# Patient Record
Sex: Male | Born: 1935
Health system: Southern US, Community
[De-identification: ages and names within clinical notes are randomized; demographics above are authoritative.]

## PROBLEM LIST (undated history)

## (undated) DIAGNOSIS — G629 Polyneuropathy, unspecified: Secondary | ICD-10-CM

## (undated) DIAGNOSIS — I440 Atrioventricular block, first degree: Secondary | ICD-10-CM

## (undated) DIAGNOSIS — I4891 Unspecified atrial fibrillation: Secondary | ICD-10-CM

## (undated) DIAGNOSIS — A0472 Enterocolitis due to Clostridium difficile, not specified as recurrent: Secondary | ICD-10-CM

## (undated) DIAGNOSIS — E785 Hyperlipidemia, unspecified: Secondary | ICD-10-CM

## (undated) DIAGNOSIS — I2510 Atherosclerotic heart disease of native coronary artery without angina pectoris: Secondary | ICD-10-CM

## (undated) DIAGNOSIS — E119 Type 2 diabetes mellitus without complications: Secondary | ICD-10-CM

## (undated) DIAGNOSIS — M109 Gout, unspecified: Secondary | ICD-10-CM

## (undated) DIAGNOSIS — I10 Essential (primary) hypertension: Secondary | ICD-10-CM

## (undated) DIAGNOSIS — I639 Cerebral infarction, unspecified: Secondary | ICD-10-CM

## (undated) HISTORY — DX: Hyperlipidemia, unspecified: E78.5

## (undated) HISTORY — PX: CORONARY ARTERY BYPASS GRAFT: SHX141

## (undated) HISTORY — DX: Gout, unspecified: M10.9

## (undated) HISTORY — DX: Atrioventricular block, first degree: I44.0

## (undated) HISTORY — PX: KIDNEY STONE SURGERY: SHX686

## (undated) HISTORY — PX: PARATHYROIDECTOMY: SHX19

## (undated) HISTORY — DX: Essential (primary) hypertension: I10

## (undated) HISTORY — DX: Unspecified atrial fibrillation: I48.91

## (undated) HISTORY — DX: Cerebral infarction, unspecified: I63.9

## (undated) HISTORY — PX: OTHER SURGICAL HISTORY: SHX169

## (undated) HISTORY — DX: Polyneuropathy, unspecified: G62.9

## (undated) HISTORY — DX: Type 2 diabetes mellitus without complications: E11.9

---
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
## 1997-11-04 ENCOUNTER — Encounter: Payer: Self-pay | Admitting: Emergency Medicine

## 1997-11-05 ENCOUNTER — Inpatient Hospital Stay (HOSPITAL_COMMUNITY): Admission: EM | Admit: 1997-11-05 | Discharge: 1997-11-05 | Payer: Self-pay | Admitting: Emergency Medicine

## 1997-11-15 ENCOUNTER — Encounter: Admission: RE | Admit: 1997-11-15 | Discharge: 1997-11-15 | Payer: Self-pay | Admitting: Family Medicine

## 2006-10-22 ENCOUNTER — Emergency Department (HOSPITAL_COMMUNITY): Admission: EM | Admit: 2006-10-22 | Discharge: 2006-10-22 | Payer: Self-pay | Admitting: Emergency Medicine

## 2009-08-12 ENCOUNTER — Emergency Department (HOSPITAL_COMMUNITY): Admission: EM | Admit: 2009-08-12 | Discharge: 2009-08-13 | Payer: Self-pay | Admitting: Emergency Medicine

## 2010-04-08 LAB — URINE CULTURE
Colony Count: NO GROWTH
Culture: NO GROWTH

## 2010-04-08 LAB — URINALYSIS, ROUTINE W REFLEX MICROSCOPIC
Bilirubin Urine: NEGATIVE
Glucose, UA: NEGATIVE mg/dL
Ketones, ur: NEGATIVE mg/dL
Leukocytes, UA: NEGATIVE
Nitrite: NEGATIVE
Specific Gravity, Urine: 1.015 (ref 1.005–1.030)
Urobilinogen, UA: 0.2 mg/dL (ref 0.0–1.0)
pH: 5.5 (ref 5.0–8.0)

## 2010-04-08 LAB — DIFFERENTIAL
Lymphocytes Relative: 3 % — ABNORMAL LOW (ref 12–46)
Lymphs Abs: 0.4 10*3/uL — ABNORMAL LOW (ref 0.7–4.0)
Neutrophils Relative %: 94 % — ABNORMAL HIGH (ref 43–77)

## 2010-04-08 LAB — URINE MICROSCOPIC-ADD ON

## 2010-04-08 LAB — CBC
MCV: 89.6 fL (ref 78.0–100.0)
Platelets: 115 10*3/uL — ABNORMAL LOW (ref 150–400)
RBC: 4.84 MIL/uL (ref 4.22–5.81)
WBC: 16.4 10*3/uL — ABNORMAL HIGH (ref 4.0–10.5)

## 2010-04-08 LAB — POCT CARDIAC MARKERS
CKMB, poc: <1 ng/mL — ABNORMAL LOW (ref 1.0–8.0)
Troponin i, poc: <0.05 ng/mL (ref 0.00–0.09)

## 2010-04-08 LAB — GLUCOSE, CAPILLARY: Glucose-Capillary: 226 mg/dL — ABNORMAL HIGH (ref 70–99)

## 2010-10-25 ENCOUNTER — Other Ambulatory Visit (HOSPITAL_COMMUNITY): Payer: Self-pay | Admitting: Nephrology

## 2010-10-25 DIAGNOSIS — N289 Disorder of kidney and ureter, unspecified: Secondary | ICD-10-CM

## 2010-11-20 ENCOUNTER — Ambulatory Visit (HOSPITAL_COMMUNITY)
Admission: RE | Admit: 2010-11-20 | Discharge: 2010-11-20 | Disposition: A | Discharge status: Home / Self Care | Payer: Medicare Other | Source: Ambulatory Visit | Attending: Nephrology | Admitting: Nephrology

## 2010-11-20 DIAGNOSIS — N289 Disorder of kidney and ureter, unspecified: Secondary | ICD-10-CM | POA: Insufficient documentation

## 2012-04-23 ENCOUNTER — Ambulatory Visit: Payer: Self-pay | Admitting: Family Medicine

## 2012-04-29 ENCOUNTER — Other Ambulatory Visit: Payer: Self-pay | Admitting: *Deleted

## 2012-04-29 MED ORDER — FUROSEMIDE 40 MG PO TABS: 40.0000 mg | ORAL_TABLET | Freq: Every day | ORAL | Status: DC

## 2012-05-05 ENCOUNTER — Ambulatory Visit: Payer: Self-pay | Admitting: Family Medicine

## 2012-05-15 ENCOUNTER — Ambulatory Visit: Payer: Self-pay | Admitting: Family Medicine

## 2012-05-28 ENCOUNTER — Telehealth: Payer: Self-pay | Admitting: Pharmacist

## 2012-05-28 MED ORDER — ROSUVASTATIN CALCIUM 20 MG PO TABS: 20.0000 mg | ORAL_TABLET | Freq: Every day | ORAL | Status: DC

## 2012-05-28 NOTE — Telephone Encounter (Signed)
Left samples for Crestor 20mg  take 1 tablet daily at front desk

## 2012-05-29 ENCOUNTER — Other Ambulatory Visit: Payer: Self-pay | Admitting: Family Medicine

## 2012-06-12 ENCOUNTER — Ambulatory Visit (INDEPENDENT_AMBULATORY_CARE_PROVIDER_SITE_OTHER): Payer: Medicare Other | Admitting: Family Medicine

## 2012-06-12 ENCOUNTER — Encounter: Payer: Self-pay | Admitting: Family Medicine

## 2012-06-12 VITALS — BP 100/60 | HR 60 | Temp 97.6°F | Ht 67.25 in | Wt 173.0 lb

## 2012-06-12 DIAGNOSIS — I10 Essential (primary) hypertension: Secondary | ICD-10-CM | POA: Insufficient documentation

## 2012-06-12 DIAGNOSIS — H9193 Unspecified hearing loss, bilateral: Secondary | ICD-10-CM

## 2012-06-12 DIAGNOSIS — G4762 Sleep related leg cramps: Secondary | ICD-10-CM

## 2012-06-12 DIAGNOSIS — E785 Hyperlipidemia, unspecified: Secondary | ICD-10-CM | POA: Insufficient documentation

## 2012-06-12 DIAGNOSIS — H9190 Unspecified hearing loss, unspecified ear: Secondary | ICD-10-CM

## 2012-06-12 DIAGNOSIS — IMO0001 Reserved for inherently not codable concepts without codable children: Secondary | ICD-10-CM

## 2012-06-12 DIAGNOSIS — E1065 Type 1 diabetes mellitus with hyperglycemia: Secondary | ICD-10-CM

## 2012-06-12 LAB — POCT CBC
HCT, POC: 40.8 % — AB (ref 43.5–53.7)
Lymph, poc: 1.5 (ref 0.6–3.4)
MCH, POC: 32.4 pg — AB (ref 27–31.2)
MCV: 92 fL (ref 80–97)
Platelet Count, POC: 133 10*3/uL — AB (ref 142–424)
RBC: 4.4 M/uL — AB (ref 4.69–6.13)

## 2012-06-12 LAB — COMPLETE METABOLIC PANEL WITH GFR
ALT: 10 U/L (ref 0–53)
AST: 13 U/L (ref 0–37)
Creat: 1.98 mg/dL — ABNORMAL HIGH (ref 0.50–1.35)
Total Bilirubin: 0.5 mg/dL (ref 0.3–1.2)

## 2012-06-12 NOTE — Patient Instructions (Addendum)
Continue therapeutic lifestyle changes. Continue current medications. Be careful with climbing so you don't fall. Arrange for an eye exam. Drinking plenty of fluids on a daily basis will help leg cramps at night. We will call you with the results of the labs once they are available. Dr. Lamar Laundry is an audiologist who comes next-door. He can be contacted to check your hearing

## 2012-06-12 NOTE — Progress Notes (Signed)
  Subjective:    Patient ID: Chad [name], male    DOB: 07-24-1931, 79 y.o.   MRN: [id number]  HPI This patient presents for recheck of multiple medical problems. His daughter accompanies the patient today.  Patient Active Problem List   Diagnosis Date Noted  . HTN (hypertension) 06/12/2012  .  hyperlipidemia 06/12/2012  . Diabetes mellitus, insulin dependent (IDDM), uncontrolled 06/12/2012    In addition, he complains of severe leg cramps at night.  The allergies, current medications, past medical history, surgical history, family and social history are reviewed.  Immunizations reviewed.  Health maintenance reviewed.  The following items are outstanding:  possibly the colonoscopy      Review of Systems  Constitutional: Negative.   HENT: Positive for hearing loss.   Eyes: Negative.   Respiratory: Negative.   Cardiovascular: Negative.   Gastrointestinal: Negative.   Endocrine: Negative.   Genitourinary: Negative.   Musculoskeletal: Negative.   Skin: Negative.   Allergic/Immunologic: Negative.   Neurological: Negative.   Hematological: Negative.   Psychiatric/Behavioral: Negative.        Objective:   Physical Exam BP 100/60  Pulse 60  Temp(Src) 97.6 F (36.4 C) (Oral)  Ht 5' 7.25" (1.708 m)  Wt 173 lb (78.472 kg)  BMI 26.9 kg/m2  The patient appeared well nourished and normally developed, alert, calm, and oriented to time and place. Speech, behavior and judgement appear normal. Vital signs as documented.  Head exam is unremarkable. No scleral icterus or pallor noted.  Neck is without jugular venous distension, thyromegally, or carotid bruits. Carotid upstrokes are brisk bilaterally. No cervical adenopathy. Lungs are clear anteriorly and posteriorly to auscultation. Normal respiratory effort. No axillary adenopathy Cardiac exam reveals regular rate and rhythm at 60 per minute. First and second heart sounds normal.  No murmurs, rubs or gallops.  Abdominal  exam reveals normal bowl sounds, no masses, no organomegaly and no aortic enlargement. No inguinal adenopathy. There is no tenderness. Extremities are nonedematous and both femoral and pedal pulses are normal. Mobility was good. Skin without pallor or jaundice.  Warm and dry, without rash. Neurologic exam reveals normal deep tendon reflexes and normal sensation. Diabetic foot exam was normal.          Assessment & Plan:  1. HTN (hypertension) - COMPLETE METABOLIC PANEL WITH GFR - NMR Lipoprofile with Lipids - Vitamin D 25 hydroxy - POCT glycosylated hemoglobin (Hb A1C) - POCT CBC  2.  hyperlipidemia - COMPLETE METABOLIC PANEL WITH GFR - NMR Lipoprofile with Lipids - Vitamin D 25 hydroxy - POCT glycosylated hemoglobin (Hb A1C) - POCT CBC  3. Diabetes mellitus, insulin dependent (IDDM), uncontrolled - COMPLETE METABOLIC PANEL WITH GFR - NMR Lipoprofile with Lipids - Vitamin D 25 hydroxy - POCT glycosylated hemoglobin (Hb A1C) - POCT CBC   4. Nocturnal leg cramps   5.diminished hearing    Patient Instructions  Continue therapeutic lifestyle changes. Continue current medications. Be careful with climbing so you don't fall. Arrange for an eye exam. Drinking plenty of fluids on a daily basis will help leg cramps at night. We will call you with the results of the labs once they are available. Dr. Lamar Laundry is an audiologist who comes next-door. He can be contacted to check your hearing

## 2012-06-13 LAB — VITAMIN D 25 HYDROXY (VIT D DEFICIENCY, FRACTURES): Vit D, 25-Hydroxy: 60 ng/mL (ref 30–89)

## 2012-06-17 LAB — NMR LIPOPROFILE WITH LIPIDS
Cholesterol, Total: 128 mg/dL (ref <200)
HDL Particle Number: 25.3 umol/L — ABNORMAL LOW (ref >=30.5)
HDL-C: 36 mg/dL — ABNORMAL LOW (ref >=40)
LDL (calc): 73 mg/dL (ref <100)
LDL Size: 20.3 nm — ABNORMAL LOW (ref >20.5)
LP-IR Score: 54 — ABNORMAL HIGH (ref <=45)

## 2012-07-10 ENCOUNTER — Other Ambulatory Visit (INDEPENDENT_AMBULATORY_CARE_PROVIDER_SITE_OTHER): Payer: Medicare Other

## 2012-07-10 DIAGNOSIS — R7989 Other specified abnormal findings of blood chemistry: Secondary | ICD-10-CM

## 2012-07-10 LAB — BASIC METABOLIC PANEL WITH GFR
BUN: 37 mg/dL — ABNORMAL HIGH (ref 6–23)
CO2: 27 mEq/L (ref 19–32)
Chloride: 104 mEq/L (ref 96–112)
Creat: 2.04 mg/dL — ABNORMAL HIGH (ref 0.50–1.35)
GFR, Est Non African American: 30 mL/min — ABNORMAL LOW
Glucose, Bld: 99 mg/dL (ref 70–99)

## 2012-08-13 ENCOUNTER — Other Ambulatory Visit: Payer: Self-pay | Admitting: Family Medicine

## 2012-08-15 ENCOUNTER — Other Ambulatory Visit: Payer: Self-pay | Admitting: Family Medicine

## 2012-08-25 ENCOUNTER — Telehealth: Payer: Self-pay | Admitting: Family Medicine

## 2012-08-25 NOTE — Telephone Encounter (Signed)
Left samples at front desk for crestor and levemir - patient's wife instructed to use levemir same as lantus - 40 units daily

## 2012-09-12 ENCOUNTER — Other Ambulatory Visit: Payer: Self-pay | Admitting: Family Medicine

## 2012-10-06 ENCOUNTER — Telehealth: Payer: Self-pay | Admitting: Pharmacist

## 2012-10-06 MED ORDER — ROSUVASTATIN CALCIUM 20 MG PO TABS: 20.0000 mg | ORAL_TABLET | Freq: Every day | ORAL | Status: DC

## 2012-10-06 MED ORDER — INSULIN GLARGINE 100 UNIT/ML ~~LOC~~ SOLN: 40.0000 [IU] | Freq: Every day | SUBCUTANEOUS | Status: DC

## 2012-10-06 NOTE — Telephone Encounter (Signed)
Samples left in lab refrig and at front desk.

## 2012-10-11 ENCOUNTER — Other Ambulatory Visit: Payer: Self-pay | Admitting: Family Medicine

## 2012-10-14 NOTE — Telephone Encounter (Signed)
This is okay to refill for 6 months 

## 2012-10-14 NOTE — Telephone Encounter (Signed)
Last seen DWM  06/12/12 

## 2012-10-14 NOTE — Telephone Encounter (Signed)
Last seen 06/12/12  DWM  If approved route to nurse and phone into [facility]

## 2012-10-15 NOTE — Telephone Encounter (Signed)
[facility] 

## 2012-10-22 ENCOUNTER — Ambulatory Visit: Payer: BC Managed Care – HMO | Admitting: Family Medicine

## 2012-10-28 ENCOUNTER — Other Ambulatory Visit: Payer: Self-pay | Admitting: Nurse Practitioner

## 2012-10-30 ENCOUNTER — Other Ambulatory Visit: Payer: Self-pay | Admitting: Pharmacist

## 2012-10-30 NOTE — Telephone Encounter (Signed)
TAMMY CAN YOU HELP WITH THIS PLEASE. I WANT TO MAKE SURE WE ORDER CORRECT QUANTITY AND DIRECTIONS. THANKS.

## 2012-11-12 ENCOUNTER — Ambulatory Visit (INDEPENDENT_AMBULATORY_CARE_PROVIDER_SITE_OTHER): Payer: Medicare Other

## 2012-11-12 DIAGNOSIS — Z23 Encounter for immunization: Secondary | ICD-10-CM

## 2012-11-13 ENCOUNTER — Encounter (INDEPENDENT_AMBULATORY_CARE_PROVIDER_SITE_OTHER): Payer: Self-pay

## 2012-11-13 ENCOUNTER — Encounter: Payer: Self-pay | Admitting: Family Medicine

## 2012-11-13 ENCOUNTER — Ambulatory Visit (INDEPENDENT_AMBULATORY_CARE_PROVIDER_SITE_OTHER): Payer: Medicare Other | Admitting: Family Medicine

## 2012-11-13 ENCOUNTER — Ambulatory Visit (INDEPENDENT_AMBULATORY_CARE_PROVIDER_SITE_OTHER): Payer: Medicare Other

## 2012-11-13 ENCOUNTER — Other Ambulatory Visit: Payer: Medicare Other

## 2012-11-13 VITALS — BP 105/58 | HR 66 | Temp 98.5°F | Ht 67.5 in | Wt 173.0 lb

## 2012-11-13 DIAGNOSIS — I10 Essential (primary) hypertension: Secondary | ICD-10-CM

## 2012-11-13 DIAGNOSIS — IMO0001 Reserved for inherently not codable concepts without codable children: Secondary | ICD-10-CM

## 2012-11-13 DIAGNOSIS — I2510 Atherosclerotic heart disease of native coronary artery without angina pectoris: Secondary | ICD-10-CM

## 2012-11-13 DIAGNOSIS — I7090 Unspecified atherosclerosis: Secondary | ICD-10-CM

## 2012-11-13 DIAGNOSIS — G4733 Obstructive sleep apnea (adult) (pediatric): Secondary | ICD-10-CM

## 2012-11-13 DIAGNOSIS — E785 Hyperlipidemia, unspecified: Secondary | ICD-10-CM

## 2012-11-13 DIAGNOSIS — E559 Vitamin D deficiency, unspecified: Secondary | ICD-10-CM

## 2012-11-13 DIAGNOSIS — Z79899 Other long term (current) drug therapy: Secondary | ICD-10-CM

## 2012-11-13 DIAGNOSIS — Z23 Encounter for immunization: Secondary | ICD-10-CM

## 2012-11-13 DIAGNOSIS — R5381 Other malaise: Secondary | ICD-10-CM

## 2012-11-13 DIAGNOSIS — E1065 Type 1 diabetes mellitus with hyperglycemia: Secondary | ICD-10-CM

## 2012-11-13 LAB — POCT GLYCOSYLATED HEMOGLOBIN (HGB A1C): Hemoglobin A1C: 7.5

## 2012-11-13 LAB — POCT CBC
Granulocyte percent: 71.5 %G (ref 37–80)
MPV: 8 fL (ref 0–99.8)
POC Granulocyte: 4.6 (ref 2–6.9)
Platelet Count, POC: 146 10*3/uL (ref 142–424)
RBC: 4.7 M/uL (ref 4.69–6.13)

## 2012-11-13 NOTE — Progress Notes (Signed)
Subjective:    Patient ID: Chad [name], male    DOB: 1931/05/22, 79 y.o.   MRN: [id number]  HPI Pt here for follow up and management of chronic medical problems. Has a history of multiple medical problems including those below plus ASCVD with a history of a CABG. He also has a history of a TIA  and elevated creatinine     Patient Active Problem List   Diagnosis Date Noted  . HTN (hypertension) 06/12/2012  .  hyperlipidemia 06/12/2012  . Diabetes mellitus, insulin dependent (IDDM), uncontrolled 06/12/2012   Outpatient Encounter Prescriptions as of 11/13/2012  Medication Sig Dispense Refill  . allopurinol (ZYLOPRIM) 100 MG tablet Take 100 mg by mouth daily.      Marland Kitchen aspirin 81 MG tablet Take 81 mg by mouth daily.      Marland Kitchen atenolol (TENORMIN) 50 MG tablet TAKE 1 TABLET BY MOUTH DAILY  30 tablet  3  . Cholecalciferol (VITAMIN D3) 3000 UNITS TABS Take 2,000 Units by mouth daily.      . fish oil-omega-3 fatty acids 1000 MG capsule Take 1 g by mouth daily.      . furosemide (LASIX) 40 MG tablet TAKE 1 TABLET (40 MG TOTAL) BY MOUTH DAILY.  30 tablet  3  . Insulin Glargine (LANTUS SOLOSTAR) 100 UNIT/ML SOPN Inject 40 Units into the skin at bedtime.  15 mL  2  . lisinopril (PRINIVIL,ZESTRIL) 40 MG tablet TAKE ONE TABLET BY MOUTH EVERY DAY  30 tablet  4  . LORazepam (ATIVAN) 2 MG tablet TAKE  (1)  TABLET  THREE TIMES DAILY AS NEEDED.  90 tablet  0  . ONE TOUCH ULTRA TEST test strip USE TO CHECK BLOOD SUGAR 2 TO 3 TIMES DAILY  100 each  2  . rosuvastatin (CRESTOR) 20 MG tablet Take 1 tablet (20 mg total) by mouth daily.  42 tablet  0   No facility-administered encounter medications on file as of 11/13/2012.    Review of Systems  Constitutional: Negative.   HENT: Negative.   Eyes: Negative.   Respiratory: Negative.   Cardiovascular: Negative.   Gastrointestinal: Negative.   Endocrine: Negative.   Genitourinary: Negative.   Musculoskeletal: Negative.   Skin: Negative.    Allergic/Immunologic: Negative.   Neurological: Negative.   Hematological: Negative.   Psychiatric/Behavioral: Negative.        Objective:   Physical Exam  Nursing note and vitals reviewed. Constitutional: He is oriented to person, place, and time. He appears well-developed and well-nourished. No distress.  Pleasant and cooperative  HENT:  Head: Normocephalic and atraumatic.  Right Ear: External ear normal.  Left Ear: External ear normal.  Nose: Nose normal.  Mouth/Throat: Oropharynx is clear and moist. No oropharyngeal exudate.  Wears upper and lower dentures  Eyes: Conjunctivae and EOM are normal. Pupils are equal, round, and reactive to light. Right eye exhibits no discharge. Left eye exhibits no discharge. No scleral icterus.  Neck: Normal range of motion. Neck supple. No tracheal deviation present. No thyromegaly present.  Cardiovascular: Normal rate, regular rhythm, normal heart sounds and intact distal pulses.  Exam reveals no gallop and no friction rub.   No murmur heard. At 72 per minute  Pulmonary/Chest: Effort normal and breath sounds normal. No respiratory distress. He has no wheezes. He has no rales. He exhibits no tenderness.  Abdominal: Soft. Bowel sounds are normal. He exhibits no mass. There is no tenderness. There is no rebound and no guarding.  Musculoskeletal: Normal range of  motion. He exhibits no edema and no tenderness.  Varicose veins on both lower extremity  Lymphadenopathy:    He has no cervical adenopathy.  Neurological: He is alert and oriented to person, place, and time. He has normal reflexes.  Skin: Skin is warm and dry. No rash noted. No erythema. No pallor.  Psychiatric: He has a normal mood and affect. His behavior is normal. Judgment and thought content normal.   BP 105/58  Pulse 66  Temp(Src) 98.5 F (36.9 C) (Oral)  Ht 5' 7.5" (1.715 m)  Wt 173 lb (78.472 kg)  BMI 26.68 kg/m2 Diabetic foot exam was done today  Results for orders placed  in visit on 11/13/12  POCT CBC      Result Value Range   WBC 6.4  4.6 - 10.2 K/uL   Lymph, poc 1.4  0.6 - 3.4   POC LYMPH PERCENT 22.6  10 - 50 %L   MID (cbc)    0 - 0.9   POC MID %    0 - 12 %M   POC Granulocyte 4.6  2 - 6.9   Granulocyte percent 71.5  37 - 80 %G   RBC 4.7  4.69 - 6.13 M/uL   Hemoglobin 13.8 (*) 14.1 - 18.1 g/dL   HCT, POC 16.1 (*) 09.6 - 53.7 %   MCV 91.6  80 - 97 fL   MCH, POC 29.5  27 - 31.2 pg   MCHC 32.3  31.8 - 35.4 g/dL   RDW, POC 04.5     Platelet Count, POC 146.0  142 - 424 K/uL   MPV 8.0  0 - 99.8 fL  POCT GLYCOSYLATED HEMOGLOBIN (HGB A1C)      Result Value Range   Hemoglobin A1C 7.5%      [facility] reading (PRIMARY) by  Dr. Christell Constant: Chest x-ray--  degenerative changes and atherosclerotic aorta                                      Assessment & Plan:   1.  hyperlipidemia   2. Diabetes mellitus, insulin dependent (IDDM), uncontrolled   3. HTN (hypertension)   4. ASCVD (arteriosclerotic cardiovascular disease)   5. Obstructive sleep apnea   6. Need for pneumococcal vaccination    Orders Placed This Encounter  Procedures  . DG Chest 2 View    Standing Status: Future     Number of Occurrences: 1     Standing Expiration Date: 01/13/2014    Order Specific Question:  Reason for Exam (SYMPTOM  OR DIAGNOSIS REQUIRED)    Answer:  htn    Order Specific Question:  Preferred imaging location?    Answer:  Internal  . Pneumococcal conjugate vaccine 13-valent less than 5yo IM   No orders of the defined types were placed in this encounter.   Patient Instructions  Continue current medications. Continue good therapeutic lifestyle changes.  Fall precautions discussed with patient. Follow up as planned and earlier as needed.  Patient needs to do better with blood sugar control and exercise because of the hemoglobin A1c which was 7.5%. This number should be less than 7%. We will call you with the remainder of the lab work results once available We will call  you with the results of the chest x-ray once available You will receive your Prevnar vaccine today She was informed today of his CBC and hemoglobin A1c result  Arrie Senate MD

## 2012-11-13 NOTE — Patient Instructions (Addendum)
Continue current medications. Continue good therapeutic lifestyle changes.  Fall precautions discussed with patient. Follow up as planned and earlier as needed.  Patient needs to do better with blood sugar control and exercise because of the hemoglobin A1c which was 7.5%. This number should be less than 7%. We will call you with the remainder of the lab work results once available We will call you with the results of the chest x-ray once available You will receive your Prevnar vaccine today She was informed today of his CBC and hemoglobin A1c resultPneumococcal Vaccine, Polyvalent suspension for injection What is this medicine? PNEUMOCOCCAL VACCINE, POLYVALENT (NEU mo KOK al vak SEEN, pol ee VEY luhnt) is a vaccine to prevent pneumococcus bacteria infection. These bacteria are a major cause of ear infections, 'Strep throat' infections, and serious pneumonia, meningitis, or blood infections worldwide. These vaccines help the body to produce antibodies (protective substances) that help your body defend against these bacteria. This vaccine is recommended for infants and young children. This vaccine will not treat an infection. This medicine may be used for other purposes; ask your health care provider or pharmacist if you have questions. What should I tell my health care provider before I take this medicine? They need to know if you have any of these conditions: -bleeding problems -fever -immune system problems -low platelet count in the blood -seizures -an unusual or allergic reaction to pneumococcal vaccine, diphtheria toxoid, other vaccines, latex, other medicines, foods, dyes, or preservatives -pregnant or trying to get pregnant -breast-feeding How should I use this medicine? This vaccine is for injection into a muscle. It is given by a health care professional. A copy of Vaccine Information Statements will be given before each vaccination. Read this sheet carefully each time. The sheet may  change frequently. Talk to your pediatrician regarding the use of this medicine in children. While this drug may be prescribed for children as young as 72 weeks old for selected conditions, precautions do apply. Overdosage: If you think you have taken too much of this medicine contact a poison control center or emergency room at once. NOTE: This medicine is only for you. Do not share this medicine with others. What if I miss a dose? It is important not to miss your dose. Call your doctor or health care professional if you are unable to keep an appointment. What may interact with this medicine? -medicines for cancer chemotherapy -medicines that suppress your immune function -medicines that treat or prevent blood clots like warfarin, enoxaparin, and dalteparin -steroid medicines like prednisone or cortisone This list may not describe all possible interactions. Give your health care provider a list of all the medicines, herbs, non-prescription drugs, or dietary supplements you use. Also tell them if you smoke, drink alcohol, or use illegal drugs. Some items may interact with your medicine. What should I watch for while using this medicine? Mild fever and pain should go away in 3 days or less. Report any unusual symptoms to your doctor or health care professional. What side effects may I notice from receiving this medicine? Side effects that you should report to your doctor or health care professional as soon as possible: -allergic reactions like skin rash, itching or hives, swelling of the face, lips, or tongue -breathing problems -confused -fever over 102 degrees F -pain, tingling, numbness in the hands or feet -seizures -unusual bleeding or bruising -unusual muscle weakness Side effects that usually do not require medical attention (report to your doctor or health care professional if they  continue or are bothersome): -aches and pains -diarrhea -fever of 102 degrees F or  less -headache -irritable -loss of appetite -pain, tender at site where injected -trouble sleeping This list may not describe all possible side effects. Call your doctor for medical advice about side effects. You may report side effects to FDA at 1-800-FDA-1088. Where should I keep my medicine? This does not apply. This vaccine is given in a clinic, pharmacy, doctor's office, or other health care setting and will not be stored at home. NOTE: This sheet is a summary. It may not cover all possible information. If you have questions about this medicine, talk to your doctor, pharmacist, or health care provider.  2013, [employer]. (03/23/2008 10:17:22 AM)

## 2012-11-13 NOTE — Progress Notes (Signed)
prevnar given-pt tolerated well.

## 2012-11-15 LAB — NMR, LIPOPROFILE
Cholesterol: 116 mg/dL (ref <200)
HDL Cholesterol by NMR: 35 mg/dL — ABNORMAL LOW (ref >=40)
HDL Particle Number: 23.7 umol/L — ABNORMAL LOW (ref >=30.5)
LDL Particle Number: 945 nmol/L (ref <1000)
LDLC SERPL CALC-MCNC: 65 mg/dL (ref <100)

## 2012-11-15 LAB — BMP8+EGFR
BUN: 29 mg/dL — ABNORMAL HIGH (ref 8–27)
CO2: 24 mmol/L (ref 18–29)
Chloride: 99 mmol/L (ref 97–108)
Glucose: 127 mg/dL — ABNORMAL HIGH (ref 65–99)
Potassium: 4.9 mmol/L (ref 3.5–5.2)

## 2012-11-15 LAB — HEPATIC FUNCTION PANEL
Albumin: 4.1 g/dL (ref 3.5–4.8)
Total Protein: 6.2 g/dL (ref 6.0–8.5)

## 2012-11-15 LAB — VITAMIN D 25 HYDROXY (VIT D DEFICIENCY, FRACTURES): Vit D, 25-Hydroxy: 67.9 ng/mL (ref 30.0–100.0)

## 2012-12-05 ENCOUNTER — Telehealth: Payer: Self-pay | Admitting: Family Medicine

## 2012-12-05 NOTE — Telephone Encounter (Signed)
Patient advised not to use ibuprofen due to kidney functions being elevated but to try tylenol and if that doesn't help to call us back on Monday.

## 2012-12-08 ENCOUNTER — Telehealth: Payer: Self-pay | Admitting: Family Medicine

## 2012-12-09 ENCOUNTER — Encounter: Payer: Self-pay | Admitting: Family Medicine

## 2012-12-09 ENCOUNTER — Ambulatory Visit (INDEPENDENT_AMBULATORY_CARE_PROVIDER_SITE_OTHER): Payer: Medicare Other | Admitting: Family Medicine

## 2012-12-09 ENCOUNTER — Ambulatory Visit (INDEPENDENT_AMBULATORY_CARE_PROVIDER_SITE_OTHER): Payer: Medicare Other

## 2012-12-09 VITALS — BP 124/68 | HR 68 | Temp 96.5°F | Ht 67.5 in | Wt 173.0 lb

## 2012-12-09 DIAGNOSIS — M25559 Pain in unspecified hip: Secondary | ICD-10-CM

## 2012-12-09 DIAGNOSIS — M25551 Pain in right hip: Secondary | ICD-10-CM

## 2012-12-09 NOTE — Progress Notes (Signed)
Subjective:    Patient ID: Chad [name], male    DOB: 05-08-1931, 79 y.o.   MRN: [id number]  HPI Patient here today for right hip pain, pain radiates down right leg. Patient comes to the visit today with his daughter, Chad [name]. He first noticed this pain in the right hip to 3 weeks ago when he was sitting working on some kind of project. Since that time has been hurting especially with arising in the morning and beginning to walk and with arising from sitting positions and beginning to move. He says he cannot find a spot that is point tender. He does not recall any history of any injury. He indicates that the pain seems to get better with walking.     Patient Active Problem List   Diagnosis Date Noted  . ASCVD (arteriosclerotic cardiovascular disease) 11/13/2012  . Obstructive sleep apnea 11/13/2012  . HTN (hypertension) 06/12/2012  .  hyperlipidemia 06/12/2012  . Diabetes mellitus, insulin dependent (IDDM), uncontrolled 06/12/2012   Outpatient Encounter Prescriptions as of 12/09/2012  Medication Sig  . allopurinol (ZYLOPRIM) 100 MG tablet Take 100 mg by mouth daily.  Marland Kitchen aspirin 81 MG tablet Take 81 mg by mouth daily.  Marland Kitchen atenolol (TENORMIN) 50 MG tablet TAKE 1 TABLET BY MOUTH DAILY  . Cholecalciferol (VITAMIN D3) 3000 UNITS TABS Take 2,000 Units by mouth daily.  . fish oil-omega-3 fatty acids 1000 MG capsule Take 1 g by mouth daily.  . furosemide (LASIX) 40 MG tablet TAKE 1 TABLET (40 MG TOTAL) BY MOUTH DAILY.  Marland Kitchen Insulin Glargine (LANTUS SOLOSTAR) 100 UNIT/ML SOPN Inject 40 Units into the skin at bedtime.  Marland Kitchen lisinopril (PRINIVIL,ZESTRIL) 40 MG tablet TAKE ONE TABLET BY MOUTH EVERY DAY  . LORazepam (ATIVAN) 2 MG tablet TAKE  (1)  TABLET  THREE TIMES DAILY AS NEEDED.  Marland Kitchen ONE TOUCH ULTRA TEST test strip USE TO CHECK BLOOD SUGAR 2 TO 3 TIMES DAILY  . rosuvastatin (CRESTOR) 20 MG tablet Take 1 tablet (20 mg total) by mouth daily.    Review of Systems  Constitutional: Negative.   HENT:  Negative.   Eyes: Negative.   Respiratory: Negative.   Cardiovascular: Negative.   Gastrointestinal: Negative.   Endocrine: Negative.   Genitourinary: Negative.   Musculoskeletal: Positive for arthralgias (right hip( pain).  Skin: Negative.   Allergic/Immunologic: Negative.   Neurological: Negative.   Hematological: Negative.   Psychiatric/Behavioral: Negative.        Objective:   Physical Exam  Nursing note and vitals reviewed. Constitutional: He is oriented to person, place, and time. He appears well-developed and well-nourished. No distress.  HENT:  Head: Normocephalic.  Neck: Normal range of motion.  Musculoskeletal: Normal range of motion. He exhibits no edema and no tenderness.  No plain tenderness about the hip joint. Fairly good range of motion.  Neurological: He is alert and oriented to person, place, and time. He has normal reflexes.  Skin: Skin is warm and dry. No rash noted.  Psychiatric: He has a normal mood and affect. His behavior is normal. Judgment and thought content normal.   BP 124/68  Pulse 68  Temp(Src) 96.5 F (35.8 C) (Oral)  Ht 5' 7.5" (1.715 m)  Wt 173 lb (78.472 kg)  BMI 26.68 kg/m2  [facility] reading (PRIMARY) by  Dr.Cordelia Bessinger--degenerative changes right hip  Recent lab work reviewed indicates he has an elevated creatinine which had improved at the last visit. And of course he also has diabetes. This will limit use of steroids and anti-inflammatory medicine.     Assessment & Plan:   1. Right hip pain    Patient Instructions               Hip Pain The hips join the upper legs to the lower pelvis. The bones, cartilage, tendons, and muscles of the hip joint perform a lot of work each day holding your body weight and allowing you to move around. Hip pain is a common symptom. It can range from a minor ache to severe pain on 1 or both hips. Pain may be felt on the inside of the hip joint near the groin, or the  outside near the buttocks and upper thigh. There may be swelling or stiffness as well. It occurs more often when a person walks or performs activity. There are many reasons hip pain can develop. CAUSES  It is important to work with your caregiver to identify the cause since many conditions can impact the bones, cartilage, muscles, and tendons of the hips. Causes for hip pain include:  Broken (fractured) bones.  Separation of the thighbone from the hip socket (dislocation).  Torn cartilage of the hip joint.  Swelling (inflammation) of a tendon (tendonitis), the sac within the hip joint (bursitis), or a joint.  A weakening in the abdominal wall (hernia), affecting the nerves to the hip.  Arthritis in the hip joint or lining of the hip joint.  Pinched nerves in the back, hip, or upper thigh.  A bulging disc in the spine (herniated disc).  Rarely, bone infection or cancer. DIAGNOSIS  The location of your hip pain will help your caregiver understand what may be causing the pain. A diagnosis is based on your medical history, your symptoms, results from your physical exam, and results from diagnostic tests. Diagnostic tests may include X-ray exams, a computerized magnetic scan (magnetic resonance imaging, MRI), or bone scan. TREATMENT  Treatment will depend on the cause of your hip pain. Treatment may include:  Limiting activities and resting until symptoms improve.  Crutches or other walking supports (a cane or brace).  Ice, elevation, and compression.  Physical therapy or home exercises.  Shoe inserts or special shoes.  Losing weight.  Medications to reduce pain.  Undergoing surgery. HOME CARE INSTRUCTIONS   Only take over-the-counter or prescription medicines for pain, discomfort, or fever as directed by your caregiver.  Put ice on the injured area:  Put ice in a plastic bag.  Place a towel between your skin and the bag.  Leave the ice on for 15-20 minutes at a time,  03-04 times a day.  Keep your leg raised (elevated) when possible to lessen swelling.  Avoid activities that cause pain.  Follow specific exercises as directed by your caregiver.  Sleep with a pillow between your legs on your most comfortable side.  Record how often you have hip pain, the location of the pain, and what it feels like. This information may be helpful to you and your caregiver.  Ask your caregiver about returning to work or sports and whether you should drive.  Follow up with your caregiver for further exams, therapy, or testing as directed. SEEK MEDICAL CARE IF:   Your pain or swelling continues or worsens after 1 week.  You are feeling unwell or have chills.  You have increasing  difficulty with walking.  You have a loss of sensation or other new symptoms.  You have questions or concerns. SEEK IMMEDIATE MEDICAL CARE IF:   You cannot put weight on the affected hip.  You have fallen.  You have a sudden increase in pain and swelling in your hip.  You have a fever. MAKE SURE YOU:   Understand these instructions.  Will watch your condition.  Will get help right away if you are not doing well or get worse. Document Released: 06/28/2009 Document Revised: 04/02/2011 Document Reviewed: 06/28/2009 [facility] Patient Information 2014 [place], [place].  Take extra strength Tylenol as needed for pain Use warm wet compresses 20 minutes 3 or 4 times daily Be careful and did not calm and did not put yourself at risk for falling Call back in one week if not better we will arrange for physical therapy next door Do not use a heating pad   Nyra Capes MD

## 2012-12-09 NOTE — Patient Instructions (Addendum)
Hip Pain The hips join the upper legs to the lower pelvis. The bones, cartilage, tendons, and muscles of the hip joint perform a lot of work each day holding your body weight and allowing you to move around. Hip pain is a common symptom. It can range from a minor ache to severe pain on 1 or both hips. Pain may be felt on the inside of the hip joint near the groin, or the outside near the buttocks and upper thigh. There may be swelling or stiffness as well. It occurs more often when a person walks or performs activity. There are many reasons hip pain can develop. CAUSES  It is important to work with your caregiver to identify the cause since many conditions can impact the bones, cartilage, muscles, and tendons of the hips. Causes for hip pain include:  Broken (fractured) bones.  Separation of the thighbone from the hip socket (dislocation).  Torn cartilage of the hip joint.  Swelling (inflammation) of a tendon (tendonitis), the sac within the hip joint (bursitis), or a joint.  A weakening in the abdominal wall (hernia), affecting the nerves to the hip.  Arthritis in the hip joint or lining of the hip joint.  Pinched nerves in the back, hip, or upper thigh.  A bulging disc in the spine (herniated disc).  Rarely, bone infection or cancer. DIAGNOSIS  The location of your hip pain will help your caregiver understand what may be causing the pain. A diagnosis is based on your medical history, your symptoms, results from your physical exam, and results from diagnostic tests. Diagnostic tests may include X-ray exams, a computerized magnetic scan (magnetic resonance imaging, MRI), or bone scan. TREATMENT  Treatment will depend on the cause of your hip pain. Treatment may include:  Limiting activities and resting until symptoms improve.  Crutches or other walking supports (a cane or brace).  Ice, elevation, and compression.  Physical therapy or home exercises.  Shoe  inserts or special shoes.  Losing weight.  Medications to reduce pain.  Undergoing surgery. HOME CARE INSTRUCTIONS   Only take over-the-counter or prescription medicines for pain, discomfort, or fever as directed by your caregiver.  Put ice on the injured area:  Put ice in a plastic bag.  Place a towel between your skin and the bag.  Leave the ice on for 15-20 minutes at a time, 03-04 times a day.  Keep your leg raised (elevated) when possible to lessen swelling.  Avoid activities that cause pain.  Follow specific exercises as directed by your caregiver.  Sleep with a pillow between your legs on your most comfortable side.  Record how often you have hip pain, the location of the pain, and what it feels like. This information may be helpful to you and your caregiver.  Ask your caregiver about returning to work or sports and whether you should drive.  Follow up with your caregiver for further exams, therapy, or testing as directed. SEEK MEDICAL CARE IF:   Your pain or swelling continues or worsens after 1 week.  You are feeling unwell or have chills.  You have increasing difficulty with walking.  You have a loss of sensation or other new symptoms.  You have questions or concerns. SEEK IMMEDIATE MEDICAL CARE IF:   You cannot put weight on the affected hip.  You have fallen.  You have a sudden increase in pain and swelling in your hip.  You  have a fever. MAKE SURE YOU:   Understand these instructions.  Will watch your condition.  Will get help right away if you are not doing well or get worse. Document Released: 06/28/2009 Document Revised: 04/02/2011 Document Reviewed: 06/28/2009 [facility] Patient Information 2014 [place], [place].  Take extra strength Tylenol as needed for pain Use warm wet compresses 20 minutes 3 or 4 times daily Be careful and did not calm and did not put yourself at risk for falling Call back in one week if not better we will arrange  for physical therapy next door Do not use a heating pad

## 2012-12-12 ENCOUNTER — Other Ambulatory Visit: Payer: Self-pay | Admitting: Nurse Practitioner

## 2012-12-17 ENCOUNTER — Telehealth: Payer: Self-pay | Admitting: Family Medicine

## 2012-12-17 NOTE — Telephone Encounter (Signed)
FYI

## 2012-12-22 NOTE — Telephone Encounter (Signed)
Patient's glucometer just needed battery replaced.  They took to [facility] last week and had changed.

## 2013-01-05 ENCOUNTER — Telehealth: Payer: Self-pay | Admitting: Family Medicine

## 2013-01-05 NOTE — Telephone Encounter (Signed)
Patient want samples of insulin pen needles if available - left #15 up front.

## 2013-01-05 NOTE — Telephone Encounter (Signed)
Called patient - he did not have question about glucometer this question was taken care of but he would like to see if we have any samples of Lantus or Crestor. #1 Levemir pen left at front desk - explained to take same as lantus #28 Crestor 10mg  left ast front desk - explained ot take 2 tablets to equal 20mg  daily.

## 2013-01-09 ENCOUNTER — Other Ambulatory Visit: Payer: Self-pay | Admitting: Family Medicine

## 2013-02-10 NOTE — Progress Notes (Signed)
Pt came in for labs only 

## 2013-02-18 ENCOUNTER — Telehealth: Payer: Self-pay | Admitting: Pharmacist

## 2013-02-18 NOTE — Telephone Encounter (Signed)
Left #1 lantus pen and #28 Crestor 20mg  tablet samples for patietn to pick up - pt notified

## 2013-03-09 ENCOUNTER — Telehealth: Payer: Self-pay | Admitting: Family Medicine

## 2013-03-09 ENCOUNTER — Other Ambulatory Visit: Payer: Self-pay | Admitting: *Deleted

## 2013-03-09 MED ORDER — GLUCOSE BLOOD VI STRP: ORAL_STRIP | Status: DC

## 2013-03-09 NOTE — Telephone Encounter (Signed)
Daughter aware- med sent

## 2013-03-16 ENCOUNTER — Ambulatory Visit: Payer: Medicare Other | Admitting: Family Medicine

## 2013-03-27 NOTE — Telephone Encounter (Signed)
JAMIE B TOOK CARE OF PT.Marland Kitchen..Marland Kitchen

## 2013-04-06 ENCOUNTER — Telehealth: Payer: Self-pay | Admitting: Pharmacist

## 2013-04-06 NOTE — Telephone Encounter (Signed)
No lantus sample available.  #28 crestor 20mg  left at front desk.  Patient's wife notifed.

## 2013-04-13 ENCOUNTER — Encounter: Payer: Self-pay | Admitting: Family Medicine

## 2013-04-13 ENCOUNTER — Ambulatory Visit (INDEPENDENT_AMBULATORY_CARE_PROVIDER_SITE_OTHER): Payer: Medicare Other | Admitting: Family Medicine

## 2013-04-13 VITALS — BP 103/58 | HR 72 | Temp 96.7°F | Ht 67.5 in | Wt 172.0 lb

## 2013-04-13 DIAGNOSIS — E119 Type 2 diabetes mellitus without complications: Secondary | ICD-10-CM

## 2013-04-13 DIAGNOSIS — E1065 Type 1 diabetes mellitus with hyperglycemia: Secondary | ICD-10-CM

## 2013-04-13 DIAGNOSIS — IMO0002 Reserved for concepts with insufficient information to code with codable children: Secondary | ICD-10-CM

## 2013-04-13 DIAGNOSIS — IMO0001 Reserved for inherently not codable concepts without codable children: Secondary | ICD-10-CM

## 2013-04-13 DIAGNOSIS — Z794 Long term (current) use of insulin: Secondary | ICD-10-CM

## 2013-04-13 DIAGNOSIS — E559 Vitamin D deficiency, unspecified: Secondary | ICD-10-CM

## 2013-04-13 DIAGNOSIS — E1165 Type 2 diabetes mellitus with hyperglycemia: Secondary | ICD-10-CM

## 2013-04-13 DIAGNOSIS — I10 Essential (primary) hypertension: Secondary | ICD-10-CM

## 2013-04-13 DIAGNOSIS — R252 Cramp and spasm: Secondary | ICD-10-CM

## 2013-04-13 DIAGNOSIS — R5383 Other fatigue: Secondary | ICD-10-CM

## 2013-04-13 DIAGNOSIS — R5381 Other malaise: Secondary | ICD-10-CM

## 2013-04-13 DIAGNOSIS — N400 Enlarged prostate without lower urinary tract symptoms: Secondary | ICD-10-CM

## 2013-04-13 DIAGNOSIS — I7090 Unspecified atherosclerosis: Secondary | ICD-10-CM

## 2013-04-13 DIAGNOSIS — I2510 Atherosclerotic heart disease of native coronary artery without angina pectoris: Secondary | ICD-10-CM

## 2013-04-13 DIAGNOSIS — E785 Hyperlipidemia, unspecified: Secondary | ICD-10-CM

## 2013-04-13 DIAGNOSIS — M79609 Pain in unspecified limb: Secondary | ICD-10-CM

## 2013-04-13 LAB — POCT CBC
Granulocyte percent: 66.9 %G (ref 37–80)
HCT, POC: 42.3 % — AB (ref 43.5–53.7)
HEMOGLOBIN: 13 g/dL — AB (ref 14.1–18.1)
Lymph, poc: 1.6 (ref 0.6–3.4)
MCH, POC: 29.8 pg (ref 27–31.2)
MCHC: 30.8 g/dL — AB (ref 31.8–35.4)
MCV: 97 fL (ref 80–97)
MPV: 9 fL (ref 0–99.8)
PLATELET COUNT, POC: 124 10*3/uL — AB (ref 142–424)
POC GRANULOCYTE: 4.3 (ref 2–6.9)
POC LYMPH %: 25.4 % (ref 10–50)
RBC: 4.4 M/uL — AB (ref 4.69–6.13)
RDW, POC: 16 %
WBC: 6.4 10*3/uL (ref 4.6–10.2)

## 2013-04-13 LAB — POCT UA - MICROALBUMIN: Microalbumin Ur, POC: POSITIVE mg/L

## 2013-04-13 LAB — POCT URINALYSIS DIPSTICK
Bilirubin, UA: NEGATIVE
Glucose, UA: NEGATIVE
Ketones, UA: NEGATIVE
LEUKOCYTES UA: NEGATIVE
Nitrite, UA: NEGATIVE
PROTEIN UA: NEGATIVE
Spec Grav, UA: 1.015
UROBILINOGEN UA: NEGATIVE
pH, UA: 5

## 2013-04-13 LAB — POCT UA - MICROSCOPIC ONLY
Bacteria, U Microscopic: NEGATIVE
CASTS, UR, LPF, POC: NEGATIVE
Crystals, Ur, HPF, POC: NEGATIVE
Mucus, UA: NEGATIVE
WBC, Ur, HPF, POC: NEGATIVE
YEAST UA: NEGATIVE

## 2013-04-13 LAB — POCT GLYCOSYLATED HEMOGLOBIN (HGB A1C): Hemoglobin A1C: 6.8

## 2013-04-13 NOTE — Progress Notes (Signed)
Subjective:    Patient ID: Chad [name], male    DOB: May 12, 1931, 80 y.o.   MRN: [id number]  HPI Pt here for follow up and management of chronic medical problems. Chad [name] comes today for recheck. As usual he has few complaints. He does complain of some leg cramps. He is to proceed a rectal exam. He is also going to be given an FOBT to return. He will have lab work drawn today. His hemoglobin A1c today is 6.8% and he will be informed of this before he leaves the office.         Patient Active Problem List   Diagnosis Date Noted  . ASCVD (arteriosclerotic cardiovascular disease) 11/13/2012  . Obstructive sleep apnea 11/13/2012  . HTN (hypertension) 06/12/2012  .  hyperlipidemia 06/12/2012  . Diabetes mellitus, insulin dependent (IDDM), uncontrolled 06/12/2012   Outpatient Encounter Prescriptions as of 04/13/2013  Medication Sig  . allopurinol (ZYLOPRIM) 100 MG tablet Take 100 mg by mouth daily.  Marland Kitchen aspirin 81 MG tablet Take 81 mg by mouth daily.  Marland Kitchen atenolol (TENORMIN) 50 MG tablet TAKE 1 TABLET BY MOUTH DAILY  . Cholecalciferol (VITAMIN D3) 3000 UNITS TABS Take 2,000 Units by mouth daily.  . fish oil-omega-3 fatty acids 1000 MG capsule Take 1 g by mouth daily.  . furosemide (LASIX) 40 MG tablet TAKE 1 TABLET (40 MG TOTAL) BY MOUTH DAILY.  Marland Kitchen glucose blood (ONE TOUCH ULTRA TEST) test strip USE TO CHECK BLOOD SUGAR 2 TO 3 TIMES DAILY. Dx- 250.0  . Insulin Glargine (LANTUS SOLOSTAR) 100 UNIT/ML SOPN Inject 40 Units into the skin at bedtime.  Marland Kitchen lisinopril (PRINIVIL,ZESTRIL) 40 MG tablet TAKE ONE TABLET BY MOUTH EVERY DAY  . LORazepam (ATIVAN) 2 MG tablet TAKE  (1)  TABLET  THREE TIMES DAILY AS NEEDED.  Marland Kitchen rosuvastatin (CRESTOR) 20 MG tablet Take 1 tablet (20 mg total) by mouth daily.    Review of Systems  Constitutional: Negative.   HENT: Negative.   Eyes: Negative.   Respiratory: Negative.   Cardiovascular: Negative.   Gastrointestinal: Negative.   Endocrine: Negative.     Genitourinary: Negative.   Musculoskeletal: Negative.        Leg cramps often  Skin: Negative.   Allergic/Immunologic: Negative.   Neurological: Negative.   Hematological: Negative.   Psychiatric/Behavioral: Negative.        Objective:   Physical Exam  Nursing note and vitals reviewed. Constitutional: He is oriented to person, place, and time. He appears well-developed and well-nourished. No distress.  Pleasant and cooperative  HENT:  Head: Normocephalic and atraumatic.  Right Ear: External ear normal.  Left Ear: External ear normal.  Nose: Nose normal.  Mouth/Throat: Oropharynx is clear and moist. No oropharyngeal exudate.  Eyes: Conjunctivae and EOM are normal. Pupils are equal, round, and reactive to light. Right eye exhibits no discharge. Left eye exhibits no discharge. No scleral icterus.  Neck: Normal range of motion. Neck supple. No tracheal deviation present. No thyromegaly present.  No carotid bruits  Cardiovascular: Normal rate, regular rhythm, normal heart sounds and intact distal pulses.  Exam reveals no gallop and no friction rub.   No murmur heard. At 72 per minute  Pulmonary/Chest: Effort normal and breath sounds normal. No respiratory distress. He has no wheezes. He has no rales. He exhibits no tenderness.  No axillary adenopathy  Abdominal: Soft. Bowel sounds are normal. He exhibits no mass. There is no tenderness. There is no rebound and no guarding.  No inguinal adenopathy  Genitourinary: Rectum normal and penis normal.  The prostate was enlarged but soft and smooth. There were no rectal masses. There is no inguinal hernia or inguinal nodes palpated. External genitalia were within normal limits.  Musculoskeletal: Normal range of motion. He exhibits no edema and no tenderness.  Prominent lower extremity varicosities. Specifically good pedal pulses  Lymphadenopathy:    He has no cervical adenopathy.  Neurological: He is alert and oriented to person, place, and  time. He has normal reflexes. No cranial nerve deficit.  Skin: Skin is warm and dry. No rash noted. No erythema. No pallor.  Psychiatric: He has a normal mood and affect. His behavior is normal. Judgment and thought content normal.   BP 103/58  Pulse 72  Temp(Src) 96.7 F (35.9 C) (Oral)  Ht 5' 7.5" (1.715 m)  Wt 172 lb (78.019 kg)  BMI 26.53 kg/m2        Assessment & Plan:  1. Type II or unspecified type diabetes mellitus without mention of complication, not stated as uncontrolled - POCT CBC - POCT urinalysis dipstick - POCT UA - Microscopic Only - BMP8+EGFR - NMR, lipoprofile - POCT glycosylated hemoglobin (Hb A1C) - Hepatic function panel - Vit D  25 hydroxy (rtn osteoporosis monitoring) - POCT UA - Microalbumin  2. Other malaise and fatigue - POCT CBC - POCT urinalysis dipstick - POCT UA - Microscopic Only - BMP8+EGFR - NMR, lipoprofile - POCT glycosylated hemoglobin (Hb A1C) - Hepatic function panel - Vit D  25 hydroxy (rtn osteoporosis monitoring)  3. Unspecified vitamin D deficiency - POCT CBC - POCT urinalysis dipstick - POCT UA - Microscopic Only - BMP8+EGFR - NMR, lipoprofile - POCT glycosylated hemoglobin (Hb A1C) - Hepatic function panel - Vit D  25 hydroxy (rtn osteoporosis monitoring)  4. Other and unspecified hyperlipidemia - POCT CBC - POCT urinalysis dipstick - POCT UA - Microscopic Only - BMP8+EGFR - NMR, lipoprofile - POCT glycosylated hemoglobin (Hb A1C) - Hepatic function panel - Vit D  25 hydroxy (rtn osteoporosis monitoring)  5. ASCVD (arteriosclerotic cardiovascular disease)  6. Diabetes mellitus, insulin dependent (IDDM), uncontrolled - Microalbumin, urine  7. HTN (hypertension)  8. BPH (benign prostatic hyperplasia)  9. Bilateral leg cramps -Serum ferritin  Patient Instructions                       Medicare Annual Wellness Visit  [facility] and the medical providers at [facility] strive to  bring you the best medical care.  In doing so we not only want to address your current medical conditions and concerns but also to detect new conditions early and prevent illness, disease and health-related problems.    Medicare offers a yearly Wellness Visit which allows our clinical staff to assess your need for preventative services including immunizations, lifestyle education, counseling to decrease risk of preventable diseases and screening for fall risk and other medical concerns.    This visit is provided free of charge (no copay) for all Medicare recipients. The clinical pharmacists at [facility] have begun to conduct these Wellness Visits which will also include a thorough review of all your medications.    As you primary medical provider recommend that you make an appointment for your Annual Wellness Visit if you have not done so already this year.  You may set up this appointment before you leave today or you may call back ([phone]) and schedule an appointment.  Please make sure when you  call that you mention that you are scheduling your Annual Wellness Visit with the clinical pharmacist so that the appointment may be made for the proper length of time.     Continue current medications. Continue good therapeutic lifestyle changes which include good diet and exercise. Fall precautions discussed with patient. If an FOBT was given today- please return it to our front desk. If you are over 78 years old - you may need Prevnar 46 or the adult Pneumonia vaccine.  Continue to drink of fluids    Arrie Senate MD

## 2013-04-13 NOTE — Patient Instructions (Addendum)
Medicare Annual Wellness Visit  [facility] and the medical providers [facility] strive to bring you the best medical care.  In doing so we not only want to address your current medical conditions and concerns but also to detect new conditions early and prevent illness, disease and health-related problems.    Medicare offers a yearly Wellness Visit which allows our clinical staff to assess your need for preventative services including immunizations, lifestyle education, counseling to decrease risk of preventable diseases and screening for fall risk and other medical concerns.    This visit is provided free of charge (no copay) for all Medicare recipients. The clinical pharmac[facility] at [facility] have begun to conduct these Wellness Visits which will also include a thorough review of all your medications.    As you primary medical provider recommend that you make an appointment for your Annual Wellness Visit if you have not done so already this year.  You may set up this appointment before you leave today or you may call back [phone] and schedule an appointment.  Please make sure when you call that you mention that you are scheduling your Annual Wellness Visit with the clinical pharmacist so that the appointment may be made for the proper length of time.     Continue current medications. Continue good therapeutic lifestyle changes which include good diet and exercise. Fall precautions discussed with patient. If an FOBT was given today- please return it to our front desk. If you are over 78 years old - you may need Prevnar 13 or the adult Pneumonia vaccine.  Continue to drink of fluids

## 2013-04-14 LAB — MICROALBUMIN, URINE: MICROALBUM., U, RANDOM: 100.3 ug/mL — AB (ref 0.0–17.0)

## 2013-04-14 LAB — BMP8+EGFR
BUN/Creatinine Ratio: 25 — ABNORMAL HIGH (ref 10–22)
BUN: 45 mg/dL — ABNORMAL HIGH (ref 8–27)
CO2: 23 mmol/L (ref 18–29)
CREATININE: 1.77 mg/dL — AB (ref 0.76–1.27)
Calcium: 9.2 mg/dL (ref 8.6–10.2)
Chloride: 102 mmol/L (ref 97–108)
GFR calc Af Amer: 41 mL/min/{1.73_m2} — ABNORMAL LOW (ref >59)
GFR calc non Af Amer: 35 mL/min/{1.73_m2} — ABNORMAL LOW (ref >59)
Glucose: 85 mg/dL (ref 65–99)
Potassium: 4.9 mmol/L (ref 3.5–5.2)
Sodium: 139 mmol/L (ref 134–144)

## 2013-04-14 LAB — HEPATIC FUNCTION PANEL
ALT: 9 IU/L (ref 0–44)
AST: 16 IU/L (ref 0–40)
Albumin: 4.1 g/dL (ref 3.5–4.7)
Alkaline Phosphatase: 57 IU/L (ref 39–117)
BILIRUBIN DIRECT: 0.14 mg/dL (ref 0.00–0.40)
BILIRUBIN TOTAL: 0.4 mg/dL (ref 0.0–1.2)
TOTAL PROTEIN: 6.2 g/dL (ref 6.0–8.5)

## 2013-04-14 LAB — VITAMIN D 25 HYDROXY (VIT D DEFICIENCY, FRACTURES): Vit D, 25-Hydroxy: 48.5 ng/mL (ref 30.0–100.0)

## 2013-04-14 LAB — NMR, LIPOPROFILE
Cholesterol: 116 mg/dL (ref <200)
HDL CHOLESTEROL BY NMR: 36 mg/dL — AB (ref >=40)
HDL Particle Number: 27.9 umol/L — ABNORMAL LOW (ref >=30.5)
LDL Particle Number: 1079 nmol/L — ABNORMAL HIGH (ref <1000)
LDL Size: 20.5 nm — ABNORMAL LOW (ref >20.5)
LDLC SERPL CALC-MCNC: 59 mg/dL (ref <100)
LP-IR SCORE: 60 — AB (ref <=45)
Small LDL Particle Number: 762 nmol/L — ABNORMAL HIGH (ref <=527)
Triglycerides by NMR: 106 mg/dL (ref <150)

## 2013-04-16 ENCOUNTER — Ambulatory Visit: Payer: Medicare Other | Admitting: Family Medicine

## 2013-04-24 ENCOUNTER — Other Ambulatory Visit: Payer: Self-pay | Admitting: Family Medicine

## 2013-04-27 ENCOUNTER — Other Ambulatory Visit: Payer: Self-pay | Admitting: Family Medicine

## 2013-04-28 NOTE — Telephone Encounter (Signed)
Patient last seen in office on 04-13-13. Rx last filled on 03-27-13. Please advise. If approved please route to pool A so nurse can call in to pharmacy

## 2013-04-28 NOTE — Telephone Encounter (Signed)
This is okay x1 

## 2013-04-29 ENCOUNTER — Other Ambulatory Visit: Payer: Self-pay | Admitting: *Deleted

## 2013-04-29 MED ORDER — LORAZEPAM 2 MG PO TABS: ORAL_TABLET | ORAL | Status: DC

## 2013-04-29 NOTE — Telephone Encounter (Signed)
Phoned in.

## 2013-05-20 ENCOUNTER — Telehealth: Payer: Self-pay | Admitting: Pharmacist

## 2013-05-21 NOTE — Telephone Encounter (Signed)
No crestor or lantus samples available

## 2013-05-26 ENCOUNTER — Telehealth: Payer: Self-pay | Admitting: Family Medicine

## 2013-05-26 MED ORDER — INSULIN GLARGINE 100 UNIT/ML SOLOSTAR PEN: 40.0000 [IU] | PEN_INJECTOR | Freq: Every day | SUBCUTANEOUS | Status: DC

## 2013-05-26 NOTE — Telephone Encounter (Signed)
done

## 2013-05-30 ENCOUNTER — Other Ambulatory Visit: Payer: Self-pay | Admitting: Family Medicine

## 2013-06-01 ENCOUNTER — Other Ambulatory Visit: Payer: Self-pay | Admitting: Family Medicine

## 2013-06-03 NOTE — Telephone Encounter (Signed)
rx called into pharmacy

## 2013-06-03 NOTE — Telephone Encounter (Signed)
Patient last seen in office on 04-13-12. Rx last filled on 04-29-13 for #90. Please advise. If approved please route to Pool A so nurse can phone in to pharmacy

## 2013-06-03 NOTE — Telephone Encounter (Signed)
This is okay to refill 

## 2013-06-10 ENCOUNTER — Telehealth: Payer: Self-pay | Admitting: Family Medicine

## 2013-06-10 NOTE — Telephone Encounter (Signed)
No samples at this time.. Patient wanted me to check with tammy to see if there want another insulin sample she can use?

## 2013-06-17 MED ORDER — INSULIN DETEMIR 100 UNIT/ML FLEXPEN: 40.0000 [IU] | PEN_INJECTOR | Freq: Every day | SUBCUTANEOUS | Status: DC

## 2013-06-17 NOTE — Telephone Encounter (Signed)
Switched to [employer] #1  - Lot# [id number] Exp Date 12/2014 Inject same as Lantus - 40 units once a day. #28 samples of Crestor 20mg  left at front desk

## 2013-07-09 ENCOUNTER — Other Ambulatory Visit: Payer: Self-pay | Admitting: Family Medicine

## 2013-07-15 ENCOUNTER — Telehealth: Payer: Self-pay | Admitting: Family Medicine

## 2013-07-15 NOTE — Telephone Encounter (Signed)
No samples available. Patient made aware.  He has a current prescription for Lantus and has been using that since running out of Levemir. He will continue with the Lantus.

## 2013-08-19 ENCOUNTER — Telehealth: Payer: Self-pay | Admitting: *Deleted

## 2013-08-19 ENCOUNTER — Encounter: Payer: Self-pay | Admitting: Cardiology

## 2013-08-19 ENCOUNTER — Encounter: Payer: Self-pay | Admitting: Family Medicine

## 2013-08-19 ENCOUNTER — Ambulatory Visit (INDEPENDENT_AMBULATORY_CARE_PROVIDER_SITE_OTHER): Payer: Medicare Other | Admitting: Cardiology

## 2013-08-19 ENCOUNTER — Other Ambulatory Visit: Payer: Self-pay | Admitting: Family Medicine

## 2013-08-19 ENCOUNTER — Ambulatory Visit (INDEPENDENT_AMBULATORY_CARE_PROVIDER_SITE_OTHER): Payer: Medicare Other | Admitting: Family Medicine

## 2013-08-19 VITALS — BP 109/55 | HR 65 | Temp 97.1°F | Ht 67.5 in | Wt 173.0 lb

## 2013-08-19 VITALS — BP 114/68 | HR 50 | Ht 67.0 in | Wt 170.0 lb

## 2013-08-19 DIAGNOSIS — E1065 Type 1 diabetes mellitus with hyperglycemia: Secondary | ICD-10-CM

## 2013-08-19 DIAGNOSIS — I4891 Unspecified atrial fibrillation: Secondary | ICD-10-CM

## 2013-08-19 DIAGNOSIS — E785 Hyperlipidemia, unspecified: Secondary | ICD-10-CM

## 2013-08-19 DIAGNOSIS — I7090 Unspecified atherosclerosis: Secondary | ICD-10-CM

## 2013-08-19 DIAGNOSIS — E559 Vitamin D deficiency, unspecified: Secondary | ICD-10-CM

## 2013-08-19 DIAGNOSIS — Z09 Encounter for follow-up examination after completed treatment for conditions other than malignant neoplasm: Secondary | ICD-10-CM

## 2013-08-19 DIAGNOSIS — I10 Essential (primary) hypertension: Secondary | ICD-10-CM

## 2013-08-19 DIAGNOSIS — I2510 Atherosclerotic heart disease of native coronary artery without angina pectoris: Secondary | ICD-10-CM

## 2013-08-19 DIAGNOSIS — Z794 Long term (current) use of insulin: Secondary | ICD-10-CM

## 2013-08-19 DIAGNOSIS — E1165 Type 2 diabetes mellitus with hyperglycemia: Secondary | ICD-10-CM

## 2013-08-19 DIAGNOSIS — N400 Enlarged prostate without lower urinary tract symptoms: Secondary | ICD-10-CM

## 2013-08-19 DIAGNOSIS — I499 Cardiac arrhythmia, unspecified: Secondary | ICD-10-CM

## 2013-08-19 DIAGNOSIS — IMO0001 Reserved for inherently not codable concepts without codable children: Secondary | ICD-10-CM

## 2013-08-19 DIAGNOSIS — IMO0002 Reserved for concepts with insufficient information to code with codable children: Secondary | ICD-10-CM

## 2013-08-19 LAB — POCT CBC
GRANULOCYTE PERCENT: 69.3 % (ref 37–80)
HCT, POC: 39.9 % — AB (ref 43.5–53.7)
Hemoglobin: 12.8 g/dL — AB (ref 14.1–18.1)
LYMPH, POC: 2 (ref 0.6–3.4)
MCH, POC: 31 pg (ref 27–31.2)
MCHC: 32.1 g/dL (ref 31.8–35.4)
MCV: 96.6 fL (ref 80–97)
MPV: 8.9 fL (ref 0–99.8)
PLATELET COUNT, POC: 113 10*3/uL — AB (ref 142–424)
POC GRANULOCYTE: 4.7 (ref 2–6.9)
POC LYMPH PERCENT: 28.9 %L (ref 10–50)
RBC: 4.1 M/uL — AB (ref 4.69–6.13)
RDW, POC: 16.6 %
WBC: 6.8 10*3/uL (ref 4.6–10.2)

## 2013-08-19 LAB — POCT GLYCOSYLATED HEMOGLOBIN (HGB A1C): Hemoglobin A1C: 6.5

## 2013-08-19 MED ORDER — WARFARIN SODIUM 5 MG PO TABS: 5.0000 mg | ORAL_TABLET | Freq: Every day | ORAL | Status: DC

## 2013-08-19 MED ORDER — APIXABAN 2.5 MG PO TABS: 2.5000 mg | ORAL_TABLET | Freq: Two times a day (BID) | ORAL | Status: DC

## 2013-08-19 NOTE — Addendum Note (Signed)
Addended by: Lisbeth PlyMOORE, Matricia Begnaud C on: 08/19/2013 11:47 AM   Modules accepted: Orders

## 2013-08-19 NOTE — Patient Instructions (Signed)
Medicare Annual Wellness Visit  [place] and the medical providers at [facility] strive to bring you the best medical care.  In doing so we not only want to address your current medical conditions and concerns but also to detect new conditions early and prevent illness, disease and health-related problems.    Medicare offers a yearly Wellness Visit which allows our clinical staff to assess your need for preventative services including immunizations, lifestyle education, counseling to decrease risk of preventable diseases and screening for fall risk and other medical concerns.    This visit is provided free of charge (no copay) for all Medicare recipients. The clinical pharmacists at [facility] have begun to conduct these Wellness Visits which will also include a thorough review of all your medications.    As you primary medical provider recommend that you make an appointment for your Annual Wellness Visit if you have not done so already this year.  You may set up this appointment before you leave today or you may call back ([phone]) and schedule an appointment.  Please make sure when you call that you mention that you are scheduling your Annual Wellness Visit with the clinical pharmacist so that the appointment may be made for the proper length of time.      Continue current medications. Continue good therapeutic lifestyle changes which include good diet and exercise. Fall precautions discussed with patient. If an FOBT was given today- please return it to our front desk. If you are over 50 years old - you may need Prevnar 13 or the adult Pneumonia vaccine.   

## 2013-08-19 NOTE — Addendum Note (Signed)
Addended by: Roselyn ReefMOORE, Omesha Bowerman C on: 08/19/2013 04:27 PM   Modules accepted: Orders

## 2013-08-19 NOTE — Progress Notes (Signed)
Subjective:    Patient ID: Chad [name], male    DOB: 18-Sep-1931, 80 y.o.   MRN: [id number]  HPI Pt here for follow up and management of chronic medical problems. The patient is doing well today with no specific complaints or problems other than some occasional right hip pain. He does have an FOBT at home but she has not returned. He saw the urologist in June and had a PSA and prostate exam. He does request samples today and if we have any in stock we will certainly give him some.        Patient Active Problem List   Diagnosis Date Noted  . BPH (benign prostatic hyperplasia) 04/13/2013  . ASCVD (arteriosclerotic cardiovascular disease) 11/13/2012  . Obstructive sleep apnea 11/13/2012  . HTN (hypertension) 06/12/2012  .  hyperlipidemia 06/12/2012  . Diabetes mellitus, insulin dependent (IDDM), uncontrolled 06/12/2012   Outpatient Encounter Prescriptions as of 08/19/2013  Medication Sig  . allopurinol (ZYLOPRIM) 100 MG tablet Take 100 mg by mouth daily.  Marland Kitchen aspirin 81 MG tablet Take 81 mg by mouth daily.  Marland Kitchen atenolol (TENORMIN) 50 MG tablet TAKE 1 TABLET BY MOUTH DAILY  . Cholecalciferol (VITAMIN D3) 3000 UNITS TABS Take 2,000 Units by mouth daily.  . fish oil-omega-3 fatty acids 1000 MG capsule Take 1 g by mouth daily.  . furosemide (LASIX) 40 MG tablet TAKE 1 TABLET (40 MG TOTAL) BY MOUTH DAILY.  Marland Kitchen glucose blood (ONE TOUCH ULTRA TEST) test strip USE TO CHECK BLOOD SUGAR 2 TO 3 TIMES DAILY. Dx- 250.0  . Insulin Detemir (LEVEMIR FLEXTOUCH) 100 UNIT/ML Pen Inject 40 Units into the skin daily at 10 pm. Inject 40 units into the skin once a day  . lisinopril (PRINIVIL,ZESTRIL) 40 MG tablet TAKE ONE TABLET BY MOUTH EVERY DAY  . LORazepam (ATIVAN) 2 MG tablet TAKE  (1)  TABLET  THREE TIMES DAILY AS NEEDED.  Marland Kitchen rosuvastatin (CRESTOR) 20 MG tablet Take 1 tablet (20 mg total) by mouth daily.    Review of Systems  Constitutional: Negative.   HENT: Negative.   Eyes: Negative.     Respiratory: Negative.   Cardiovascular: Negative.   Gastrointestinal: Negative.   Endocrine: Negative.   Genitourinary: Negative.   Musculoskeletal: Arthralgias: still has some occasional right hip pain.  Skin: Negative.   Allergic/Immunologic: Negative.   Neurological: Negative.   Hematological: Negative.   Psychiatric/Behavioral: Negative.        Objective:   Physical Exam  Nursing note and vitals reviewed. Constitutional: He is oriented to person, place, and time. He appears well-developed and well-nourished. No distress.  Non-complaining and younger looking than his stated age of 56, alert  HENT:  Head: Normocephalic and atraumatic.  Right Ear: External ear normal.  Left Ear: External ear normal.  Nose: Nose normal.  Mouth/Throat: Oropharynx is clear and moist. No oropharyngeal exudate.  Eyes: Conjunctivae and EOM are normal. Pupils are equal, round, and reactive to light. Right eye exhibits no discharge. Left eye exhibits no discharge. No scleral icterus.  Neck: Normal range of motion. Neck supple. No tracheal deviation present. No thyromegaly present.  Cardiovascular: Normal rate, normal heart sounds and intact distal pulses.  Exam reveals no gallop and no friction rub.   No murmur heard. The heart was irregular at 60 per minute  Pulmonary/Chest: Effort normal and breath sounds normal. No respiratory distress. He has no wheezes. He has no rales. He exhibits no tenderness.  Abdominal: Soft. Bowel sounds are normal. He exhibits  no mass. There is no tenderness. There is no rebound and no guarding.  Musculoskeletal: Normal range of motion. He exhibits no edema and no tenderness.  Lymphadenopathy:    He has no cervical adenopathy.  Neurological: He is alert and oriented to person, place, and time. He has normal reflexes. No cranial nerve deficit.  Skin: Skin is warm and dry. No rash noted. No erythema. No pallor.  Psychiatric: He has a normal mood and affect. His behavior is  normal. Judgment and thought content normal.   BP 109/55  Pulse 65  Temp(Src) 97.1 F (36.2 C) (Oral)  Ht 5' 7.5" (1.715 m)  Wt 173 lb (78.472 kg)  BMI 26.68 kg/m2  EKG: Atrial fibrillation       Assessment & Plan:  1. Other and unspecified hyperlipidemia - NMR, lipoprofile  2. Follow-up examination following completed treatment with high-risk medications, not elsewhere classified - POCT CBC - BMP8+EGFR - Hepatic function panel  3. Unspecified vitamin D deficiency - Vit D  25 hydroxy (rtn osteoporosis monitoring)  4. ASCVD (arteriosclerotic cardiovascular disease)  5. BPH (benign prostatic hyperplasia)  6. Diabetes mellitus, insulin dependent (IDDM), uncontrolled  7. Essential hypertension  8. Irregular heartbeat---atrial fibrillation -EKG order -Will arrange visit with cardiologist for further evaluation  Patient Instructions                       Medicare Annual Wellness Visit  [facility] and the medical providers at [facility] strive to bring you the best medical care.  In doing so we not only want to address your current medical conditions and concerns but also to detect new conditions early and prevent illness, disease and health-related problems.    Medicare offers a yearly Wellness Visit which allows our clinical staff to assess your need for preventative services including immunizations, lifestyle education, counseling to decrease risk of preventable diseases and screening for fall risk and other medical concerns.    This visit is provided free of charge (no copay) for all Medicare recipients. The clinical pharmacists at [facility] have begun to conduct these Wellness Visits which will also include a thorough review of all your medications.    As you primary medical provider recommend that you make an appointment for your Annual Wellness Visit if you have not done so already this year.  You may set up this  appointment before you leave today or you may call back ([phone]) and schedule an appointment.  Please make sure when you call that you mention that you are scheduling your Annual Wellness Visit with the clinical pharmacist so that the appointment may be made for the proper length of time.      Continue current medications. Continue good therapeutic lifestyle changes which include good diet and exercise. Fall precautions discussed with patient. If an FOBT was given today- please return it to our front desk. If you are over 49 years old - you may need Prevnar 55 or the adult Pneumonia vaccine.     Arrie Senate MD

## 2013-08-19 NOTE — Telephone Encounter (Signed)
Pt could not afford Eliquis.  Per Dr Antoine PocheHochrein pt is to start Warfarin 5 mg a day.  He is to have an INR in 5 to 7 days. Attempted to call [facility] to schedule but was sent to the voicemail.  Left message for their CC to call pt with an appointment.  Daughter is aware of orders and will make sure the pt has a follow up appt.

## 2013-08-19 NOTE — Progress Notes (Signed)
HPI  the patient presents as a new patient for me. He has a distant history of bypass and was seeing Dr. Alanda AmassWeintraub.   He says he is not seen him in a while and doesn't report any recent testing. He was seen for routine primary care appointment today and was noted to be in atrial fibrillation. He does not feel this rhythm. He did not any palpitations, presyncope or syncope. He has had no new chest discomfort, neck or arm discomfort. He denies any shortness of breath , PND or orthopnea. He's had no weight gain or edema. He's very active doing his yard work.  No Known Allergies  Current Outpatient Prescriptions  Medication Sig Dispense Refill  . allopurinol (ZYLOPRIM) 100 MG tablet Take 100 mg by mouth daily.      Marland Kitchen. aspirin 81 MG tablet Take 81 mg by mouth daily.      Marland Kitchen. atenolol (TENORMIN) 50 MG tablet TAKE 1 TABLET BY MOUTH DAILY  30 tablet  2  . Cholecalciferol (VITAMIN D3) 3000 UNITS TABS Take 2,000 Units by mouth daily.      . fish oil-omega-3 fatty acids 1000 MG capsule Take 1 g by mouth daily.      . furosemide (LASIX) 40 MG tablet TAKE 1 TABLET (40 MG TOTAL) BY MOUTH DAILY.  30 tablet  3  . Insulin Detemir (LEVEMIR FLEXTOUCH) 100 UNIT/ML Pen Inject 40 Units into the skin daily at 10 pm. Inject 40 units into the skin once a day  3 mL  0  . lisinopril (PRINIVIL,ZESTRIL) 40 MG tablet TAKE ONE TABLET BY MOUTH EVERY DAY  30 tablet  4  . LORazepam (ATIVAN) 2 MG tablet TAKE  (1)  TABLET  THREE TIMES DAILY AS NEEDED.  90 tablet  5  . rosuvastatin (CRESTOR) 20 MG tablet Take 1 tablet (20 mg total) by mouth daily.  42 tablet  0  . glucose blood (ONE TOUCH ULTRA TEST) test strip USE TO CHECK BLOOD SUGAR 2 TO 3 TIMES DAILY. Dx- 250.0  100 each  11   No current facility-administered medications for this visit.    Past Medical History  Diagnosis Date  . Diabetes mellitus without complication   . Stroke   . Hyperlipidemia   . Hypertension   . Gout   . 1St degree AV block   . Neuropathy      Past Surgical History  Procedure Laterality Date  . Carpel tunnel Bilateral   . Kidney stone surgery    . Coronary artery bypass graft      heart- x 6  . Parathyroidectomy      Family History  Problem Relation Age of Onset  . Cancer Father     throat  . CAD Brother 6538    MI questionable, sudden death  . CAD Daughter 4333    MI, stent    History   Social History  . Marital Status: Married    Spouse Name: N/A    Number of Children: 1  . Years of Education: N/A   Occupational History  . Not on file.   Social History Main Topics  . Smoking status: Former Smoker    Types: Cigarettes    Quit date: 06/12/1977  . Smokeless tobacco: Not on file  . Alcohol Use: No  . Drug Use: No  . Sexual Activity: Not on file   Other Topics Concern  . Not on file   Social History Narrative  . No narrative on file  ROS:   Positive for decreased hearing, joint pains.  08/19/2013  PHYSICAL EXAM BP 114/68  Pulse 50  Ht 5\' 7"  (1.702 m)  Wt 170 lb (77.111 kg)  BMI 26.62 kg/m2 GENERAL:  Well appearing HEENT:  Pupils equal round and reactive, fundi not visualized, oral mucosa unremarkable NECK:  No jugular venous distention, waveform within normal limits, carotid upstroke brisk and symmetric, no bruits, no thyromegaly LYMPHATICS:  No cervical, inguinal adenopathy LUNGS:  Clear to auscultation bilaterally BACK:  No CVA tenderness CHEST:  Well healed sternotomy scar. HEART:  PMI not displaced or sustained,S1 and S2 within normal limits, no S3,  no clicks, no rubs, no murmurs, irregular ABD:  Flat, positive bowel sounds normal in frequency in pitch, no bruits, no rebound, no guarding, no midline pulsatile mass, no hepatomegaly, no splenomegaly EXT:  2 plus pulses throughout, no edema, no cyanosis no clubbing SKIN:  No rashes no nodules NEURO:  Cranial nerves II through XII grossly intact, motor grossly intact throughout PSYCH:  Cognitively intact, oriented to person place and  time   EKG:   Atrial fibrillation, rate 47, axis within normal limits, QTC within normal limits, nonspecific T-wave flattening, old anteroseptal infarct.  08/19/2013   ASSESSMENT AND PLAN  ATRIAL FIB:  With his age and mildly elevated creatinine he will need 2.5 mg twice daily of Eliquis.   I had a long discussion with him about this. He understands the risk benefits of bleeding. I think cardioversion is indicated and he seems to have a slow rate already. He will continue otherwise on the meds as listed. I did review his labs to include his most recent CBC and creatinine. He has no active bleeding.   He will stop his aspirin.   We will make sure he has a recent TSH.  CAD:   I will consider followup stress testing in  The future.  HTN:  The blood pressure is at target. No change in medications is indicated. We will continue with therapeutic lifestyle changes (TLC).

## 2013-08-19 NOTE — Patient Instructions (Addendum)
Please start Eliquis 2.5 mg one twice a day. Continue all other medications as listed.  [facility]  [place]ochrein [place]

## 2013-08-20 ENCOUNTER — Encounter: Payer: Self-pay | Admitting: *Deleted

## 2013-08-20 ENCOUNTER — Telehealth: Payer: Self-pay | Admitting: Pharmacist

## 2013-08-20 DIAGNOSIS — Z7901 Long term (current) use of anticoagulants: Secondary | ICD-10-CM | POA: Insufficient documentation

## 2013-08-20 DIAGNOSIS — I4891 Unspecified atrial fibrillation: Secondary | ICD-10-CM

## 2013-08-20 LAB — HEPATIC FUNCTION PANEL
ALK PHOS: 54 IU/L (ref 39–117)
ALT: 12 IU/L (ref 0–44)
AST: 13 IU/L (ref 0–40)
Albumin: 3.9 g/dL (ref 3.5–4.7)
BILIRUBIN DIRECT: 0.16 mg/dL (ref 0.00–0.40)
TOTAL PROTEIN: 6.2 g/dL (ref 6.0–8.5)
Total Bilirubin: 0.4 mg/dL (ref 0.0–1.2)

## 2013-08-20 LAB — BMP8+EGFR
BUN/Creatinine Ratio: 19 (ref 10–22)
BUN: 29 mg/dL — AB (ref 8–27)
CO2: 24 mmol/L (ref 18–29)
Calcium: 8.8 mg/dL (ref 8.6–10.2)
Chloride: 95 mmol/L — ABNORMAL LOW (ref 97–108)
Creatinine, Ser: 1.56 mg/dL — ABNORMAL HIGH (ref 0.76–1.27)
GFR calc non Af Amer: 41 mL/min/{1.73_m2} — ABNORMAL LOW (ref >59)
GFR, EST AFRICAN AMERICAN: 48 mL/min/{1.73_m2} — AB (ref >59)
GLUCOSE: 75 mg/dL (ref 65–99)
POTASSIUM: 4.8 mmol/L (ref 3.5–5.2)
Sodium: 135 mmol/L (ref 134–144)

## 2013-08-20 LAB — NMR, LIPOPROFILE
Cholesterol: 100 mg/dL (ref 100–199)
HDL CHOLESTEROL BY NMR: 47 mg/dL (ref >39)
HDL PARTICLE NUMBER: 25.5 umol/L — AB (ref >=30.5)
LDL Particle Number: 512 nmol/L (ref <1000)
LDL Size: 20.9 nm (ref >20.5)
LDLC SERPL CALC-MCNC: 42 mg/dL (ref 0–99)
Small LDL Particle Number: 160 nmol/L (ref <=527)
Triglycerides by NMR: 54 mg/dL (ref 0–149)

## 2013-08-20 LAB — SPECIMEN STATUS REPORT

## 2013-08-20 LAB — TSH: TSH: 1.83 u[IU]/mL (ref 0.450–4.500)

## 2013-08-20 LAB — VITAMIN D 25 HYDROXY (VIT D DEFICIENCY, FRACTURES): VIT D 25 HYDROXY: 55.2 ng/mL (ref 30.0–100.0)

## 2013-08-20 NOTE — Telephone Encounter (Signed)
appt made for 08/24/13 at 11:30am

## 2013-08-24 ENCOUNTER — Ambulatory Visit (INDEPENDENT_AMBULATORY_CARE_PROVIDER_SITE_OTHER): Payer: Medicare Other | Admitting: Pharmacist

## 2013-08-24 DIAGNOSIS — Z7901 Long term (current) use of anticoagulants: Secondary | ICD-10-CM

## 2013-08-24 DIAGNOSIS — I4891 Unspecified atrial fibrillation: Secondary | ICD-10-CM

## 2013-08-24 LAB — POCT INR: INR: 0.9

## 2013-08-24 NOTE — Patient Instructions (Signed)
Anticoagulation Dose Instructions as of 08/24/2013     Glynis SmilesSun Mon Tue Wed Thu Fri Sat   New Dose 2.5 mg 2.5 mg 2.5 mg 2.5 mg 2.5 mg 2.5 mg 2.5 mg

## 2013-08-24 NOTE — Progress Notes (Signed)
Patient has not started warfarin as recommended by Cardiologist.  He is still taking ASA  daily though.  I discussed risk vs benefits of warfarin therapy and ASA with patient and also clot risk with A fib.  Also gave patient written information about A.fib - what it is, how to reduced risks of blood clots and importance if anticoagulation therapy .  Patient will start warfarin next Sunday (although I advised to start ASAP - he wanted to wait).  He understands risk of waiting.   Henrene Pastor, PharmD, CPP

## 2013-09-03 ENCOUNTER — Telehealth: Payer: Self-pay | Admitting: Family Medicine

## 2013-09-03 NOTE — Telephone Encounter (Signed)
Patient does not want to pay for name brand Crestor. They would like to change to a different medication that has a generic.

## 2013-09-03 NOTE — Telephone Encounter (Signed)
Try Lipitor 10 generic one nightly #30 refill as needed, the patient should have liver function tests in 4 weeks because this is a new statin. He does not have to be seen.

## 2013-09-03 NOTE — Telephone Encounter (Signed)
There is no generic for Crestor. Call prescription in for patient's current dose.

## 2013-09-03 NOTE — Telephone Encounter (Signed)
There is no generic for Crestor. No samples available.  Can we change to another generic and send into [facility]?

## 2013-09-04 MED ORDER — ATORVASTATIN CALCIUM 10 MG PO TABS: 10.0000 mg | ORAL_TABLET | Freq: Every day | ORAL | Status: DC

## 2013-09-07 ENCOUNTER — Other Ambulatory Visit: Payer: Self-pay | Admitting: Family Medicine

## 2013-09-08 NOTE — Telephone Encounter (Signed)
This is okay to refill x6 months 

## 2013-09-08 NOTE — Telephone Encounter (Signed)
Lipitor ordered. Advise patient of need for repeat LFTs in 4 weeks.

## 2013-09-08 NOTE — Telephone Encounter (Signed)
Last seen 08/19/13, last filled 08/05/13. Route to pool. Call into [facility]

## 2013-09-09 NOTE — Telephone Encounter (Signed)
rx called [facility]

## 2013-09-11 ENCOUNTER — Telehealth: Payer: Self-pay | Admitting: Pharmacist

## 2013-09-11 ENCOUNTER — Ambulatory Visit: Payer: Self-pay

## 2013-09-11 NOTE — Telephone Encounter (Signed)
I called to reschedule to recheck protime.  Did not get patient but I did speak with his daughter Efraim KaufmannMelissa.  She states that Mr. Rosalio MacadamiaBullins has stopped warfarin and does not need recheck INR / Protime.  I asked if she and patient understood increase risk of blood clots and stroke by not taking warfarin and she states patient is aware.  I will forward information to his cardiologist and PCP.

## 2013-09-11 NOTE — Telephone Encounter (Signed)
Thank you for letting the cardiologist the aware

## 2013-09-25 ENCOUNTER — Ambulatory Visit (INDEPENDENT_AMBULATORY_CARE_PROVIDER_SITE_OTHER): Payer: Medicare Other | Admitting: Pharmacist

## 2013-09-25 DIAGNOSIS — Z7901 Long term (current) use of anticoagulants: Secondary | ICD-10-CM

## 2013-09-25 DIAGNOSIS — I4891 Unspecified atrial fibrillation: Secondary | ICD-10-CM

## 2013-09-25 NOTE — Progress Notes (Signed)
Patient has discontinued warfarin on own and refuses alternative anticoagulants.  Patient's PCP and Cardiologist notified.

## 2013-10-07 ENCOUNTER — Other Ambulatory Visit: Payer: Self-pay | Admitting: Family Medicine

## 2013-10-23 ENCOUNTER — Encounter: Payer: Self-pay | Admitting: *Deleted

## 2013-10-26 ENCOUNTER — Telehealth: Payer: Self-pay | Admitting: Family Medicine

## 2013-10-26 ENCOUNTER — Other Ambulatory Visit: Payer: Self-pay | Admitting: Family Medicine

## 2013-10-26 NOTE — Telephone Encounter (Signed)
Requesting samples for Levemir and Crestor.  No Crestor available.  No Levemir - but left #1 Toujeo with instructions to inject 40 units daily same as Levemir.

## 2013-11-01 ENCOUNTER — Other Ambulatory Visit: Payer: Self-pay | Admitting: Family Medicine

## 2013-11-10 ENCOUNTER — Ambulatory Visit (INDEPENDENT_AMBULATORY_CARE_PROVIDER_SITE_OTHER): Payer: Medicare Other

## 2013-11-10 DIAGNOSIS — Z23 Encounter for immunization: Secondary | ICD-10-CM

## 2013-11-19 ENCOUNTER — Other Ambulatory Visit: Payer: Self-pay | Admitting: Family Medicine

## 2013-11-23 NOTE — Telephone Encounter (Signed)
This is okay to refill 

## 2013-11-23 NOTE — Telephone Encounter (Signed)
Last filled 10/16/13, last seen 08/19/13. Route to pool A, [facility]

## 2013-11-24 NOTE — Telephone Encounter (Signed)
Called in.

## 2013-12-02 ENCOUNTER — Ambulatory Visit: Payer: Medicare Other | Admitting: Cardiology

## 2013-12-17 ENCOUNTER — Other Ambulatory Visit: Payer: Self-pay | Admitting: Family Medicine

## 2013-12-21 ENCOUNTER — Ambulatory Visit: Payer: Medicare Other | Admitting: Family Medicine

## 2013-12-24 ENCOUNTER — Ambulatory Visit (INDEPENDENT_AMBULATORY_CARE_PROVIDER_SITE_OTHER): Payer: Medicare Other | Admitting: Family Medicine

## 2013-12-24 ENCOUNTER — Other Ambulatory Visit: Payer: Self-pay | Admitting: Family Medicine

## 2013-12-24 ENCOUNTER — Encounter: Payer: Self-pay | Admitting: Family Medicine

## 2013-12-24 VITALS — BP 124/55 | HR 50 | Temp 97.3°F | Ht 67.0 in | Wt 171.0 lb

## 2013-12-24 DIAGNOSIS — IMO0001 Reserved for inherently not codable concepts without codable children: Secondary | ICD-10-CM

## 2013-12-24 DIAGNOSIS — Z794 Long term (current) use of insulin: Secondary | ICD-10-CM

## 2013-12-24 DIAGNOSIS — I2510 Atherosclerotic heart disease of native coronary artery without angina pectoris: Secondary | ICD-10-CM

## 2013-12-24 DIAGNOSIS — E1165 Type 2 diabetes mellitus with hyperglycemia: Secondary | ICD-10-CM

## 2013-12-24 DIAGNOSIS — N400 Enlarged prostate without lower urinary tract symptoms: Secondary | ICD-10-CM

## 2013-12-24 DIAGNOSIS — E559 Vitamin D deficiency, unspecified: Secondary | ICD-10-CM

## 2013-12-24 DIAGNOSIS — I10 Essential (primary) hypertension: Secondary | ICD-10-CM

## 2013-12-24 DIAGNOSIS — E1065 Type 1 diabetes mellitus with hyperglycemia: Secondary | ICD-10-CM

## 2013-12-24 DIAGNOSIS — I4891 Unspecified atrial fibrillation: Secondary | ICD-10-CM

## 2013-12-24 LAB — POCT GLYCOSYLATED HEMOGLOBIN (HGB A1C): Hemoglobin A1C: 6.9

## 2013-12-24 NOTE — Progress Notes (Signed)
Subjective:    Patient ID: Chad [name], male    DOB: 1931/12/27, 80 y.o.   MRN: [id number]  HPI Pt here for follow up and management of chronic medical problems. Patient has no specific complaints. He does not get his eyes checked regularly and says his vision is good. 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 He denies chest pain or shortness of breath or problems with his stomach.        Patient Active Problem List   Diagnosis Date Noted  . Long term (current) use of anticoagulants 08/20/2013  . Atrial fibrillation, unspecified 08/19/2013  . BPH (benign prostatic hyperplasia) 04/13/2013  . ASCVD (arteriosclerotic cardiovascular disease) 11/13/2012  . Obstructive sleep apnea 11/13/2012  . HTN (hypertension) 06/12/2012  .  hyperlipidemia 06/12/2012  . Diabetes mellitus, insulin dependent (IDDM), uncontrolled 06/12/2012   Outpatient Encounter Prescriptions as of 12/24/2013  Medication Sig  . allopurinol (ZYLOPRIM) 100 MG tablet Take 100 mg by mouth daily.  Marland Kitchen atenolol (TENORMIN) 50 MG tablet TAKE 1 TABLET BY MOUTH DAILY  . atorvastatin (LIPITOR) 10 MG tablet TAKE 1 TABLET (10 MG TOTAL) BY MOUTH DAILY.  Marland Kitchen Cholecalciferol (VITAMIN D3) 3000 UNITS TABS Take 2,000 Units by mouth daily.  . fish oil-omega-3 fatty acids 1000 MG capsule Take 1 g by mouth daily.  . furosemide (LASIX) 40 MG tablet TAKE 1 TABLET (40 MG TOTAL) BY MOUTH DAILY.  Marland Kitchen glucose blood (ONE TOUCH ULTRA TEST) test strip USE TO CHECK BLOOD SUGAR 2 TO 3 TIMES DAILY. Dx- 250.0  . Insulin Detemir (LEVEMIR FLEXTOUCH) 100 UNIT/ML Pen Inject 40 Units into the skin daily at 10 pm. Inject 40 units into the skin once a day  . LANTUS SOLOSTAR 100 UNIT/ML Solostar Pen INJECT 40 UNITS INTO THE SKIN AT BEDTIME.  Marland Kitchen lisinopril (PRINIVIL,ZESTRIL) 40 MG tablet TAKE ONE TABLET BY MOUTH EVERY DAY  . LORazepam (ATIVAN) 2 MG tablet TAKE  (1)  TABLET  THREE  TIMES DAILY AS NEEDED.    Review of Systems  Constitutional: Negative.   HENT: Negative.   Eyes: Negative.   Respiratory: Negative.   Cardiovascular: Negative.   Gastrointestinal: Negative.   Endocrine: Negative.   Genitourinary: Negative.   Musculoskeletal: Negative.   Skin: Negative.   Allergic/Immunologic: Negative.   Neurological: Negative.   Hematological: Negative.   Psychiatric/Behavioral: Negative.        Objective:   Physical Exam  Constitutional: He is oriented to person, place, and time. He appears well-developed and well-nourished. No distress.  HENT:  Head: Normocephalic and atraumatic.  Right Ear: External ear normal.  Left Ear: External ear normal.  Nose: Nose normal.  Mouth/Throat: Oropharynx is clear and moist. No oropharyngeal exudate.  Eyes: Conjunctivae and EOM are normal. Pupils are equal, round, and reactive to light. Right eye exhibits no discharge. Left eye exhibits no discharge. No scleral icterus.  Neck: Normal range of motion. Neck supple. No thyromegaly present.  No carotid bruits or anterior cervical adenopathy  Cardiovascular: Normal rate, normal heart sounds and intact distal pulses.   No murmur heard. The heart is irregular irregular at 72/m  Pulmonary/Chest: Effort normal and breath sounds normal. No respiratory distress. He has no wheezes. He has no rales. He exhibits no tenderness.  No axillary adenopathy  Abdominal: Soft. Bowel sounds are normal. He exhibits no mass. There is no tenderness. There is no rebound  and no guarding.  No abdominal bruits or inguinal adenopathy  Musculoskeletal: Normal range of motion. He exhibits no edema or tenderness.  Lymphadenopathy:    He has no cervical adenopathy.  Neurological: He is alert and oriented to person, place, and time. He has normal reflexes. No cranial nerve deficit.  Skin: Skin is warm and dry. No rash noted. No erythema. No pallor.  Bilateral great toenail fungus  Psychiatric: He has a  normal mood and affect. His behavior is normal. Judgment and thought content normal.  Nursing note and vitals reviewed.  BP 124/55 mmHg  Pulse 50  Temp(Src) 97.3 F (36.3 C) (Oral)  Ht 5' 7"  (1.702 m)  Wt 171 lb (77.565 kg)  BMI 26.78 kg/m2        Assessment & Plan:  1. Atrial fibrillation, unspecified - POCT CBC - BMP8+EGFR - Hepatic function panel - NMR, lipoprofile - Vit D  25 hydroxy (rtn osteoporosis monitoring)  2. ASCVD (arteriosclerotic cardiovascular disease) - POCT CBC  3. BPH (benign prostatic hyperplasia) - POCT CBC  4. Diabetes mellitus, insulin dependent (IDDM), uncontrolled - POCT CBC - POCT glycosylated hemoglobin (Hb A1C)  5. Essential hypertension - POCT CBC - BMP8+EGFR - Hepatic function panel - NMR, lipoprofile  6. Vitamin D deficiency - Vit D  25 hydroxy (rtn osteoporosis monitoring)  Patient Instructions                       Medicare Annual Wellness Visit  [facility] and the medical providers at [facility] strive to bring you the best medical care.  In doing so we not only want to address your current medical conditions and concerns but also to detect new conditions early and prevent illness, disease and health-related problems.    Medicare offers a yearly Wellness Visit which allows our clinical staff to assess your need for preventative services including immunizations, lifestyle education, counseling to decrease risk of preventable diseases and screening for fall risk and other medical concerns.    This visit is provided free of charge (no copay) for all Medicare recipients. The clinical pharmacists at [facility] have begun to conduct these Wellness Visits which will also include a thorough review of all your medications.    As you primary medical provider recommend that you make an appointment for your Annual Wellness Visit if you have not done so already this year.  You may set up this  appointment before you leave today or you may call back [phone]) and schedule an appointment.  Please make sure when you call that you mention that you are scheduling your Annual Wellness Visit with the clinical pharmacist so that the appointment may be made for the proper length of time.     Continue current medications. Continue good therapeutic lifestyle changes which include good diet and exercise. Fall precautions discussed with patient. If an FOBT was given today- please return it to our front desk. If you are over 27 years old - you may need Prevnar 66 or the adult Pneumonia vaccine.  Flu Shots will be available at our office starting mid- September. Please call and schedule a FLU CLINIC APPOINTMENT.   Continue to monitor blood sugars regularly Monitor feet closely for any infection Drink plenty of fluids    Arrie Senate MD

## 2013-12-24 NOTE — Patient Instructions (Addendum)
Medicare Annual Wellness Visit   and the medical providers at [facility] strive to bring you the best medical care.  In doing so we not only want to address your current medical conditions and concerns but also to detect new conditions early and prevent illness, disease and health-related problems.    Medicare offers a yearly Wellness Visit which allows our clinical staff to assess your need for preventative services including immunizations, lifestyle education, counseling to decrease risk of preventable diseases and screening for fall risk and other medical concerns.    This visit is provided free of charge (no copay) for all Medicare recipients. The clinical pharmacists at [facility] have begun to conduct these Wellness Visits which will also include a thorough review of all your medications.    As you primary medical provider recommend that you make an appointment for your Annual Wellness Visit if you have not done so already this year.  You may set up this appointment before you leave today or you may call back [phone] and schedule an appointment.  Please make sure when you call that you mention that you are scheduling your Annual Wellness Visit with the clinical pharmacist so that the appointment may be made for the proper length of time.     Continue current medications. Continue good therapeutic lifestyle changes which include good diet and exercise. Fall precautions discussed with patient. If an FOBT was given today- please return it to our front desk. If you are over 78 years old - you may need Prevnar 13 or the adult Pneumonia vaccine.  Flu Shots will be available at our office starting mid- September. Please call and schedule a FLU CLINIC APPOINTMENT.   Continue to monitor blood sugars regularly Monitor feet closely for any infection Drink plenty of fluids

## 2013-12-24 NOTE — Addendum Note (Signed)
Addended by: Tommas OlpHANDY, ASHLEY N on: 12/24/2013 04:42 PM   Modules accepted: Orders

## 2013-12-25 LAB — BMP8+EGFR
BUN/Creatinine Ratio: 20 (ref 10–22)
BUN: 29 mg/dL — ABNORMAL HIGH (ref 8–27)
CHLORIDE: 99 mmol/L (ref 97–108)
CO2: 27 mmol/L (ref 18–29)
Calcium: 9 mg/dL (ref 8.6–10.2)
Creatinine, Ser: 1.46 mg/dL — ABNORMAL HIGH (ref 0.76–1.27)
GFR calc non Af Amer: 45 mL/min/{1.73_m2} — ABNORMAL LOW (ref >59)
GFR, EST AFRICAN AMERICAN: 52 mL/min/{1.73_m2} — AB (ref >59)
GLUCOSE: 80 mg/dL (ref 65–99)
Potassium: 4.7 mmol/L (ref 3.5–5.2)
Sodium: 141 mmol/L (ref 134–144)

## 2013-12-25 LAB — CBC WITH DIFFERENTIAL
BASOS: 0 %
Basophils Absolute: 0 10*3/uL (ref 0.0–0.2)
EOS: 1 %
Eosinophils Absolute: 0.1 10*3/uL (ref 0.0–0.4)
HCT: 42 % (ref 37.5–51.0)
HEMOGLOBIN: 13.8 g/dL (ref 12.6–17.7)
IMMATURE GRANS (ABS): 0 10*3/uL (ref 0.0–0.1)
Immature Granulocytes: 0 %
Lymphocytes Absolute: 1.8 10*3/uL (ref 0.7–3.1)
Lymphs: 17 %
MCH: 31.2 pg (ref 26.6–33.0)
MCHC: 32.9 g/dL (ref 31.5–35.7)
MCV: 95 fL (ref 79–97)
MONOS ABS: 0.7 10*3/uL (ref 0.1–0.9)
Monocytes: 6 %
Neutrophils Absolute: 8 10*3/uL — ABNORMAL HIGH (ref 1.4–7.0)
Neutrophils Relative %: 76 %
Platelets: 173 10*3/uL (ref 150–379)
RBC: 4.43 x10E6/uL (ref 4.14–5.80)
RDW: 15.5 % — ABNORMAL HIGH (ref 12.3–15.4)
WBC: 10.6 10*3/uL (ref 3.4–10.8)

## 2013-12-25 LAB — NMR, LIPOPROFILE
Cholesterol: 138 mg/dL (ref 100–199)
HDL CHOLESTEROL BY NMR: 42 mg/dL (ref >39)
HDL Particle Number: 22.1 umol/L — ABNORMAL LOW (ref >=30.5)
LDL Particle Number: 1249 nmol/L — ABNORMAL HIGH (ref <1000)
LDL Size: 20.7 nm (ref >20.5)
LDL-C: 81 mg/dL (ref 0–99)
LP-IR Score: 30 (ref <=45)
Small LDL Particle Number: 603 nmol/L — ABNORMAL HIGH (ref <=527)
TRIGLYCERIDES BY NMR: 77 mg/dL (ref 0–149)

## 2013-12-25 LAB — HEPATIC FUNCTION PANEL
ALT: 9 IU/L (ref 0–44)
AST: 15 IU/L (ref 0–40)
Albumin: 3.8 g/dL (ref 3.5–4.7)
Alkaline Phosphatase: 75 IU/L (ref 39–117)
BILIRUBIN DIRECT: 0.14 mg/dL (ref 0.00–0.40)
BILIRUBIN TOTAL: 0.4 mg/dL (ref 0.0–1.2)
TOTAL PROTEIN: 6.3 g/dL (ref 6.0–8.5)

## 2013-12-25 LAB — VITAMIN D 25 HYDROXY (VIT D DEFICIENCY, FRACTURES): Vit D, 25-Hydroxy: 48.6 ng/mL (ref 30.0–100.0)

## 2013-12-28 ENCOUNTER — Telehealth: Payer: Self-pay | Admitting: Family Medicine

## 2013-12-28 NOTE — Telephone Encounter (Signed)
-----   Message from Ernestina Pennaonald W Moore, MD sent at 12/25/2013  2:05 PM EST ----- The blood sugar is good at 80. The creatinine, the most important kidney function test is slightly improved and lower than it was 4 months ago. It still remains elevated at 1.46. The electrolytes including potassium are good. All liver function tests are within normal limits The vitamin D level is good at 48.6, continue current treatment The CBC has a normal white blood cell count. The hemoglobin is improved and stable at 13.8. The platelet count is adequate.

## 2014-02-17 ENCOUNTER — Other Ambulatory Visit: Payer: Self-pay | Admitting: Family Medicine

## 2014-02-18 ENCOUNTER — Other Ambulatory Visit: Payer: Self-pay | Admitting: Family Medicine

## 2014-02-18 NOTE — Telephone Encounter (Signed)
Last filled 01/13/14, last seen 12/24/13. Call into [facility] if approved

## 2014-02-27 ENCOUNTER — Other Ambulatory Visit: Payer: Self-pay | Admitting: Family Medicine

## 2014-03-04 ENCOUNTER — Other Ambulatory Visit: Payer: Self-pay | Admitting: Family Medicine

## 2014-03-13 ENCOUNTER — Other Ambulatory Visit: Payer: Self-pay | Admitting: Family Medicine

## 2014-03-15 ENCOUNTER — Other Ambulatory Visit: Payer: Self-pay | Admitting: Family Medicine

## 2014-04-19 ENCOUNTER — Other Ambulatory Visit: Payer: Self-pay | Admitting: Family Medicine

## 2014-05-04 ENCOUNTER — Other Ambulatory Visit: Payer: Medicare Other

## 2014-05-04 ENCOUNTER — Ambulatory Visit (INDEPENDENT_AMBULATORY_CARE_PROVIDER_SITE_OTHER): Payer: Medicare Other | Admitting: Family Medicine

## 2014-05-04 ENCOUNTER — Encounter: Payer: Self-pay | Admitting: Family Medicine

## 2014-05-04 VITALS — BP 146/54 | HR 48 | Temp 98.3°F | Ht 67.0 in | Wt 173.0 lb

## 2014-05-04 DIAGNOSIS — D696 Thrombocytopenia, unspecified: Secondary | ICD-10-CM | POA: Insufficient documentation

## 2014-05-04 DIAGNOSIS — E1165 Type 2 diabetes mellitus with hyperglycemia: Principal | ICD-10-CM

## 2014-05-04 DIAGNOSIS — E785 Hyperlipidemia, unspecified: Secondary | ICD-10-CM

## 2014-05-04 DIAGNOSIS — Z794 Long term (current) use of insulin: Principal | ICD-10-CM

## 2014-05-04 DIAGNOSIS — I4891 Unspecified atrial fibrillation: Secondary | ICD-10-CM | POA: Diagnosis not present

## 2014-05-04 DIAGNOSIS — E1065 Type 1 diabetes mellitus with hyperglycemia: Secondary | ICD-10-CM | POA: Diagnosis not present

## 2014-05-04 DIAGNOSIS — I2510 Atherosclerotic heart disease of native coronary artery without angina pectoris: Secondary | ICD-10-CM | POA: Diagnosis not present

## 2014-05-04 DIAGNOSIS — E559 Vitamin D deficiency, unspecified: Secondary | ICD-10-CM

## 2014-05-04 DIAGNOSIS — I10 Essential (primary) hypertension: Secondary | ICD-10-CM | POA: Diagnosis not present

## 2014-05-04 DIAGNOSIS — IMO0001 Reserved for inherently not codable concepts without codable children: Secondary | ICD-10-CM

## 2014-05-04 DIAGNOSIS — N400 Enlarged prostate without lower urinary tract symptoms: Secondary | ICD-10-CM | POA: Diagnosis not present

## 2014-05-04 LAB — POCT CBC
Granulocyte percent: 76.3 % (ref 37–80)
HCT, POC: 45.1 % (ref 43.5–53.7)
Hemoglobin: 14 g/dL — AB (ref 14.1–18.1)
Lymph, poc: 1.6 (ref 0.6–3.4)
MCH, POC: 29.9 pg (ref 27–31.2)
MCHC: 31.2 g/dL — AB (ref 31.8–35.4)
MCV: 96 fL (ref 80–97)
MPV: 9.5 fL (ref 0–99.8)
POC Granulocyte: 6 (ref 2–6.9)
POC LYMPH PERCENT: 31 % (ref 10–50)
Platelet Count, POC: 141 K/uL — AB (ref 142–424)
RBC: 4.69 M/uL (ref 4.69–6.13)
RDW, POC: 16.7 %
WBC: 7.8 K/uL (ref 4.6–10.2)

## 2014-05-04 LAB — POCT GLYCOSYLATED HEMOGLOBIN (HGB A1C): Hemoglobin A1C: 6.8

## 2014-05-04 NOTE — Addendum Note (Signed)
Addended by: Tommas OlpHANDY, Jennet Scroggin N on: 05/04/2014 02:08 PM   Modules accepted: Orders

## 2014-05-04 NOTE — Progress Notes (Signed)
Subjective:    Patient ID: Chad [name], male    DOB: 1931-06-10, 81 y.o.   MRN: [id number]  HPI Pt here for follow up and management of chronic medical problems which includes atrial fibrillation, hypertension, hyperlipidemia, and diabetes. He is taking medications regularly. The patient comes in today as usual with no complaints. He is due to get lab work and a rectal exam and will be given an FOBT to return. We will also get a urinalysis. The patient is also followed by the cardiologist and says that that exam should be coming up soon. He has not had an eye exam in over a year and says his vision is good. He indicates that his blood sugars at home have been running in the 70s and 80s and he does not check his blood pressure at home he denies chest pain shortness of breath GI symptoms or voiding issues.        Patient Active Problem List   Diagnosis Date Noted  . Long term (current) use of anticoagulants 08/20/2013  . Atrial fibrillation, unspecified 08/19/2013  . BPH (benign prostatic hyperplasia) 04/13/2013  . ASCVD (arteriosclerotic cardiovascular disease) 11/13/2012  . Obstructive sleep apnea 11/13/2012  . HTN (hypertension) 06/12/2012  .  hyperlipidemia 06/12/2012  . Diabetes mellitus, insulin dependent (IDDM), uncontrolled 06/12/2012   Outpatient Encounter Prescriptions as of 05/04/2014  Medication Sig  . allopurinol (ZYLOPRIM) 100 MG tablet Take 100 mg by mouth daily.  Marland Kitchen atenolol (TENORMIN) 50 MG tablet TAKE 1 TABLET BY MOUTH DAILY  . atorvastatin (LIPITOR) 10 MG tablet TAKE 1 TABLET (10 MG TOTAL) BY MOUTH DAILY.  Marland Kitchen Cholecalciferol (VITAMIN D3) 3000 UNITS TABS Take 2,000 Units by mouth daily.  . fish oil-omega-3 fatty acids 1000 MG capsule Take 1 g by mouth daily.  . furosemide (LASIX) 40 MG tablet TAKE 1 TABLET (40 MG TOTAL) BY MOUTH DAILY.  Marland Kitchen glucose blood (ONE TOUCH ULTRA TEST) test strip USE TO CHECK BLOOD SUGAR 2 TO 3 TIMES DAILY. Dx- 250.0  . Insulin Detemir  (LEVEMIR FLEXTOUCH) 100 UNIT/ML Pen Inject 40 Units into the skin daily at 10 pm. Inject 40 units into the skin once a day  . LANTUS SOLOSTAR 100 UNIT/ML Solostar Pen INJECT 40 UNITS INTO THE SKIN AT BEDTIME.  Marland Kitchen lisinopril (PRINIVIL,ZESTRIL) 40 MG tablet TAKE ONE TABLET BY MOUTH EVERY DAY  . LORazepam (ATIVAN) 2 MG tablet TAKE  (1)  TABLET  THREE TIMES DAILY AS NEEDED.  . [DISCONTINUED] atorvastatin (LIPITOR) 10 MG tablet TAKE 1 TABLET (10 MG TOTAL) BY MOUTH DAILY.  . [DISCONTINUED] lisinopril (PRINIVIL,ZESTRIL) 40 MG tablet TAKE ONE TABLET BY MOUTH EVERY DAY    Review of Systems  Constitutional: Negative.   HENT: Negative.   Eyes: Negative.   Respiratory: Negative.   Cardiovascular: Negative.   Gastrointestinal: Negative.   Endocrine: Negative.   Genitourinary: Negative.   Musculoskeletal: Negative.   Skin: Negative.   Allergic/Immunologic: Negative.   Neurological: Negative.   Hematological: Negative.   Psychiatric/Behavioral: Negative.        Objective:   Physical Exam  Constitutional: He is oriented to person, place, and time. He appears well-developed and well-nourished. No distress.  The patient is smiling, pleasant and alert today.  HENT:  Head: Normocephalic and atraumatic.  Right Ear: External ear normal.  Left Ear: External ear normal.  Nose: Nose normal.  Mouth/Throat: Oropharynx is clear and moist. No oropharyngeal exudate.  He wears upper and lower dentures.  Eyes: Conjunctivae and EOM are  normal. Pupils are equal, round, and reactive to light. Right eye exhibits no discharge. Left eye exhibits no discharge. No scleral icterus.  Neck: Normal range of motion. Neck supple. No thyromegaly present.  No bruits, anterior cervical adenopathy or thyromegaly  Cardiovascular: Normal rate, normal heart sounds and intact distal pulses.  Exam reveals no friction rub.   No murmur heard. The heart is slightly irregular at 60/m  Pulmonary/Chest: Effort normal and breath sounds  normal. No respiratory distress. He has no wheezes. He has no rales. He exhibits no tenderness.  Clear anteriorly and posteriorly  Abdominal: Soft. Bowel sounds are normal. He exhibits no mass. There is no tenderness. There is no rebound and no guarding.  The abdomen is soft without bruits masses or tenderness  Genitourinary: Rectum normal and penis normal.  The prostate is enlarged but soft and smooth. There are no rectal masses. There are no inguinal nodes. External genitalia were within normal limits and there were no hernias palpated bilaterally.  Musculoskeletal: Normal range of motion. He exhibits no edema or tenderness.  Lymphadenopathy:    He has no cervical adenopathy.  Neurological: He is alert and oriented to person, place, and time. He has normal reflexes. No cranial nerve deficit.  Skin: Skin is warm and dry. No rash noted. No erythema. No pallor.  Psychiatric: He has a normal mood and affect. His behavior is normal. Judgment and thought content normal.  Nursing note and vitals reviewed.  BP 146/54 mmHg  Pulse 48  Temp(Src) 98.3 F (36.8 C) (Oral)  Ht 5' 7"  (1.702 m)  Wt 173 lb (78.472 kg)  BMI 27.09 kg/m2        Assessment & Plan:  1. Atrial fibrillation, unspecified -The patient is doing well with his atrial fibrillation and he is controlled well with his rate. He should follow-up with the cardiologist soon. - POCT CBC - BMP8+EGFR - Hepatic function panel  2. ASCVD (arteriosclerotic cardiovascular disease) -We will be checking his cholesterol today and he will follow-up with cardiology soon. - POCT CBC - BMP8+EGFR - Hepatic function panel - NMR, lipoprofile  3. BPH (benign prostatic hyperplasia) -The prostate remains enlarged but soft and smooth and the patient is having no issues with voiding - POCT CBC - POCT urinalysis dipstick - POCT UA - Microscopic Only  4. Diabetes mellitus, insulin dependent (IDDM), uncontrolled -The patient says his blood sugars  at home have been running in the 70-80 range and that he is staying active physically and taking his medicine regularly - POCT CBC - BMP8+EGFR - POCT glycosylated hemoglobin (Hb A1C) - POCT UA - Microalbumin  5. Essential hypertension -The initial blood pressure today was slightly elevated and the patient will continue to take his current medicine as well as watch his sodium intake more closely - POCT CBC - BMP8+EGFR - Hepatic function panel - POCT UA - Microalbumin  6. Vitamin D deficiency -He should continue with his vitamin D dosing and this will be changed if the vitamin D level comes back too low. - POCT CBC - Vit D  25 hydroxy (rtn osteoporosis monitoring)  7. Hyperlipidemia -Continue with current treatment and aggressive therapeutic lifestyle changes - POCT CBC - NMR, lipoprofile  Patient Instructions                       Medicare Annual Wellness Visit  [facility] and the medical providers at [facility] strive to bring you the best medical care.  In doing so we not only want to address your current medical conditions and concerns but also to detect new conditions early and prevent illness, disease and health-related problems.    Medicare offers a yearly Wellness Visit which allows our clinical staff to assess your need for preventative services including immunizations, lifestyle education, counseling to decrease risk of preventable diseases and screening for fall risk and other medical concerns.    This visit is provided free of charge (no copay) for all Medicare recipients. The clinical pharmacists at [facility] have begun to conduct these Wellness Visits which will also include a thorough review of all your medications.    As you primary medical provider recommend that you make an appointment for your Annual Wellness Visit if you have not done so already this year.  You may set up this appointment before you leave today or you  may call back [phone]) and schedule an appointment.  Please make sure when you call that you mention that you are scheduling your Annual Wellness Visit with the clinical pharmacist so that the appointment may be made for the proper length of time.     Continue current medications. Continue good therapeutic lifestyle changes which include good diet and exercise. Fall precautions discussed with patient. If an FOBT was given today- please return it to our front desk. If you are over 15 years old - you may need Prevnar 20 or the adult Pneumonia vaccine.  Flu Shots are still available at our office. If you still haven't had one please call to set up a nurse visit to get one.   After your visit with [place] today you will receive a survey in the mail or online from [employer] regarding your care with [place]. Please take a moment to fill this out. Your feedback is very important to [place] as you can help [place] better understand your patient needs as well as improve your experience and satisfaction. WE CARE ABOUT YOU!!!   The patient should make sure that he keeps his follow-up appointment with the cardiologist He should also schedule an eye exam since he has diabetes or should be done at least once a year He should continue to drink plenty of fluids and continue to monitor his blood sugars at home   Arrie Senate MD

## 2014-05-04 NOTE — Patient Instructions (Addendum)
Medicare Annual Wellness Visit  [facility] and the medical providers at [facility] strive to bring you the best medical care.  In doing so we not only want to address your current medical conditions and concerns but also to detect new conditions early and prevent illness, disease and health-related problems.    Medicare offers a yearly Wellness Visit which allows our clinical staff to assess your need for preventative services including immunizations, lifestyle education, counseling to decrease risk of preventable diseases and screening for fall risk and other medical concerns.    This visit is provided free of charge (no copay) for all Medicare recipients. The clinical pharmacists at [facility] have begun to conduct these Wellness Visits which will also include a thorough review of all your medications.    As you primary medical provider recommend that you make an appointment for your Annual Wellness Visit if you have not done so already this year.  You may set up this appointment before you leave today or you may call back [phone] and schedule an appointment.  Please make sure when you call that you mention that you are scheduling your Annual Wellness Visit with the clinical pharmacist so that the appointment may be made for the proper length of time.     Continue current medications. Continue good therapeutic lifestyle changes which include good diet and exercise. Fall precautions discussed with patient. If an FOBT was given today- please return it to our front desk. If you are over 79 years old - you may need Prevnar 13 or the adult Pneumonia vaccine.  Flu Shots are still available at our office. If you still haven't had one please call to set up a nurse visit to get one.   After your visit with us today you will receive a survey in the mail or online from [employer] regarding your care with us. Please take a moment to  fill this out. Your feedback is very important to us as you can help us better understand your patient needs as well as improve your experience and satisfaction. WE CARE ABOUT YOU!!!   The patient should make sure that he keeps his follow-up appointment with the cardiologist He should also schedule an eye exam since he has diabetes or should be done at least once a year He should continue to drink plenty of fluids and continue to monitor his blood sugars at home

## 2014-05-04 NOTE — Progress Notes (Signed)
Lab only 

## 2014-05-05 LAB — NMR, LIPOPROFILE
CHOLESTEROL: 163 mg/dL (ref 100–199)
HDL Cholesterol by NMR: 53 mg/dL (ref >39)
HDL PARTICLE NUMBER: 25.3 umol/L — AB (ref >=30.5)
LDL Particle Number: 1154 nmol/L — ABNORMAL HIGH (ref <1000)
LDL Size: 21.5 nm (ref >20.5)
LDL-C: 97 mg/dL (ref 0–99)
LP-IR Score: <25 (ref <=45)
Small LDL Particle Number: 311 nmol/L (ref <=527)
Triglycerides by NMR: 64 mg/dL (ref 0–149)

## 2014-05-05 LAB — HEPATIC FUNCTION PANEL
ALK PHOS: 69 IU/L (ref 39–117)
ALT: 13 IU/L (ref 0–44)
AST: 15 IU/L (ref 0–40)
Albumin: 3.8 g/dL (ref 3.5–4.7)
BILIRUBIN TOTAL: 0.5 mg/dL (ref 0.0–1.2)
Bilirubin, Direct: 0.15 mg/dL (ref 0.00–0.40)
Total Protein: 6.4 g/dL (ref 6.0–8.5)

## 2014-05-05 LAB — BMP8+EGFR
BUN / CREAT RATIO: 22 (ref 10–22)
BUN: 35 mg/dL — ABNORMAL HIGH (ref 8–27)
CO2: 23 mmol/L (ref 18–29)
Calcium: 8.9 mg/dL (ref 8.6–10.2)
Chloride: 101 mmol/L (ref 97–108)
Creatinine, Ser: 1.57 mg/dL — ABNORMAL HIGH (ref 0.76–1.27)
GFR calc Af Amer: 47 mL/min/{1.73_m2} — ABNORMAL LOW (ref >59)
GFR calc non Af Amer: 41 mL/min/{1.73_m2} — ABNORMAL LOW (ref >59)
Glucose: 69 mg/dL (ref 65–99)
Potassium: 4.1 mmol/L (ref 3.5–5.2)
Sodium: 140 mmol/L (ref 134–144)

## 2014-05-05 LAB — VITAMIN D 25 HYDROXY (VIT D DEFICIENCY, FRACTURES): VIT D 25 HYDROXY: 44.7 ng/mL (ref 30.0–100.0)

## 2014-05-20 ENCOUNTER — Other Ambulatory Visit: Payer: Self-pay | Admitting: Family Medicine

## 2014-05-22 NOTE — Telephone Encounter (Signed)
Last seen 05/04/14, last filled 04/05/14. [facility]

## 2014-05-24 NOTE — Telephone Encounter (Signed)
phoned in med

## 2014-06-07 ENCOUNTER — Other Ambulatory Visit: Payer: Self-pay

## 2014-06-07 MED ORDER — GLUCOSE BLOOD VI STRP: ORAL_STRIP | Status: DC

## 2014-06-09 ENCOUNTER — Other Ambulatory Visit: Payer: Self-pay | Admitting: Family Medicine

## 2014-06-09 MED ORDER — GLUCOSE BLOOD VI STRP: ORAL_STRIP | Status: DC

## 2014-06-09 NOTE — Telephone Encounter (Signed)
Never could reach pt after multiple attempts

## 2014-06-09 NOTE — Addendum Note (Signed)
Addended byDory Peru: RINTELMANN, GINA C on: 06/09/2014 02:33 PM   Modules accepted: Orders

## 2014-06-11 ENCOUNTER — Other Ambulatory Visit: Payer: Self-pay | Admitting: Family Medicine

## 2014-06-11 MED ORDER — GLUCOSE BLOOD VI STRP: ORAL_STRIP | Status: DC

## 2014-06-11 NOTE — Telephone Encounter (Signed)
done

## 2014-06-15 ENCOUNTER — Other Ambulatory Visit: Payer: Self-pay | Admitting: Family Medicine

## 2014-07-15 ENCOUNTER — Other Ambulatory Visit: Payer: Self-pay | Admitting: Family Medicine

## 2014-08-16 ENCOUNTER — Other Ambulatory Visit: Payer: Self-pay | Admitting: Family Medicine

## 2014-08-17 NOTE — Telephone Encounter (Signed)
Last filled 07/02/14, last seen 05/04/14. Call Rx in at [facility] rx

## 2014-08-19 ENCOUNTER — Other Ambulatory Visit: Payer: Self-pay | Admitting: Family Medicine

## 2014-09-07 ENCOUNTER — Ambulatory Visit (INDEPENDENT_AMBULATORY_CARE_PROVIDER_SITE_OTHER): Payer: Medicare Other | Admitting: Family Medicine

## 2014-09-07 ENCOUNTER — Encounter: Payer: Self-pay | Admitting: Family Medicine

## 2014-09-07 VITALS — BP 138/59 | HR 53 | Temp 97.8°F | Ht 67.0 in | Wt 172.0 lb

## 2014-09-07 DIAGNOSIS — I4891 Unspecified atrial fibrillation: Secondary | ICD-10-CM

## 2014-09-07 DIAGNOSIS — N400 Enlarged prostate without lower urinary tract symptoms: Secondary | ICD-10-CM

## 2014-09-07 DIAGNOSIS — Z794 Long term (current) use of insulin: Principal | ICD-10-CM

## 2014-09-07 DIAGNOSIS — I10 Essential (primary) hypertension: Secondary | ICD-10-CM | POA: Diagnosis not present

## 2014-09-07 DIAGNOSIS — E1165 Type 2 diabetes mellitus with hyperglycemia: Principal | ICD-10-CM

## 2014-09-07 DIAGNOSIS — E1065 Type 1 diabetes mellitus with hyperglycemia: Secondary | ICD-10-CM

## 2014-09-07 DIAGNOSIS — I2510 Atherosclerotic heart disease of native coronary artery without angina pectoris: Secondary | ICD-10-CM | POA: Diagnosis not present

## 2014-09-07 DIAGNOSIS — IMO0001 Reserved for inherently not codable concepts without codable children: Secondary | ICD-10-CM

## 2014-09-07 DIAGNOSIS — E559 Vitamin D deficiency, unspecified: Secondary | ICD-10-CM

## 2014-09-07 DIAGNOSIS — E785 Hyperlipidemia, unspecified: Secondary | ICD-10-CM

## 2014-09-07 LAB — POCT GLYCOSYLATED HEMOGLOBIN (HGB A1C): HEMOGLOBIN A1C: 6.6

## 2014-09-07 NOTE — Progress Notes (Signed)
Subjective:    Patient ID: Chad [name], male    DOB: [birth year], 81 y.o.   MRN: [id number]  HPI Pt here for follow up and management of chronic medical problems which includes hypertension, atrial fibrillation, hyperlipidemia, and diabetes. He is taking medications regularly. As usual today the patient has no specific complaints. He will be given an FOBT to return. His vital signs are stable. He indicates that his wife is been diagnosed with lung cancer. He denies any chest pain shortness of breath trouble swallowing heartburn indigestion nausea vomiting diarrhea or changes in his stool. He says his blood pressures have been good at home and that his blood sugars have been running in the 70s. He is not having any trouble passing his water.      Patient Active Problem List   Diagnosis Date Noted  . Thrombocytopenia 05/04/2014  . Long term (current) use of anticoagulants 08/20/2013  . Atrial fibrillation, unspecified 08/19/2013  . BPH (benign prostatic hyperplasia) 04/13/2013  . ASCVD (arteriosclerotic cardiovascular disease) 11/13/2012  . Obstructive sleep apnea 11/13/2012  . HTN (hypertension) 06/12/2012  .  hyperlipidemia 06/12/2012  . Diabetes mellitus, insulin dependent (IDDM), uncontrolled 06/12/2012   Outpatient Encounter Prescriptions as of 09/07/2014  Medication Sig  . allopurinol (ZYLOPRIM) 100 MG tablet Take 100 mg by mouth daily.  Marland Kitchen atenolol (TENORMIN) 50 MG tablet TAKE 1 TABLET BY MOUTH DAILY  . atorvastatin (LIPITOR) 10 MG tablet TAKE 1 TABLET (10 MG TOTAL) BY MOUTH DAILY.  Marland Kitchen Cholecalciferol (VITAMIN D3) 3000 UNITS TABS Take 2,000 Units by mouth daily.  . fish oil-omega-3 fatty acids 1000 MG capsule Take 1 g by mouth daily.  . furosemide (LASIX) 40 MG tablet TAKE 1 TABLET (40 MG TOTAL) BY MOUTH DAILY.  Marland Kitchen glucose blood (ONE TOUCH ULTRA TEST) test strip USE TO CHECK BLOOD SUGAR 2 TO 3 TIMES DAILY. Dx- E10.65  . Insulin Detemir (LEVEMIR FLEXTOUCH) 100 UNIT/ML Pen Inject  40 Units into the skin daily at 10 pm. Inject 40 units into the skin once a day  . LANTUS SOLOSTAR 100 UNIT/ML Solostar Pen INJECT 40 UNITS INTO THE SKIN AT BEDTIME.  Marland Kitchen lisinopril (PRINIVIL,ZESTRIL) 40 MG tablet TAKE ONE TABLET BY MOUTH EVERY DAY  . LORazepam (ATIVAN) 2 MG tablet TAKE  (1)  TABLET  THREE TIMES DAILY AS NEEDED.   No facility-administered encounter medications on file as of 09/07/2014.      Review of Systems  Constitutional: Negative.   HENT: Negative.   Eyes: Negative.   Respiratory: Negative.   Cardiovascular: Negative.   Gastrointestinal: Negative.   Endocrine: Negative.   Genitourinary: Negative.   Musculoskeletal: Negative.   Skin: Negative.   Allergic/Immunologic: Negative.   Neurological: Negative.   Hematological: Negative.   Psychiatric/Behavioral: Negative.        Objective:   Physical Exam  Constitutional: He is oriented to person, place, and time. He appears well-developed and well-nourished. No distress.  HENT:  Head: Normocephalic and atraumatic.  Right Ear: External ear normal.  Left Ear: External ear normal.  Nose: Nose normal.  Mouth/Throat: Oropharynx is clear and moist. No oropharyngeal exudate.  Eyes: Conjunctivae and EOM are normal. Pupils are equal, round, and reactive to light. Right eye exhibits no discharge. Left eye exhibits no discharge. No scleral icterus.  Neck: Normal range of motion. Neck supple. No thyromegaly present.  Cardiovascular: Normal rate, normal heart sounds and intact distal pulses.   No murmur heard. The heart is irregular at 72/m  Pulmonary/Chest: Effort  normal and breath sounds normal. No respiratory distress. He has no wheezes. He has no rales. He exhibits no tenderness.  Clear anteriorly and posteriorly and no axillary adenopathy  Abdominal: Soft. Bowel sounds are normal. He exhibits no mass. There is no tenderness. There is no rebound and no guarding.  Nontender without masses or organ enlargement or tenderness   Musculoskeletal: Normal range of motion. He exhibits no edema or tenderness.  Lymphadenopathy:    He has no cervical adenopathy.  Neurological: He is alert and oriented to person, place, and time. He has normal reflexes. No cranial nerve deficit.  Skin: Skin is warm and dry. No rash noted.  The patient has bilateral toenail fungus  Psychiatric: He has a normal mood and affect. His behavior is normal. Judgment and thought content normal.  Nursing note and vitals reviewed.  BP 138/59 mmHg  Pulse 53  Temp(Src) 97.8 F (36.6 C) (Oral)  Ht 5' 7"  (1.702 m)  Wt 172 lb (78.019 kg)  BMI 26.93 kg/m2        Assessment & Plan:  1. Atrial fibrillation, unspecified -The patient is tolerating this well with no complaint shortness of breath or swelling. - CBC with Differential/Platelet  2. ASCVD (arteriosclerotic cardiovascular disease) -The patient is doing well with no chest pain or tightness he should continue with his current treatment for blood pressure and cholesterol - CBC with Differential/Platelet  3. BPH (benign prostatic hyperplasia) -No complaints with voiding. - CBC with Differential/Platelet  4. Diabetes mellitus, insulin dependent (IDDM), uncontrolled -The patient indicates that his blood sugars at home and they're running in the 70s and there will be no change in treatment of this. - POCT glycosylated hemoglobin (Hb A1C) - CBC with Differential/Platelet  5. Essential hypertension -His blood pressure is good today and he will continue with current treatment - BMP8+EGFR - Hepatic function panel - CBC with Differential/Platelet  6. Hyperlipidemia -Continue with atorvastatin pending results of lab work - Lipid panel - CBC with Differential/Platelet  7. Vitamin D deficiency -Continue with vitamin D 3 treatment pending results of lab work - Vit D  25 hydroxy (rtn osteoporosis monitoring) - CBC with Differential/Platelet   Patient Instructions                        Medicare Annual Wellness Visit  [facility] and the medical providers at [facility] strive to bring you the best medical care.  In doing so we not only want to address your current medical conditions and concerns but also to detect new conditions early and prevent illness, disease and health-related problems.    Medicare offers a yearly Wellness Visit which allows our clinical staff to assess your need for preventative services including immunizations, lifestyle education, counseling to decrease risk of preventable diseases and screening for fall risk and other medical concerns.    This visit is provided free of charge (no copay) for all Medicare recipients. The clinical pharmacists at [facility] have begun to conduct these Wellness Visits which will also include a thorough review of all your medications.    As you primary medical provider recommend that you make an appointment for your Annual Wellness Visit if you have not done so already this year.  You may set up this appointment before you leave today or you may call back ([phone]) and schedule an appointment.  Please make sure when you call that you mention that you are scheduling your Annual  Wellness Visit with the clinical pharmacist so that the appointment may be made for the proper length of time.     Continue current medications. Continue good therapeutic lifestyle changes which include good diet and exercise. Fall precautions discussed with patient. If an FOBT was given today- please return it to our front desk. If you are over 2 years old - you may need Prevnar 29 or the adult Pneumonia vaccine.   After your visit with [place] today you will receive a survey in the mail or online from [employer] regarding your care with [place]. Please take a moment to fill this out. Your feedback is very important to [place] as you can help [place] better understand your patient needs as well as improve your experience  and satisfaction. WE CARE ABOUT YOU!!!   He should continue to monitor his blood pressures and blood sugars regularly at home This summer should drink plenty of fluids and keep well hydrated He really needs to have an appointment with the ophthalmologist to have his eyes checked at least once    Arrie Senate MD

## 2014-09-07 NOTE — Patient Instructions (Addendum)
Medicare Annual Wellness Visit  [facility] and the medical providers at [facility] strive to bring you the best medical care.  In doing so we not only want to address your current medical conditions and concerns but also to detect new conditions early and prevent illness, disease and health-related problems.    Medicare offers a yearly Wellness Visit which allows our clinical staff to assess your need for preventative services including immunizations, lifestyle education, counseling to decrease risk of preventable diseases and screening for fall risk and other medical concerns.    This visit is provided free of charge (no copay) for all Medicare recipients. The clinical pharmacists at [facility] have begun to conduct these Wellness Visits which will also include a thorough review of all your medications.    As you primary medical provider recommend that you make an appointment for your Annual Wellness Visit if you have not done so already this year.  You may set up this appointment before you leave today or you may call back [phone]) and schedule an appointment.  Please make sure when you call that you mention that you are scheduling your Annual Wellness Visit with the clinical pharmacist so that the appointment may be made for the proper length of time.     Continue current medications. Continue good therapeutic lifestyle changes which include good diet and exercise. Fall precautions discussed with patient. If an FOBT was given today- please return it to our front desk. If you are over 22 years old - you may need Prevnar 13 or the adult Pneumonia vaccine.   After your visit with [place] today you will receive a survey in the mail or online from [employer] regarding your care with [place]. Please take a moment to fill this out. Your feedback is very important to [place] as you can help [place] better understand your patient needs as well as  improve your experience and satisfaction. WE CARE ABOUT YOU!!!   He should continue to monitor his blood pressures and blood sugars regularly at home This summer should drink plenty of fluids and keep well hydrated He really needs to have an appointment with the ophthalmologist to have his eyes checked at least once

## 2014-09-08 LAB — CBC WITH DIFFERENTIAL/PLATELET
BASOS ABS: 0 10*3/uL (ref 0.0–0.2)
Basos: 0 %
EOS (ABSOLUTE): 0.2 10*3/uL (ref 0.0–0.4)
Eos: 2 %
Hematocrit: 38.2 % (ref 37.5–51.0)
Hemoglobin: 12.5 g/dL — ABNORMAL LOW (ref 12.6–17.7)
IMMATURE GRANS (ABS): 0 10*3/uL (ref 0.0–0.1)
Immature Granulocytes: 0 %
LYMPHS: 21 %
Lymphocytes Absolute: 2.1 10*3/uL (ref 0.7–3.1)
MCH: 30.9 pg (ref 26.6–33.0)
MCHC: 32.7 g/dL (ref 31.5–35.7)
MCV: 94 fL (ref 79–97)
MONOS ABS: 0.7 10*3/uL (ref 0.1–0.9)
Monocytes: 7 %
NEUTROS ABS: 6.8 10*3/uL (ref 1.4–7.0)
Neutrophils: 70 %
PLATELETS: 162 10*3/uL (ref 150–379)
RBC: 4.05 x10E6/uL — ABNORMAL LOW (ref 4.14–5.80)
RDW: 16.2 % — AB (ref 12.3–15.4)
WBC: 9.8 10*3/uL (ref 3.4–10.8)

## 2014-09-08 LAB — LIPID PANEL
CHOL/HDL RATIO: 2.9 ratio (ref 0.0–5.0)
Cholesterol, Total: 119 mg/dL (ref 100–199)
HDL: 41 mg/dL (ref >39)
LDL Calculated: 67 mg/dL (ref 0–99)
Triglycerides: 56 mg/dL (ref 0–149)
VLDL CHOLESTEROL CAL: 11 mg/dL (ref 5–40)

## 2014-09-08 LAB — BMP8+EGFR
BUN/Creatinine Ratio: 23 — ABNORMAL HIGH (ref 10–22)
BUN: 32 mg/dL — AB (ref 8–27)
CALCIUM: 8.8 mg/dL (ref 8.6–10.2)
CO2: 24 mmol/L (ref 18–29)
Chloride: 101 mmol/L (ref 97–108)
Creatinine, Ser: 1.42 mg/dL — ABNORMAL HIGH (ref 0.76–1.27)
GFR calc Af Amer: 53 mL/min/{1.73_m2} — ABNORMAL LOW (ref >59)
GFR calc non Af Amer: 46 mL/min/{1.73_m2} — ABNORMAL LOW (ref >59)
Glucose: 78 mg/dL (ref 65–99)
Potassium: 4.4 mmol/L (ref 3.5–5.2)
Sodium: 141 mmol/L (ref 134–144)

## 2014-09-08 LAB — HEPATIC FUNCTION PANEL
ALT: 14 IU/L (ref 0–44)
AST: 13 IU/L (ref 0–40)
Albumin: 3.9 g/dL (ref 3.5–4.7)
Alkaline Phosphatase: 80 IU/L (ref 39–117)
BILIRUBIN TOTAL: 0.5 mg/dL (ref 0.0–1.2)
BILIRUBIN, DIRECT: 0.15 mg/dL (ref 0.00–0.40)
Total Protein: 6.2 g/dL (ref 6.0–8.5)

## 2014-09-08 LAB — VITAMIN D 25 HYDROXY (VIT D DEFICIENCY, FRACTURES): VIT D 25 HYDROXY: 53.3 ng/mL (ref 30.0–100.0)

## 2014-09-09 ENCOUNTER — Other Ambulatory Visit: Payer: Self-pay | Admitting: Family Medicine

## 2014-09-29 ENCOUNTER — Other Ambulatory Visit: Payer: Self-pay | Admitting: Family Medicine

## 2014-11-04 ENCOUNTER — Ambulatory Visit (INDEPENDENT_AMBULATORY_CARE_PROVIDER_SITE_OTHER): Payer: Medicare Other

## 2014-11-04 DIAGNOSIS — Z23 Encounter for immunization: Secondary | ICD-10-CM | POA: Diagnosis not present

## 2014-11-09 ENCOUNTER — Other Ambulatory Visit: Payer: Self-pay | Admitting: Family Medicine

## 2014-11-09 NOTE — Telephone Encounter (Signed)
Last filled 09/24/14, last seen 09/07/14. Call in at [facility]

## 2014-11-13 ENCOUNTER — Other Ambulatory Visit: Payer: Self-pay | Admitting: Family Medicine

## 2014-11-18 ENCOUNTER — Other Ambulatory Visit: Payer: Self-pay | Admitting: Family Medicine

## 2014-11-19 ENCOUNTER — Other Ambulatory Visit: Payer: Self-pay | Admitting: Family Medicine

## 2014-12-01 ENCOUNTER — Other Ambulatory Visit: Payer: Self-pay | Admitting: Family Medicine

## 2015-01-18 ENCOUNTER — Telehealth: Payer: Self-pay | Admitting: Family Medicine

## 2015-01-18 NOTE — Telephone Encounter (Signed)
Patients wife aware we do not have any lantus samples.

## 2015-01-20 ENCOUNTER — Ambulatory Visit: Payer: Medicare Other | Admitting: Family Medicine

## 2015-01-21 ENCOUNTER — Ambulatory Visit: Payer: Medicare Other | Admitting: Family Medicine

## 2015-02-08 ENCOUNTER — Other Ambulatory Visit: Payer: Self-pay | Admitting: Family Medicine

## 2015-02-08 MED ORDER — LORAZEPAM 2 MG PO TABS: ORAL_TABLET | ORAL | Status: DC

## 2015-02-08 NOTE — Telephone Encounter (Signed)
Last filled 01/09/15, last seen 09/07/14. Has appt 02/23/15. Call in at [facility]

## 2015-02-10 ENCOUNTER — Telehealth: Payer: Self-pay | Admitting: Family Medicine

## 2015-02-10 MED ORDER — LORAZEPAM 2 MG PO TABS: ORAL_TABLET | ORAL | Status: DC

## 2015-02-10 NOTE — Telephone Encounter (Signed)
Pt aware.

## 2015-02-23 ENCOUNTER — Ambulatory Visit (INDEPENDENT_AMBULATORY_CARE_PROVIDER_SITE_OTHER): Payer: Medicare Other

## 2015-02-23 ENCOUNTER — Ambulatory Visit (INDEPENDENT_AMBULATORY_CARE_PROVIDER_SITE_OTHER): Payer: Medicare Other | Admitting: Family Medicine

## 2015-02-23 ENCOUNTER — Encounter: Payer: Self-pay | Admitting: Family Medicine

## 2015-02-23 VITALS — BP 140/60 | HR 66 | Temp 97.8°F | Ht 67.0 in | Wt 165.0 lb

## 2015-02-23 DIAGNOSIS — I10 Essential (primary) hypertension: Secondary | ICD-10-CM

## 2015-02-23 DIAGNOSIS — E790 Hyperuricemia without signs of inflammatory arthritis and tophaceous disease: Secondary | ICD-10-CM

## 2015-02-23 DIAGNOSIS — D692 Other nonthrombocytopenic purpura: Secondary | ICD-10-CM | POA: Diagnosis not present

## 2015-02-23 DIAGNOSIS — E559 Vitamin D deficiency, unspecified: Secondary | ICD-10-CM

## 2015-02-23 DIAGNOSIS — I481 Persistent atrial fibrillation: Secondary | ICD-10-CM | POA: Diagnosis not present

## 2015-02-23 DIAGNOSIS — E785 Hyperlipidemia, unspecified: Secondary | ICD-10-CM | POA: Diagnosis not present

## 2015-02-23 DIAGNOSIS — N400 Enlarged prostate without lower urinary tract symptoms: Secondary | ICD-10-CM | POA: Diagnosis not present

## 2015-02-23 DIAGNOSIS — I2510 Atherosclerotic heart disease of native coronary artery without angina pectoris: Secondary | ICD-10-CM

## 2015-02-23 DIAGNOSIS — E1065 Type 1 diabetes mellitus with hyperglycemia: Secondary | ICD-10-CM

## 2015-02-23 DIAGNOSIS — I700 Atherosclerosis of aorta: Secondary | ICD-10-CM

## 2015-02-23 DIAGNOSIS — D696 Thrombocytopenia, unspecified: Secondary | ICD-10-CM

## 2015-02-23 DIAGNOSIS — I4819 Other persistent atrial fibrillation: Secondary | ICD-10-CM

## 2015-02-23 DIAGNOSIS — E108 Type 1 diabetes mellitus with unspecified complications: Secondary | ICD-10-CM

## 2015-02-23 DIAGNOSIS — IMO0002 Reserved for concepts with insufficient information to code with codable children: Secondary | ICD-10-CM

## 2015-02-23 LAB — POCT GLYCOSYLATED HEMOGLOBIN (HGB A1C): Hemoglobin A1C: 6.7

## 2015-02-23 NOTE — Patient Instructions (Addendum)
Medicare Annual Wellness Visit  [place] and the medical providers at [facility] strive to bring you the best medical care.  In doing so we not only want to address your current medical conditions and concerns but also to detect new conditions early and prevent illness, disease and health-related problems.    Medicare offers a yearly Wellness Visit which allows our clinical staff to assess your need for preventative services including immunizations, lifestyle education, counseling to decrease risk of preventable diseases and screening for fall risk and other medical concerns.    This visit is provided free of charge (no copay) for all Medicare recipients. The clinical pharmacists at [facility] have begun to conduct these Wellness Visits which will also include a thorough review of all your medications.    As you primary medical provider recommend that you make an appointment for your Annual Wellness Visit if you have not done so already this year.  You may set up this appointment before you leave today or you may call back ([phone]) and schedule an appointment.  Please make sure when you call that you mention that you are scheduling your Annual Wellness Visit with the clinical pharmacist so that the appointment may be made for the proper length of time.     Continue current medications. Continue good therapeutic lifestyle changes which include good diet and exercise. Fall precautions discussed with patient. If an FOBT was given today- please return it to our front desk. If you are over 67 years old - you may need Prevnar 13 or the adult Pneumonia vaccine.  **Flu shots are available--- please call and schedule a FLU-CLINIC appointment**  After your visit with [place] today you will receive a survey in the mail or online from [employer] regarding your care with [place]. Please take a moment to fill this out. Your feedback is very  important to [place] as you can help [place] better understand your patient needs as well as improve your experience and satisfaction. WE CARE ABOUT YOU!!!   The patient should continue to follow his diet closely and take his current medications He should drink plenty of fluids and stay well hydrated for the remainder of the winter. He should continue to check his feet closely. He should return the FOBT and we will call him with the results of the lab work and a chest x-ray results as soon as they become available  We will arrange a Appt with Dr Antoine Poche (cadiology) in the [place] office Go Get a EYE EXAM

## 2015-02-23 NOTE — Progress Notes (Signed)
Subjective:    Patient ID: Chad [name], male    DOB: 02-23-1931, 82 y.o.   MRN: [id number]  HPI Pt here for follow up and management of chronic medical problems which includes diabetes, hypertension and hyperlipidemia. He is taking medications regularly. The patient is doing well overall. Patient usually comes to the doctor with no complaints. He is due to return an FOBT get a chest x-ray get routine lab work today. It is important to note that his weight is down 11 pounds since November. The patient has not seen a cardiologist since he used to see Dr. Rollene Fare. We will arrange for him to see the cardiologist that comes to [facility] just for routine follow-up. He is not having any symptoms. He denies chest pain shortness of breath trouble swallowing heartburn indigestion and nausea vomiting diarrhea or blood in the stool. He does have bruising. He denies trouble passing his water other than recently had to take some antibiotics and those symptoms have dissipated.       Patient Active Problem List   Diagnosis Date Noted  . Thrombocytopenia ([place]) 05/04/2014  . Long term (current) use of anticoagulants 08/20/2013  . Atrial fibrillation, unspecified 08/19/2013  . BPH (benign prostatic hyperplasia) 04/13/2013  . ASCVD (arteriosclerotic cardiovascular disease) 11/13/2012  . Obstructive sleep apnea 11/13/2012  . HTN (hypertension) 06/12/2012  .  hyperlipidemia 06/12/2012  . Diabetes mellitus, insulin dependent (IDDM), uncontrolled ([place]) 06/12/2012   Outpatient Encounter Prescriptions as of 02/23/2015  Medication Sig  . allopurinol (ZYLOPRIM) 100 MG tablet Take 100 mg by mouth daily.  Marland Kitchen atenolol (TENORMIN) 50 MG tablet TAKE 1 TABLET BY MOUTH DAILY  . atorvastatin (LIPITOR) 10 MG tablet TAKE 1 TABLET (10 MG TOTAL) BY MOUTH DAILY.  Marland Kitchen Cholecalciferol (VITAMIN D3) 3000 UNITS TABS Take 2,000 Units by mouth daily.  . fish oil-omega-3 fatty acids 1000 MG capsule Take 1 g by mouth daily.  .  furosemide (LASIX) 40 MG tablet TAKE 1 TABLET (40 MG TOTAL) BY MOUTH DAILY.  Marland Kitchen glucose blood (ONE TOUCH ULTRA TEST) test strip USE TO CHECK BLOOD SUGAR 2 TO 3 TIMES DAILY. Dx- E10.65  . Insulin Detemir (LEVEMIR FLEXTOUCH) 100 UNIT/ML Pen Inject 40 Units into the skin daily at 10 pm. Inject 40 units into the skin once a day  . LANTUS SOLOSTAR 100 UNIT/ML Solostar Pen INJECT 40 UNITS INTO THE SKIN AT BEDTIME.  Marland Kitchen lisinopril (PRINIVIL,ZESTRIL) 40 MG tablet TAKE ONE TABLET BY MOUTH EVERY DAY  . LORazepam (ATIVAN) 2 MG tablet TAKE  (1)  TABLET  THREE TIMES DAILY AS NEEDED.  . [DISCONTINUED] atorvastatin (LIPITOR) 10 MG tablet TAKE 1 TABLET (10 MG TOTAL) BY MOUTH DAILY.  . [DISCONTINUED] furosemide (LASIX) 40 MG tablet TAKE 1 TABLET (40 MG TOTAL) BY MOUTH DAILY.   No facility-administered encounter medications on file as of 02/23/2015.     Review of Systems  Constitutional: Negative.   HENT: Negative.   Eyes: Negative.   Respiratory: Negative.   Cardiovascular: Negative.   Gastrointestinal: Negative.   Endocrine: Negative.   Genitourinary: Negative.   Musculoskeletal: Negative.   Skin: Negative.   Allergic/Immunologic: Negative.   Neurological: Negative.   Hematological: Negative.   Psychiatric/Behavioral: Negative.        Objective:   Physical Exam  Constitutional: He is oriented to person, place, and time. He appears well-developed and well-nourished. No distress.  HENT:  Head: Normocephalic and atraumatic.  Right Ear: External ear normal.  Left Ear: External ear normal.  Nose:  Nose normal.  Mouth/Throat: Oropharynx is clear and moist. No oropharyngeal exudate.  Eyes: Conjunctivae and EOM are normal. Pupils are equal, round, and reactive to light. Right eye exhibits no discharge. Left eye exhibits no discharge. No scleral icterus.  Neck: Normal range of motion. Neck supple. No thyromegaly present.  No carotid bruits were audible  Cardiovascular: Normal rate, normal heart sounds and  intact distal pulses.   No murmur heard. Heart is irregular irregular at 60/m  Pulmonary/Chest: Effort normal and breath sounds normal. No respiratory distress. He has no wheezes. He has no rales. He exhibits no tenderness.  Clear anteriorly and posteriorly  Abdominal: Soft. Bowel sounds are normal. He exhibits no mass. There is no tenderness. There is no rebound and no guarding.  Genitourinary: Rectum normal.  Musculoskeletal: Normal range of motion. He exhibits no edema.  Lymphadenopathy:    He has no cervical adenopathy.  Neurological: He is alert and oriented to person, place, and time. He has normal reflexes. No cranial nerve deficit.  Skin: Skin is warm and dry. No rash noted.  Patient continues to have some bruising on both arms  Psychiatric: He has a normal mood and affect. His behavior is normal. Judgment and thought content normal.  Nursing note and vitals reviewed.   BP 141/52 mmHg  Pulse 66  Temp(Src) 97.8 F (36.6 C) (Oral)  Ht 5' 7" (1.702 m)  Wt 165 lb (74.844 kg)  BMI 25.84 kg/m2  [facility] reading (PRIMARY) by  Dr.Jeannene Tschetter-chest x-ray, degenerative changes and atherosclerosis of the thoracic aorta                                       Assessment & Plan:  1. ASCVD (arteriosclerotic cardiovascular disease) -We will arrange a follow-up visit with the cardiologist just for routine purposes even though the patient is not having any symptoms or chest pain. - CBC with Differential/Platelet  2. Persistent atrial fibrillation ([place]) -Patient continues to have atrial fibrillation and is not taking any kind of maintenance treatment for this and we would ask the cardiologist to address this with him. - CBC with Differential/Platelet  3. BPH (benign prostatic hyperplasia) -Follow-up with urology as needed - CBC with Differential/Platelet  4. Uncontrolled type 1 diabetes mellitus with complication (HCC) -Continue current treatment pending results of lab work. - POCT  glycosylated hemoglobin (Hb A1C) - BMP8+EGFR - CBC with Differential/Platelet  5. Essential hypertension -The systolic blood pressure is 140 today we will not make any changes in his treatment he will continue current treatment. - BMP8+EGFR - CBC with Differential/Platelet - Hepatic function panel - DG Chest 2 View; Future  6. Vitamin D deficiency -Continue current treatment pending results of lab work - CBC with Differential/Platelet - VITAMIN D 25 Hydroxy (Vit-D Deficiency, Fractures)  7. Hyperlipidemia -Continue with atorvastatin and omega-3 fatty acids and exercise and diet as much as possible - CBC with Differential/Platelet - NMR, lipoprofile  8. Hyperuricemia -He has no symptoms of gout but we will check a uric acid today.  9. Hyperlipidemia LDL goal <70 -Continue current treatment pending results of lab work  10. Senile purpura  11. Thoracic aortic atherosclerosis  Patient Instructions                       Medicare Annual Wellness Visit  [place] and the medical providers at [facility] strive to bring  you the best medical care.  In doing so we not only want to address your current medical conditions and concerns but also to detect new conditions early and prevent illness, disease and health-related problems.    Medicare offers a yearly Wellness Visit which allows our clinical staff to assess your need for preventative services including immunizations, lifestyle education, counseling to decrease risk of preventable diseases and screening for fall risk and other medical concerns.    This visit is provided free of charge (no copay) for all Medicare recipients. The clinical pharmacists at [facility] have begun to conduct these Wellness Visits which will also include a thorough review of all your medications.    As you primary medical provider recommend that you make an appointment for your Annual Wellness Visit if you  have not done so already this year.  You may set up this appointment before you leave today or you may call back ([phone]) and schedule an appointment.  Please make sure when you call that you mention that you are scheduling your Annual Wellness Visit with the clinical pharmacist so that the appointment may be made for the proper length of time.     Continue current medications. Continue good therapeutic lifestyle changes which include good diet and exercise. Fall precautions discussed with patient. If an FOBT was given today- please return it to our front desk. If you are over 68 years old - you may need Prevnar 23 or the adult Pneumonia vaccine.  **Flu shots are available--- please call and schedule a FLU-CLINIC appointment**  After your visit with [place] today you will receive a survey in the mail or online from [employer] regarding your care with [place]. Please take a moment to fill this out. Your feedback is very important to [place] as you can help [place] better understand your patient needs as well as improve your experience and satisfaction. WE CARE ABOUT YOU!!!   The patient should continue to follow his diet closely and take his current medications He should drink plenty of fluids and stay well hydrated for the remainder of the winter. He should continue to check his feet closely. He should return the FOBT and we will call him with the results of the lab work and a chest x-ray results as soon as they become available    Arrie Senate MD

## 2015-02-24 ENCOUNTER — Other Ambulatory Visit: Payer: Self-pay | Admitting: *Deleted

## 2015-02-24 DIAGNOSIS — E785 Hyperlipidemia, unspecified: Secondary | ICD-10-CM

## 2015-02-24 LAB — BMP8+EGFR
BUN / CREAT RATIO: 28 — AB (ref 10–22)
BUN: 41 mg/dL — ABNORMAL HIGH (ref 8–27)
CO2: 23 mmol/L (ref 18–29)
CREATININE: 1.44 mg/dL — AB (ref 0.76–1.27)
Calcium: 8.9 mg/dL (ref 8.6–10.2)
Chloride: 101 mmol/L (ref 96–106)
GFR calc Af Amer: 52 mL/min/{1.73_m2} — ABNORMAL LOW (ref >59)
GFR, EST NON AFRICAN AMERICAN: 45 mL/min/{1.73_m2} — AB (ref >59)
GLUCOSE: 71 mg/dL (ref 65–99)
Potassium: 5 mmol/L (ref 3.5–5.2)
SODIUM: 142 mmol/L (ref 134–144)

## 2015-02-24 LAB — HEPATIC FUNCTION PANEL
ALT: 14 IU/L (ref 0–44)
AST: 17 IU/L (ref 0–40)
Albumin: 3.9 g/dL (ref 3.5–4.7)
Alkaline Phosphatase: 74 IU/L (ref 39–117)
Bilirubin Total: 0.3 mg/dL (ref 0.0–1.2)
Bilirubin, Direct: 0.18 mg/dL (ref 0.00–0.40)
Total Protein: 6.5 g/dL (ref 6.0–8.5)

## 2015-02-24 LAB — CBC WITH DIFFERENTIAL/PLATELET
BASOS: 0 %
Basophils Absolute: 0 10*3/uL (ref 0.0–0.2)
EOS (ABSOLUTE): 0.1 10*3/uL (ref 0.0–0.4)
EOS: 1 %
HEMATOCRIT: 40.4 % (ref 37.5–51.0)
HEMOGLOBIN: 13.2 g/dL (ref 12.6–17.7)
Immature Grans (Abs): 0 10*3/uL (ref 0.0–0.1)
Immature Granulocytes: 0 %
LYMPHS ABS: 2.3 10*3/uL (ref 0.7–3.1)
Lymphs: 27 %
MCH: 32 pg (ref 26.6–33.0)
MCHC: 32.7 g/dL (ref 31.5–35.7)
MCV: 98 fL — ABNORMAL HIGH (ref 79–97)
MONOS ABS: 0.5 10*3/uL (ref 0.1–0.9)
Monocytes: 6 %
Neutrophils Absolute: 5.5 10*3/uL (ref 1.4–7.0)
Neutrophils: 66 %
Platelets: 118 10*3/uL — ABNORMAL LOW (ref 150–379)
RBC: 4.12 x10E6/uL — ABNORMAL LOW (ref 4.14–5.80)
RDW: 16.4 % — ABNORMAL HIGH (ref 12.3–15.4)
WBC: 8.3 10*3/uL (ref 3.4–10.8)

## 2015-02-24 LAB — NMR, LIPOPROFILE
Cholesterol: 174 mg/dL (ref 100–199)
HDL Cholesterol by NMR: 46 mg/dL (ref >39)
HDL Particle Number: 24.4 umol/L — ABNORMAL LOW (ref >=30.5)
LDL PARTICLE NUMBER: 1405 nmol/L — AB (ref <1000)
LDL Size: 21.4 nm (ref >20.5)
LDL-C: 113 mg/dL — AB (ref 0–99)
LP-IR Score: <25 (ref <=45)
Small LDL Particle Number: 416 nmol/L (ref <=527)
Triglycerides by NMR: 77 mg/dL (ref 0–149)

## 2015-02-24 LAB — VITAMIN D 25 HYDROXY (VIT D DEFICIENCY, FRACTURES): VIT D 25 HYDROXY: 55.2 ng/mL (ref 30.0–100.0)

## 2015-02-24 MED ORDER — ATORVASTATIN CALCIUM 20 MG PO TABS: 20.0000 mg | ORAL_TABLET | Freq: Every day | ORAL | Status: DC

## 2015-03-09 ENCOUNTER — Other Ambulatory Visit: Payer: Self-pay | Admitting: Family Medicine

## 2015-03-10 DIAGNOSIS — E119 Type 2 diabetes mellitus without complications: Secondary | ICD-10-CM | POA: Diagnosis not present

## 2015-03-21 ENCOUNTER — Other Ambulatory Visit: Payer: Self-pay | Admitting: Family Medicine

## 2015-04-05 ENCOUNTER — Other Ambulatory Visit: Payer: Self-pay | Admitting: Family Medicine

## 2015-04-06 NOTE — Telephone Encounter (Signed)
Last seen 02/23/15  Dr Christell ConstantMoore  If approved [facility]

## 2015-04-13 ENCOUNTER — Other Ambulatory Visit: Payer: Self-pay | Admitting: Family Medicine

## 2015-04-13 ENCOUNTER — Ambulatory Visit: Payer: Self-pay | Admitting: Cardiology

## 2015-04-18 ENCOUNTER — Encounter: Payer: Self-pay | Admitting: Cardiology

## 2015-04-22 ENCOUNTER — Ambulatory Visit (INDEPENDENT_AMBULATORY_CARE_PROVIDER_SITE_OTHER): Payer: Medicare Other

## 2015-04-22 ENCOUNTER — Ambulatory Visit (INDEPENDENT_AMBULATORY_CARE_PROVIDER_SITE_OTHER): Payer: Medicare Other | Admitting: Family Medicine

## 2015-04-22 ENCOUNTER — Encounter: Payer: Self-pay | Admitting: Family Medicine

## 2015-04-22 VITALS — BP 141/63 | HR 64 | Temp 97.5°F | Ht 67.0 in | Wt 162.2 lb

## 2015-04-22 DIAGNOSIS — J189 Pneumonia, unspecified organism: Secondary | ICD-10-CM

## 2015-04-22 DIAGNOSIS — R059 Cough, unspecified: Secondary | ICD-10-CM

## 2015-04-22 DIAGNOSIS — R05 Cough: Secondary | ICD-10-CM | POA: Diagnosis not present

## 2015-04-22 MED ORDER — GUAIFENESIN-CODEINE 100-10 MG/5ML PO SYRP: 5.0000 mL | ORAL_SOLUTION | ORAL | Status: DC | PRN

## 2015-04-22 MED ORDER — LEVOFLOXACIN 500 MG PO TABS: 500.0000 mg | ORAL_TABLET | Freq: Every day | ORAL | Status: DC

## 2015-04-22 NOTE — Progress Notes (Signed)
Subjective:  Patient ID: Chad [name], male    DOB: 05/07/1931  Age: 82 y.o. MRN: [id number]  CC: URI   HPI Chad [name] presents for Patient presents with upper respiratory congestion. Rhinorrhea that is frequently purulent. There is moderate sore throat. Patient reports coughing frequently as well.-colored/purulent sputum noted. There is no fever no chills no sweats. The patient denies being short of breath. Onset was 3-5 days ago. Gradually worsening in spite of home remedies.    History Chad [name] has a past medical history of Diabetes mellitus without complication (HCC); Stroke [facility]); Hyperlipidemia; Hypertension; Gout; 1St degree AV block; and Neuropathy (HCC).   Chad [name] has past surgical history that includes carpel tunnel (Bilateral); Kidney stone surgery; Coronary artery bypass graft; and Parathyroidectomy.   His family history includes CAD (age of onset: 17) in his daughter; CAD (age of onset: 11) in his brother; Cancer in his father.Chad [name] reports that Chad [name] quit smoking about 37 years ago. His smoking use included Cigarettes. Chad [name] does not have any smokeless tobacco history on file. Chad [name] reports that Chad [name] does not drink alcohol or use illicit drugs.    ROS Review of Systems  Constitutional: Negative for fever, chills, activity change and appetite change.  HENT: Positive for congestion, postnasal drip, rhinorrhea and sinus pressure. Negative for ear discharge, ear pain, hearing loss, nosebleeds, sneezing and trouble swallowing.   Respiratory: Negative for chest tightness and shortness of breath.   Cardiovascular: Negative for chest pain and palpitations.  Skin: Negative for rash.    Objective:  BP 141/63 mmHg  Pulse 64  Temp(Src) 97.5 F (36.4 C) (Oral)  Ht  (1.702 m)  Wt 162 lb 3.2 oz (73.573 kg)  BMI 25.40 kg/m2  SpO2 94%  BP Readings from Last 3 Encounters:  04/22/15 141/63  02/23/15 140/60  09/07/14 138/59    Wt Readings from Last 3 Encounters:  04/22/15 162 lb  3.2 oz (73.573 kg)  02/23/15 165 lb (74.844 kg)  09/07/14 172 lb (78.019 kg)     Physical Exam  Constitutional: Chad [name] appears well-developed and well-nourished.  HENT:  Head: Normocephalic and atraumatic.  Right Ear: Tympanic membrane and external ear normal. No decreased hearing is noted.  Left Ear: Tympanic membrane and external ear normal. No decreased hearing is noted.  Nose: Mucosal edema present. Right sinus exhibits no frontal sinus tenderness. Left sinus exhibits no frontal sinus tenderness.  Mouth/Throat: No oropharyngeal exudate or posterior oropharyngeal erythema.  Neck: No Brudzinski's sign noted.  Pulmonary/Chest: Breath sounds normal. No respiratory distress.  Lymphadenopathy:       Head (right side): No preauricular adenopathy present.       Head (left side): No preauricular adenopathy present.       Right cervical: No superficial cervical adenopathy present.      Left cervical: No superficial cervical adenopathy present.     Lab Results  Component Value Date   WBC 8.3 02/23/2015   HGB 14.0* 05/04/2014   HCT 40.4 02/23/2015   PLT 118* 02/23/2015   GLUCOSE 71 02/23/2015   CHOL 174 02/23/2015   TRIG 77 02/23/2015   HDL 46 02/23/2015   LDLCALC 67 09/07/2014   ALT 14 02/23/2015   AST 17 02/23/2015   NA 142 02/23/2015   K 5.0 02/23/2015   CL 101 02/23/2015   CREATININE 1.44* 02/23/2015   BUN 41* 02/23/2015   CO2 23 02/23/2015   TSH 1.830 08/19/2013   INR 0.9 08/24/2013   HGBA1C 6.7 02/23/2015  [place] Renal  11/20/2010  *RADIOLOGY REPORT* Clinical Data: Renal insufficiency. RENAL/URINARY TRACT ULTRASOUND COMPLETE Comparison:  None. Findings: Right Kidney:  Measures 9.8 cm in length.  No stone, mass or hydronephrosis. Left Kidney:  Measures 9.9 cm in length.  No stone, mass or hydronephrosis. Bladder:  Unremarkable. IMPRESSION: Negative exam. Original Report Authenticated By: Bernadene BellHOMAS L. Maricela Curet'ALESSIO, M.D.   Assessment & Plan:   Chad [name] was seen today for  uri.  Diagnoses and all orders for this visit:  Pneumonia due to organism  Cough -     DG Chest 2 View; Future  Other orders -     levofloxacin (LEVAQUIN) 500 MG tablet; Take 1 tablet (500 mg total) by mouth daily. -     guaiFENesin-codeine (CHERATUSSIN AC) 100-10 MG/5ML syrup; Take 5 mLs by mouth every 4 (four) hours as needed for cough.      I have discontinued Mr. Kunin's Insulin Detemir. I am also having him start on levofloxacin and guaiFENesin-codeine. Additionally, I am having him maintain his fish oil-omega-3 fatty acids, allopurinol, Vitamin D3, lisinopril, atorvastatin, ONE TOUCH ULTRA TEST, furosemide, atenolol, LORazepam, and LANTUS SOLOSTAR.  Meds ordered this encounter  Medications  . levofloxacin (LEVAQUIN) 500 MG tablet    Sig: Take 1 tablet (500 mg total) by mouth daily.    Dispense:  10 tablet    Refill:  0  . guaiFENesin-codeine (CHERATUSSIN AC) 100-10 MG/5ML syrup    Sig: Take 5 mLs by mouth every 4 (four) hours as needed for cough.    Dispense:  180 mL    Refill:  0     Follow-up: Return in about 5 days (around 04/27/2015).  Mechele ClaudeWarren Sharnae Winfree, M.D.

## 2015-04-27 ENCOUNTER — Ambulatory Visit: Payer: Medicare Other | Admitting: Family Medicine

## 2015-04-28 ENCOUNTER — Encounter: Payer: Self-pay | Admitting: Family Medicine

## 2015-05-27 ENCOUNTER — Ambulatory Visit: Payer: Medicare Other | Admitting: Family Medicine

## 2015-05-30 ENCOUNTER — Encounter: Payer: Self-pay | Admitting: Family Medicine

## 2015-06-02 ENCOUNTER — Encounter: Payer: Self-pay | Admitting: Family Medicine

## 2015-06-02 ENCOUNTER — Telehealth: Payer: Self-pay | Admitting: Family Medicine

## 2015-06-02 ENCOUNTER — Ambulatory Visit (INDEPENDENT_AMBULATORY_CARE_PROVIDER_SITE_OTHER): Payer: Medicare Other | Admitting: Family Medicine

## 2015-06-02 VITALS — BP 131/63 | HR 91 | Temp 96.9°F | Ht 67.0 in | Wt 149.0 lb

## 2015-06-02 DIAGNOSIS — R197 Diarrhea, unspecified: Secondary | ICD-10-CM

## 2015-06-02 DIAGNOSIS — E119 Type 2 diabetes mellitus without complications: Secondary | ICD-10-CM | POA: Diagnosis not present

## 2015-06-02 MED ORDER — CHOLESTYRAMINE LIGHT 4 G PO PACK: 4.0000 g | PACK | Freq: Two times a day (BID) | ORAL | Status: DC

## 2015-06-02 NOTE — Progress Notes (Signed)
Subjective:    Patient ID: Chad [name], male    DOB: 1932/01/03, 82 y.o.   MRN: [id number]  HPI Patient here today for diarrhea that started about 2.5 weeks ago. He is accompanied today by his daughter. There is been no blood in the diarrhea and no abdominal pain. Stools are described as watery vitamin Sammy solid. He did take Levaquin prior to the onset of diarrhea. He has used Imodium for relief of symptoms.     Patient Active Problem List   Diagnosis Date Noted  . Hyperuricemia 02/23/2015  . Thoracic aorta atherosclerosis (HCC) 02/23/2015  . Senile purpura (HCC) 02/23/2015  . Thrombocytopenia (HCC) 05/04/2014  . Long term (current) use of anticoagulants 08/20/2013  . Atrial fibrillation, unspecified 08/19/2013  . BPH (benign prostatic hyperplasia) 04/13/2013  . ASCVD (arteriosclerotic cardiovascular disease) 11/13/2012  . Obstructive sleep apnea 11/13/2012  . HTN (hypertension) 06/12/2012  . Hyperlipidemia LDL goal <70 06/12/2012  . Diabetes mellitus, insulin dependent (IDDM), uncontrolled (HCC) 06/12/2012   Outpatient Encounter Prescriptions as of 06/02/2015  Medication Sig  . allopurinol (ZYLOPRIM) 100 MG tablet Take 100 mg by mouth daily.  Marland Kitchen. atenolol (TENORMIN) 50 MG tablet TAKE ONE TABLET BY MOUTH ONCE DAILY  . atorvastatin (LIPITOR) 20 MG tablet Take 1 tablet (20 mg total) by mouth daily at 6 PM.  . Cholecalciferol (VITAMIN D3) 3000 UNITS TABS Take 2,000 Units by mouth daily.  . fish oil-omega-3 fatty acids 1000 MG capsule Take 1 g by mouth daily.  . furosemide (LASIX) 40 MG tablet TAKE ONE TABLET BY MOUTH ONCE DAILY  . LANTUS SOLOSTAR 100 UNIT/ML Solostar Pen INJECT 40 UNITS INTO THE SKIN AT BEDTIME  . lisinopril (PRINIVIL,ZESTRIL) 40 MG tablet TAKE ONE TABLET BY MOUTH EVERY DAY  . LORazepam (ATIVAN) 2 MG tablet TAKE ONE TABLET BY MOUTH THREE TIMES DAILY AS NEEDED  . ONE TOUCH ULTRA TEST test strip USE ONE STRIP TO CHECK GLUCOSE TWICE DAILY TO THREE TIMES DAILY  .  [DISCONTINUED] guaiFENesin-codeine (CHERATUSSIN AC) 100-10 MG/5ML syrup Take 5 mLs by mouth every 4 (four) hours as needed for cough.  . [DISCONTINUED] levofloxacin (LEVAQUIN) 500 MG tablet Take 1 tablet (500 mg total) by mouth daily.   No facility-administered encounter medications on file as of 06/02/2015.      Review of Systems  HENT: Negative.   Eyes: Negative.   Respiratory: Negative.   Cardiovascular: Negative.   Gastrointestinal: Positive for diarrhea.  Endocrine: Negative.   Genitourinary: Negative.   Musculoskeletal: Negative.   Skin: Negative.   Allergic/Immunologic: Negative.   Neurological: Positive for weakness.  Hematological: Negative.   Psychiatric/Behavioral: Negative.        Objective:   Physical Exam  Constitutional: He is oriented to person, place, and time. He appears well-developed and well-nourished.  HENT:  Mouth/Throat: Oropharynx is clear and moist.  Abdominal: Soft. Bowel sounds are normal. He exhibits no distension. There is no tenderness.  Neurological: He is alert and oriented to person, place, and time.  Psychiatric: He has a normal mood and affect.   BP 131/63 mmHg  Pulse 91  Temp(Src) 96.9 F (36.1 C) (Oral)  Ht 5\' 7"  (1.702 m)  Wt 149 lb (67.586 kg)  BMI 23.33 kg/m2        Assessment & Plan:  1. Diarrhea, unspecified type Antibiotic use probably had some causative effect. Consider C. difficile versus alteration of other normal flora. Will order stool for C. difficile, probiotic, and cholestyramine for symptom relief.  Bertram MillardStephen M  Sabra Heck MD

## 2015-06-05 ENCOUNTER — Emergency Department (HOSPITAL_COMMUNITY)
Admission: EM | Admit: 2015-06-05 | Discharge: 2015-06-05 | Disposition: A | Discharge status: Home / Self Care | Payer: Medicare Other | Attending: Emergency Medicine | Admitting: Emergency Medicine

## 2015-06-05 ENCOUNTER — Encounter (HOSPITAL_COMMUNITY): Payer: Self-pay | Admitting: *Deleted

## 2015-06-05 ENCOUNTER — Emergency Department (HOSPITAL_COMMUNITY): Payer: Medicare Other

## 2015-06-05 DIAGNOSIS — E119 Type 2 diabetes mellitus without complications: Secondary | ICD-10-CM | POA: Insufficient documentation

## 2015-06-05 DIAGNOSIS — A0472 Enterocolitis due to Clostridium difficile, not specified as recurrent: Secondary | ICD-10-CM

## 2015-06-05 DIAGNOSIS — K8020 Calculus of gallbladder without cholecystitis without obstruction: Secondary | ICD-10-CM | POA: Diagnosis not present

## 2015-06-05 DIAGNOSIS — I10 Essential (primary) hypertension: Secondary | ICD-10-CM | POA: Diagnosis not present

## 2015-06-05 DIAGNOSIS — E785 Hyperlipidemia, unspecified: Secondary | ICD-10-CM | POA: Diagnosis not present

## 2015-06-05 DIAGNOSIS — Z87891 Personal history of nicotine dependence: Secondary | ICD-10-CM | POA: Diagnosis not present

## 2015-06-05 DIAGNOSIS — A047 Enterocolitis due to Clostridium difficile: Secondary | ICD-10-CM | POA: Diagnosis not present

## 2015-06-05 DIAGNOSIS — Z79899 Other long term (current) drug therapy: Secondary | ICD-10-CM | POA: Insufficient documentation

## 2015-06-05 DIAGNOSIS — R197 Diarrhea, unspecified: Secondary | ICD-10-CM | POA: Diagnosis present

## 2015-06-05 DIAGNOSIS — Z794 Long term (current) use of insulin: Secondary | ICD-10-CM | POA: Insufficient documentation

## 2015-06-05 DIAGNOSIS — Z8673 Personal history of transient ischemic attack (TIA), and cerebral infarction without residual deficits: Secondary | ICD-10-CM | POA: Insufficient documentation

## 2015-06-05 LAB — URINALYSIS, ROUTINE W REFLEX MICROSCOPIC
Bilirubin Urine: NEGATIVE
Glucose, UA: NEGATIVE mg/dL
LEUKOCYTES UA: NEGATIVE
NITRITE: NEGATIVE
PH: 5.5 (ref 5.0–8.0)
Protein, ur: NEGATIVE mg/dL

## 2015-06-05 LAB — CBC WITH DIFFERENTIAL/PLATELET
BASOS ABS: 0 10*3/uL (ref 0.0–0.1)
BASOS PCT: 0 %
EOS ABS: 0 10*3/uL (ref 0.0–0.7)
EOS PCT: 0 %
HCT: 38.1 % — ABNORMAL LOW (ref 39.0–52.0)
Hemoglobin: 12.9 g/dL — ABNORMAL LOW (ref 13.0–17.0)
Lymphocytes Relative: 12 %
Lymphs Abs: 2.4 10*3/uL (ref 0.7–4.0)
MCH: 31.5 pg (ref 26.0–34.0)
MCHC: 33.9 g/dL (ref 30.0–36.0)
MCV: 92.9 fL (ref 78.0–100.0)
MONO ABS: 1.1 10*3/uL — AB (ref 0.1–1.0)
Monocytes Relative: 6 %
Neutro Abs: 16.8 10*3/uL — ABNORMAL HIGH (ref 1.7–7.7)
Neutrophils Relative %: 82 %
PLATELETS: 239 10*3/uL (ref 150–400)
RBC: 4.1 MIL/uL — AB (ref 4.22–5.81)
RDW: 15.3 % (ref 11.5–15.5)
WBC: 20.4 10*3/uL — AB (ref 4.0–10.5)

## 2015-06-05 LAB — C DIFFICILE QUICK SCREEN W PCR REFLEX
C DIFFICILE (CDIFF) TOXIN: NEGATIVE
C Diff antigen: POSITIVE — AB

## 2015-06-05 LAB — COMPREHENSIVE METABOLIC PANEL
ALT: 14 U/L — AB (ref 17–63)
AST: 23 U/L (ref 15–41)
Albumin: 2.8 g/dL — ABNORMAL LOW (ref 3.5–5.0)
Alkaline Phosphatase: 60 U/L (ref 38–126)
Anion gap: 9 (ref 5–15)
BUN: 34 mg/dL — AB (ref 6–20)
CHLORIDE: 99 mmol/L — AB (ref 101–111)
CO2: 22 mmol/L (ref 22–32)
CREATININE: 1.2 mg/dL (ref 0.61–1.24)
Calcium: 8.4 mg/dL — ABNORMAL LOW (ref 8.9–10.3)
GFR, EST NON AFRICAN AMERICAN: 54 mL/min — AB (ref >60)
Glucose, Bld: 116 mg/dL — ABNORMAL HIGH (ref 65–99)
POTASSIUM: 3.2 mmol/L — AB (ref 3.5–5.1)
SODIUM: 130 mmol/L — AB (ref 135–145)
Total Bilirubin: 1 mg/dL (ref 0.3–1.2)
Total Protein: 6.8 g/dL (ref 6.5–8.1)

## 2015-06-05 LAB — URINE MICROSCOPIC-ADD ON
Bacteria, UA: NONE SEEN
WBC, UA: NONE SEEN WBC/hpf (ref 0–5)

## 2015-06-05 MED ORDER — SODIUM CHLORIDE 0.9 % IV SOLN
Freq: Once | INTRAVENOUS | Status: AC
  Administered 2015-06-05: 08:00:00 via INTRAVENOUS

## 2015-06-05 MED ORDER — ONDANSETRON HCL 4 MG/2ML IJ SOLN
4.0000 mg | Freq: Once | INTRAMUSCULAR | Status: AC
  Administered 2015-06-05: 4 mg via INTRAVENOUS
  Filled 2015-06-05: qty 2

## 2015-06-05 MED ORDER — DIATRIZOATE MEGLUMINE & SODIUM 66-10 % PO SOLN
ORAL | Status: AC
  Administered 2015-06-05: 15 mL
  Filled 2015-06-05: qty 30

## 2015-06-05 MED ORDER — IOPAMIDOL (ISOVUE-300) INJECTION 61%
100.0000 mL | Freq: Once | INTRAVENOUS | Status: AC | PRN
  Administered 2015-06-05: 100 mL via INTRAVENOUS

## 2015-06-05 MED ORDER — SODIUM CHLORIDE 0.9 % IV BOLUS (SEPSIS)
1000.0000 mL | Freq: Once | INTRAVENOUS | Status: AC
  Administered 2015-06-05: 1000 mL via INTRAVENOUS

## 2015-06-05 MED ORDER — VANCOMYCIN HCL 125 MG PO CAPS: 125.0000 mg | ORAL_CAPSULE | Freq: Four times a day (QID) | ORAL | Status: DC

## 2015-06-05 NOTE — ED Notes (Signed)
Pt placed on contact precautions.

## 2015-06-05 NOTE — ED Notes (Signed)
Pt made aware to return if symptoms worsen or if any life threatening symptoms occur.   

## 2015-06-05 NOTE — ED Provider Notes (Signed)
CSN: [id number]     Arrival date & time 06/05/15  [id number] History  By signing my name below, I, Chad [name], attest that this documentation has been prepared under the direction and in the presence of Donnetta HutchingBrian Gracelynn Bircher, MD. Electronically Signed: Placido SouLogan [name], ED Scribe. 06/05/2015. 8:26 AM.   Chief Complaint  Patient presents with  . Diarrhea   The history is provided by the patient. No language interpreter was used.    HPI Comments: Chad [name] is a 82 y.o. male who presents to the Emergency Department complaining of constant, moderate, diarrhea x 2 weeks. Pt reports multiple episodes of diarrhea per day which he describes as watery/brown. He has been evaluated by his PCP who is concerned of possible c-diff and recommended he begin use of probiotics which pt did without relief. He denies any modifying factors. Pt denies rectal bleeding, bloody stools, mucous in stools, changes in appetite and fatigue.    Past Medical History  Diagnosis Date  . Diabetes mellitus without complication (HCC)   . Stroke (HCC)   . Hyperlipidemia   . Hypertension   . Gout   . 1St degree AV block   . Neuropathy [facility](HCC)    Past Surgical History  Procedure Laterality Date  . Carpel tunnel Bilateral   . Kidney stone surgery    . Coronary artery bypass graft      heart- x 6  . Parathyroidectomy     Family History  Problem Relation Age of Onset  . Cancer Father     throat  . CAD Brother 3138    MI questionable, sudden death  . CAD Daughter 3933    MI, stent   Social History  Substance Use Topics  . Smoking status: Former Smoker    Types: Cigarettes    Quit date: 06/12/1977  . Smokeless tobacco: None  . Alcohol Use: No    Review of Systems  Constitutional: Negative for appetite change and fatigue.  Gastrointestinal: Positive for diarrhea. Negative for constipation, blood in stool and anal bleeding.  All other systems reviewed and are negative.   Allergies  Review of patient's allergies  indicates no known allergies.  Home Medications   Prior to Admission medications   Medication Sig Start Date End Date Taking? Authorizing Provider  allopurinol (ZYLOPRIM) 100 MG tablet Take 100 mg by mouth daily.   Yes Historical Provider, MD  atenolol (TENORMIN) 50 MG tablet TAKE ONE TABLET BY MOUTH ONCE DAILY 03/21/15  Yes Ernestina Pennaonald W Moore, MD  atorvastatin (LIPITOR) 20 MG tablet Take 1 tablet (20 mg total) by mouth daily at 6 PM. 02/24/15  Yes Ernestina Pennaonald W Moore, MD  Cholecalciferol (VITAMIN D3) 2000 units TABS Take 2,000 Units by mouth daily.   Yes Historical Provider, MD  cholestyramine light (PREVALITE) 4 g packet Take 1 packet (4 g total) by mouth 2 (two) times daily. 06/02/15  Yes Frederica KusterStephen M Miller, MD  fish oil-omega-3 fatty acids 1000 MG capsule Take 1 g by mouth daily.   Yes Historical Provider, MD  furosemide (LASIX) 40 MG tablet TAKE ONE TABLET BY MOUTH ONCE DAILY 03/21/15  Yes Ernestina Pennaonald W Moore, MD  LANTUS SOLOSTAR 100 UNIT/ML Solostar Pen INJECT 40 UNITS INTO THE SKIN AT BEDTIME 04/14/15  Yes Ernestina Pennaonald W Moore, MD  lisinopril (PRINIVIL,ZESTRIL) 40 MG tablet TAKE ONE TABLET BY MOUTH EVERY DAY 03/15/14  Yes Ernestina Pennaonald W Moore, MD  LORazepam (ATIVAN) 2 MG tablet TAKE ONE TABLET BY MOUTH THREE TIMES DAILY AS NEEDED Patient taking differently:  TAKE ONE TABLET BY MOUTH THREE TIMES DAILY AS NEEDED ANXIETY 04/06/15  Yes Ernestina Penna, MD  Probiotic Product (PROBIOTIC DAILY PO) Take 1 capsule by mouth daily.   Yes Historical Provider, MD  ONE TOUCH ULTRA TEST test strip USE ONE STRIP TO CHECK GLUCOSE TWICE DAILY TO THREE TIMES DAILY 03/10/15   Ernestina Penna, MD  vancomycin (VANCOCIN) 125 MG capsule Take 1 capsule (125 mg total) by mouth 4 (four) times daily. 06/05/15   Donnetta Hutching, MD   BP 116/65 mmHg  Pulse 53  Temp(Src) 98.2 F (36.8 C) (Oral)  Resp 18  Ht  (1.727 m)  Wt 150 lb (68.04 kg)  BMI 22.81 kg/m2  SpO2 95%    Physical Exam  Constitutional: He is oriented to person, place, and time. He  appears well-developed and well-nourished.  Looks well with nml color; alert  HENT:  Head: Normocephalic and atraumatic.  Eyes: Conjunctivae and EOM are normal. Pupils are equal, round, and reactive to light.  Neck: Normal range of motion. Neck supple.  Cardiovascular: Normal rate and regular rhythm.   Pulmonary/Chest: Effort normal and breath sounds normal.  Abdominal: Soft. Bowel sounds are normal. There is no tenderness.  Musculoskeletal: Normal range of motion.  Neurological: He is alert and oriented to person, place, and time.  Skin: Skin is warm and dry.  Psychiatric: He has a normal mood and affect. His behavior is normal.  Nursing note and vitals reviewed.   ED Course  Procedures  DIAGNOSTIC STUDIES: Oxygen Saturation is 98% on RA, normal by my interpretation.    COORDINATION OF CARE: 8:22 AM Discussed next steps with pt including IVF, labs and c-diff culture. He verbalized understanding and is agreeable with the plan.   Labs Review Labs Reviewed  C DIFFICILE QUICK SCREEN W PCR REFLEX - Abnormal; Notable for the following:    C Diff antigen POSITIVE (*)    All other components within normal limits  CBC WITH DIFFERENTIAL/PLATELET - Abnormal; Notable for the following:    WBC 20.4 (*)    RBC 4.10 (*)    Hemoglobin 12.9 (*)    HCT 38.1 (*)    Neutro Abs 16.8 (*)    Monocytes Absolute 1.1 (*)    All other components within normal limits  COMPREHENSIVE METABOLIC PANEL - Abnormal; Notable for the following:    Sodium 130 (*)    Potassium 3.2 (*)    Chloride 99 (*)    Glucose, Bld 116 (*)    BUN 34 (*)    Calcium 8.4 (*)    Albumin 2.8 (*)    ALT 14 (*)    GFR calc non Af Amer 54 (*)    All other components within normal limits  URINALYSIS, ROUTINE W REFLEX MICROSCOPIC (NOT AT [facility]) - Abnormal; Notable for the following:    Specific Gravity, Urine <1.005 (*)    Hgb urine dipstick TRACE (*)    Ketones, ur TRACE (*)    All other components within normal limits   URINE MICROSCOPIC-ADD ON - Abnormal; Notable for the following:    Squamous Epithelial / LPF 0-5 (*)    All other components within normal limits    Imaging Review Ct Abdomen Pelvis W Contrast  06/05/2015  CLINICAL DATA:  Moderate diarrhea 2 weeks. EXAM: CT ABDOMEN AND PELVIS WITH CONTRAST TECHNIQUE: Multidetector CT imaging of the abdomen and pelvis was performed using the standard protocol following bolus administration of intravenous contrast. CONTRAST:  ISOVUE-300 IOPAMIDOL (  ISOVUE-300) INJECTION 61% COMPARISON:  None. FINDINGS: Lung bases demonstrate mild dependent bibasilar atelectasis. Sternotomy wires are present. Calcification of the visualized coronary arteries. Abdominal images demonstrate moderate cholelithiasis. The liver, spleen, pancreas and adrenal glands are within normal. Kidneys are normal in size with a sub cm cortical hypodensity over the lower pole right kidney too small to characterize but likely a cyst. Ureters are within normal. There is calcified plaque over the abdominal aorta and iliac arteries. Stomach is within normal. Appendix is normal. Small bowel is within normal. There is minimal wall thickening and hazy attenuation of the adjacent pericolonic fat involving transverse, descending rectosigmoid colon suggesting a mild acute colitis. Pelvic images demonstrate the bladder and prostate to be within normal. No significant free pelvic fluid. Degenerative changes of the spine and hips. IMPRESSION: Mild wall thickening with hazy attenuation of the adjacent pericolonic fat from the transverse colon distally to the rectum likely mild acute colitis. Cholelithiasis. Sub cm right renal cyst. Electronically Signed   By: Elberta Fortis M.D.   On: 06/05/2015 13:17   I have personally reviewed and evaluated these images and lab results as part of my medical decision-making.   EKG Interpretation None      MDM   Final diagnoses:  Clostridium difficile diarrhea    Patient  is hemodynamically stable. Will Rx vancomycin for C. difficile colitis. Discussed findings with patient and his daughter.   I personally performed the services described in this documentation, which was scribed in my presence. The recorded information has been reviewed and is accurate.      Donnetta Hutching, MD 06/05/15 1409

## 2015-06-05 NOTE — ED Notes (Signed)
Pt comes in for diarrhea since wednesday. Pt went to PCP on Thursday for this and was told to take OTC antidiarrheal. Pt has been taking this with no relief. Pt denies any vomiting.

## 2015-06-05 NOTE — Discharge Instructions (Signed)
Clostridium Difficile Infection Clostridium difficile (C. difficile or C. diff) is a germ found in the intestines. C. difficile infection can happen after taking antibiotic medicines. Antiobiotics may cause the C. difficile to grow out of control and make a toxin that causes the infection. C. difficile infection can cause watery poop (diarrhea) or severe disease. HOME CARE  Drink enough fluids to keep your pee (urine) clear or pale yellow. Avoid milk, caffeine, and alcohol.  Ask your doctor how to replace the body fluids you lost (rehydrate).  Eat small meals often. Avoid eating large meals.  Take your antibiotics as told. Finish them even if you start to feel better.  Do not  take medicines that help watery poop. These medicines may stop you from healing as fast or cause problems.  Wash your hands after using the bathroom and before touching food. Make sure people who live with you wash their hands often.  Clean all surfaces in your home. Use a product that has chlorine bleach in it. GET HELP IF:  Your watery poop lasts longer than expected.  Your watery poop comes back after you finish your antibiotic medicine.  You feel very dry or thirsty (dehydrated).  You have a fever. GET HELP RIGHT AWAY IF:   You have more stomach pain or tenderness.  You have blood in your poop (stool). Your poop may look black and tar-like.  You cannot eat or drink without throwing up (vomiting).   This information is not intended to replace advice given to you by your health care provider. Make sure you discuss any questions you have with your health care provider.   Document Released: 11/05/2008 Document Revised: 01/29/2014 Document Reviewed: 07/12/2014 Elsevier Interactive Patient Education 2016 [employer]   Increase fluids. Antibiotic for Clostridium difficile. Good handwashing. Follow-up your primary care doctor.

## 2015-06-05 NOTE — ED Notes (Signed)
Pt taken to CT.

## 2015-06-05 NOTE — ED Notes (Signed)
Pt returned from CT °

## 2015-06-05 NOTE — ED Notes (Signed)
Dr Cook at bedside

## 2015-06-05 NOTE — ED Notes (Signed)
Pt assisted to bathroom to have bowel movement.

## 2015-06-08 NOTE — Progress Notes (Signed)
Patient results are confusing he has C. difficile present but not the toxin and treatment is not recommended given this result

## 2015-06-10 DIAGNOSIS — H40033 Anatomical narrow angle, bilateral: Secondary | ICD-10-CM | POA: Diagnosis not present

## 2015-06-10 DIAGNOSIS — E119 Type 2 diabetes mellitus without complications: Secondary | ICD-10-CM | POA: Diagnosis not present

## 2015-06-21 ENCOUNTER — Other Ambulatory Visit: Payer: Self-pay | Admitting: Family Medicine

## 2015-06-22 ENCOUNTER — Ambulatory Visit (INDEPENDENT_AMBULATORY_CARE_PROVIDER_SITE_OTHER): Payer: Medicare Other | Admitting: Family Medicine

## 2015-06-22 ENCOUNTER — Encounter: Payer: Self-pay | Admitting: Family Medicine

## 2015-06-22 VITALS — BP 116/61 | HR 64 | Temp 97.5°F | Ht 67.0 in | Wt 157.8 lb

## 2015-06-22 DIAGNOSIS — R197 Diarrhea, unspecified: Secondary | ICD-10-CM | POA: Diagnosis not present

## 2015-06-22 LAB — CMP14+EGFR
ALBUMIN: 3 g/dL — AB (ref 3.5–4.7)
ALT: 24 IU/L (ref 0–44)
AST: 36 IU/L (ref 0–40)
Albumin/Globulin Ratio: 1.3 (ref 1.2–2.2)
Alkaline Phosphatase: 80 IU/L (ref 39–117)
BUN / CREAT RATIO: 19 (ref 10–24)
BUN: 24 mg/dL (ref 8–27)
Bilirubin Total: 0.9 mg/dL (ref 0.0–1.2)
CO2: 23 mmol/L (ref 18–29)
CREATININE: 1.29 mg/dL — AB (ref 0.76–1.27)
Calcium: 7.9 mg/dL — ABNORMAL LOW (ref 8.6–10.2)
Chloride: 96 mmol/L (ref 96–106)
GFR calc non Af Amer: 51 mL/min/{1.73_m2} — ABNORMAL LOW (ref >59)
GFR, EST AFRICAN AMERICAN: 59 mL/min/{1.73_m2} — AB (ref >59)
GLUCOSE: 69 mg/dL (ref 65–99)
Globulin, Total: 2.4 g/dL (ref 1.5–4.5)
Potassium: 3.8 mmol/L (ref 3.5–5.2)
Sodium: 135 mmol/L (ref 134–144)
TOTAL PROTEIN: 5.4 g/dL — AB (ref 6.0–8.5)

## 2015-06-22 LAB — CBC WITH DIFFERENTIAL/PLATELET
BASOS ABS: 0 10*3/uL (ref 0.0–0.2)
Basos: 0 %
EOS (ABSOLUTE): 0 10*3/uL (ref 0.0–0.4)
Eos: 0 %
HEMOGLOBIN: 10.8 g/dL — AB (ref 12.6–17.7)
Hematocrit: 33.4 % — ABNORMAL LOW (ref 37.5–51.0)
IMMATURE GRANS (ABS): 0 10*3/uL (ref 0.0–0.1)
IMMATURE GRANULOCYTES: 0 %
LYMPHS: 13 %
Lymphocytes Absolute: 1 10*3/uL (ref 0.7–3.1)
MCH: 30.5 pg (ref 26.6–33.0)
MCHC: 32.3 g/dL (ref 31.5–35.7)
MCV: 94 fL (ref 79–97)
MONOCYTES: 7 %
Monocytes Absolute: 0.5 10*3/uL (ref 0.1–0.9)
NEUTROS PCT: 80 %
Neutrophils Absolute: 6 10*3/uL (ref 1.4–7.0)
PLATELETS: 148 10*3/uL — AB (ref 150–379)
RBC: 3.54 x10E6/uL — AB (ref 4.14–5.80)
RDW: 17.9 % — ABNORMAL HIGH (ref 12.3–15.4)
WBC: 7.6 10*3/uL (ref 3.4–10.8)

## 2015-06-22 MED ORDER — VANCOMYCIN HCL 250 MG PO CAPS: 250.0000 mg | ORAL_CAPSULE | Freq: Four times a day (QID) | ORAL | Status: DC

## 2015-06-22 NOTE — Progress Notes (Signed)
Subjective:    Patient ID: Chad [name], male    DOB: 1931/12/31, 82 y.o.   MRN: [id number]  Diarrhea  The current episode started more than 1 month ago. The problem has been unchanged. Pertinent negatives include no abdominal pain, arthralgias, chills, coughing, fever, headaches or vomiting.   Patient here today for diarrhea that started about 6 weeks ago. He is accompanied today by his daughter. Pt. Had positive antibody but negative antigen for C. Diff.Took vanc for 10 days. Did well. Sx recurred yesterday. Appetite decreased.Now having watery stool 7-8 a day. Having encopresis as well. There is been no blood in the diarrhea and no abdominal pain. He did take Levaquin prior to the onset of first episode diarrhea. He has used Imodium for relief of symptoms but had little improvement.     Patient Active Problem List   Diagnosis Date Noted  . Hyperuricemia 02/23/2015  . Thoracic aorta atherosclerosis ([place]) 02/23/2015  . Senile purpura ([place]) 02/23/2015  . Thrombocytopenia ([place]) 05/04/2014  . Long term (current) use of anticoagulants 08/20/2013  . Atrial fibrillation, unspecified 08/19/2013  . BPH (benign prostatic hyperplasia) 04/13/2013  . ASCVD (arteriosclerotic cardiovascular disease) 11/13/2012  . Obstructive sleep apnea 11/13/2012  . HTN (hypertension) 06/12/2012  . Hyperlipidemia LDL goal <70 06/12/2012  . Diabetes mellitus, insulin dependent (IDDM), uncontrolled ([place]) 06/12/2012   Outpatient Encounter Prescriptions as of 06/22/2015  Medication Sig  . allopurinol (ZYLOPRIM) 100 MG tablet Take 100 mg by mouth daily.  Marland Kitchen atenolol (TENORMIN) 50 MG tablet TAKE ONE TABLET BY MOUTH ONCE DAILY  . atorvastatin (LIPITOR) 20 MG tablet Take 1 tablet (20 mg total) by mouth daily at 6 PM.  . Cholecalciferol (VITAMIN D3) 2000 units TABS Take 2,000 Units by mouth daily.  . fish oil-omega-3 fatty acids 1000 MG capsule Take 1 g by mouth daily.  . furosemide (LASIX) 40 MG tablet TAKE ONE  TABLET BY MOUTH ONCE DAILY  . LANTUS SOLOSTAR 100 UNIT/ML Solostar Pen INJECT 40 UNITS INTO THE SKIN AT BEDTIME  . lisinopril (PRINIVIL,ZESTRIL) 40 MG tablet TAKE ONE TABLET BY MOUTH ONCE DAILY  . LORazepam (ATIVAN) 2 MG tablet TAKE ONE TABLET BY MOUTH THREE TIMES DAILY AS NEEDED (Patient taking differently: TAKE ONE TABLET BY MOUTH THREE TIMES DAILY AS NEEDED ANXIETY)  . ONE TOUCH ULTRA TEST test strip USE ONE STRIP TO CHECK GLUCOSE TWICE DAILY TO THREE TIMES DAILY  . Probiotic Product (PROBIOTIC DAILY PO) Take 1 capsule by mouth daily.  . cholestyramine light (PREVALITE) 4 g packet Take 1 packet (4 g total) by mouth 2 (two) times daily. (Patient not taking: Reported on 06/22/2015)  . vancomycin (VANCOCIN HCL) 250 MG capsule Take 1 capsule (250 mg total) by mouth 4 (four) times daily.  . [DISCONTINUED] vancomycin (VANCOCIN) 125 MG capsule Take 1 capsule (125 mg total) by mouth 4 (four) times daily. (Patient not taking: Reported on 06/22/2015)   No facility-administered encounter medications on file as of 06/22/2015.      Review of Systems  Constitutional: Negative for fever, chills, diaphoresis and unexpected weight change.  HENT: Negative.  Negative for rhinorrhea and trouble swallowing.   Eyes: Negative.   Respiratory: Negative.  Negative for cough, chest tightness and shortness of breath.   Cardiovascular: Negative.  Negative for chest pain.  Gastrointestinal: Positive for diarrhea and abdominal distention. Negative for nausea, vomiting, abdominal pain, constipation, blood in stool and rectal pain.  Endocrine: Negative.   Genitourinary: Negative.  Negative for dysuria, hematuria and flank  pain.  Musculoskeletal: Negative.  Negative for joint swelling and arthralgias.  Skin: Negative.  Negative for rash.  Allergic/Immunologic: Negative.   Neurological: Positive for weakness. Negative for syncope and headaches.  Hematological: Negative.   Psychiatric/Behavioral: Negative.          Objective:   Physical Exam  Constitutional: He is oriented to person, place, and time. He appears well-developed and well-nourished. No distress.  HENT:  Head: Normocephalic and atraumatic.  Right Ear: External ear normal.  Left Ear: External ear normal.  Nose: Nose normal.  Mouth/Throat: Oropharynx is clear and moist.  Eyes: Conjunctivae and EOM are normal. Pupils are equal, round, and reactive to light.  Neck: Normal range of motion. Neck supple. No thyromegaly present.  Cardiovascular: Normal rate, regular rhythm and normal heart sounds.   No murmur heard. Pulmonary/Chest: Effort normal and breath sounds normal. No respiratory distress. He has no wheezes. He has no rales.  Abdominal: Soft. Bowel sounds are normal. He exhibits distension. There is no tenderness.  Lymphadenopathy:    He has no cervical adenopathy.  Neurological: He is alert and oriented to person, place, and time. He has normal reflexes.  Skin: Skin is warm and dry.  Psychiatric: He has a normal mood and affect. His behavior is normal. Judgment and thought content normal.   BP 116/61 mmHg  Pulse 64  Temp(Src) 97.5 F (36.4 C) (Oral)  Ht _0  (1.702 m)  Wt 157 lb 12.8 oz (71.578 kg)  BMI 24.71 kg/m2  SpO2 96%        Assessment & Plan:  1. Diarrhea, unspecified type A. Likely recurrence of C. Diff, But with history of negative antigen this bears further investigation. Therefore stool for C. difficile as well as stool culture and O&P will be obtained. He should not use the Imodium for now. He does need to keep himself well-hydrated. We discussed drinking copious amounts of water. Additionally he will be referred to gastroenterology for further workup. His dose of vancomycin will be increased both in strength and duration of use. Consider adding metronidazole as needed.    Medication List       This list is accurate as of: 06/22/15  1:18 PM.  Always use your most recent med list.                allopurinol 100 MG tablet  Commonly known as:  ZYLOPRIM  Take 100 mg by mouth daily.     atenolol 50 MG tablet  Commonly known as:  TENORMIN  TAKE ONE TABLET BY MOUTH ONCE DAILY     atorvastatin 20 MG tablet  Commonly known as:  LIPITOR  Take 1 tablet (20 mg total) by mouth daily at 6 PM.     cholestyramine light 4 g packet  Commonly known as:  PREVALITE  Take 1 packet (4 g total) by mouth 2 (two) times daily.     fish oil-omega-3 fatty acids 1000 MG capsule  Take 1 g by mouth daily.     furosemide 40 MG tablet  Commonly known as:  LASIX  TAKE ONE TABLET BY MOUTH ONCE DAILY     LANTUS SOLOSTAR 100 UNIT/ML Solostar Pen  Generic drug:  Insulin Glargine  INJECT 40 UNITS INTO THE SKIN AT BEDTIME     lisinopril 40 MG tablet  Commonly known as:  PRINIVIL,ZESTRIL  TAKE ONE TABLET BY MOUTH ONCE DAILY     LORazepam 2 MG tablet  Commonly known as:  ATIVAN  TAKE ONE TABLET  BY MOUTH THREE TIMES DAILY AS NEEDED     ONE TOUCH ULTRA TEST test strip  Generic drug:  glucose blood  USE ONE STRIP TO CHECK GLUCOSE TWICE DAILY TO THREE TIMES DAILY     PROBIOTIC DAILY PO  Take 1 capsule by mouth daily.     vancomycin 250 MG capsule  Commonly known as:  VANCOCIN HCL  Take 1 capsule (250 mg total) by mouth 4 (four) times daily.     Vitamin D3 2000 units Tabs  Take 2,000 Units by mouth daily.       1. Diarrhea, unspecified type     Meds ordered this encounter  Medications  . vancomycin (VANCOCIN HCL) 250 MG capsule    Sig: Take 1 capsule (250 mg total) by mouth 4 (four) times daily.    Dispense:  56 capsule    Refill:  0    Orders Placed This Encounter  Procedures  . Clostridium difficile EIA  . Cdiff NAA+O+P+Stool Culture  . CBC with Differential/Platelet  . CMP14+EGFR    Order Specific Question:  Has the patient fasted?    Answer:  Yes  . Ambulatory referral to Gastroenterology    Referral Priority:  Routine    Referral Type:  Consultation    Referral Reason:   Specialty Services Required    Number of Visits Requested:  1    Labs pending Health Maintenance reviewed Diet and exercise encouraged Continue all meds as discussed Follow up in 1week  Claretta Fraise, MD

## 2015-06-26 LAB — IRON AND TIBC
Iron Saturation: 10 % — ABNORMAL LOW (ref 15–55)
Iron: 17 ug/dL — ABNORMAL LOW (ref 38–169)
TIBC: 177 ug/dL — AB (ref 250–450)
UIBC: 160 ug/dL (ref 111–343)

## 2015-06-26 LAB — FERRITIN: Ferritin: 467 ng/mL — ABNORMAL HIGH (ref 30–400)

## 2015-06-26 LAB — SPECIMEN STATUS REPORT

## 2015-06-29 ENCOUNTER — Encounter: Payer: Self-pay | Admitting: Family Medicine

## 2015-06-29 ENCOUNTER — Ambulatory Visit (INDEPENDENT_AMBULATORY_CARE_PROVIDER_SITE_OTHER): Payer: Medicare Other | Admitting: Family Medicine

## 2015-06-29 VITALS — BP 140/62 | HR 62 | Temp 96.9°F | Ht 67.0 in | Wt 153.0 lb

## 2015-06-29 DIAGNOSIS — I4891 Unspecified atrial fibrillation: Secondary | ICD-10-CM | POA: Diagnosis not present

## 2015-06-29 DIAGNOSIS — E108 Type 1 diabetes mellitus with unspecified complications: Secondary | ICD-10-CM

## 2015-06-29 DIAGNOSIS — D696 Thrombocytopenia, unspecified: Secondary | ICD-10-CM

## 2015-06-29 DIAGNOSIS — I481 Persistent atrial fibrillation: Secondary | ICD-10-CM | POA: Diagnosis not present

## 2015-06-29 DIAGNOSIS — E1065 Type 1 diabetes mellitus with hyperglycemia: Secondary | ICD-10-CM | POA: Diagnosis not present

## 2015-06-29 DIAGNOSIS — E559 Vitamin D deficiency, unspecified: Secondary | ICD-10-CM

## 2015-06-29 DIAGNOSIS — I10 Essential (primary) hypertension: Secondary | ICD-10-CM

## 2015-06-29 DIAGNOSIS — E785 Hyperlipidemia, unspecified: Secondary | ICD-10-CM

## 2015-06-29 DIAGNOSIS — R5383 Other fatigue: Secondary | ICD-10-CM

## 2015-06-29 DIAGNOSIS — D692 Other nonthrombocytopenic purpura: Secondary | ICD-10-CM

## 2015-06-29 DIAGNOSIS — I499 Cardiac arrhythmia, unspecified: Secondary | ICD-10-CM

## 2015-06-29 DIAGNOSIS — R197 Diarrhea, unspecified: Secondary | ICD-10-CM | POA: Diagnosis not present

## 2015-06-29 DIAGNOSIS — I4819 Other persistent atrial fibrillation: Secondary | ICD-10-CM

## 2015-06-29 DIAGNOSIS — N400 Enlarged prostate without lower urinary tract symptoms: Secondary | ICD-10-CM

## 2015-06-29 DIAGNOSIS — IMO0002 Reserved for concepts with insufficient information to code with codable children: Secondary | ICD-10-CM

## 2015-06-29 DIAGNOSIS — I2510 Atherosclerotic heart disease of native coronary artery without angina pectoris: Secondary | ICD-10-CM | POA: Diagnosis not present

## 2015-06-29 LAB — BAYER DCA HB A1C WAIVED: HB A1C: 6.3 % (ref <7.0)

## 2015-06-29 NOTE — Patient Instructions (Addendum)
Medicare Annual Wellness Visit  [facility] and the medical providers at [facility] strive to bring you the best medical care.  In doing so we not only want to address your current medical conditions and concerns but also to detect new conditions early and prevent illness, disease and health-related problems.    Medicare offers a yearly Wellness Visit which allows our clinical staff to assess your need for preventative services including immunizations, lifestyle education, counseling to decrease risk of preventable diseases and screening for fall risk and other medical concerns.    This visit is provided free of charge (no copay) for all Medicare recipients. The clinical pharmacists at [facility] have begun to conduct these Wellness Visits which will also include a thorough review of all your medications.    As you primary medical provider recommend that you make an appointment for your Annual Wellness Visit if you have not done so already this year.  You may set up this appointment before you leave today or you may call [phone] and schedule an appointment.  Please make sure when you call that you mention that you are scheduling your Annual Wellness Visit with the clinical pharmacist so that the appointment may be made for the proper length of time.     Continue current medications. Continue good therapeutic lifestyle changes which include good diet and exercise. Fall precautions discussed with patient. If an FOBT was given today- please return it to our front desk. If you are over 80 years old - you may need Prevnar 13 or the adult Pneumonia vaccine.  **Flu shots are available--- please call and schedule a FLU-CLINIC appointment**  After your visit with us today you will receive a survey in the mail or online from [employer] regarding your care with us. Please take a moment to fill this out. Your feedback is very  important to us as you can help us better understand your patient needs as well as improve your experience and satisfaction. WE CARE ABOUT YOU!!!   The patient should finish the vancomycin He should try to eat and drink healthily He should avoid anything with caffeine and this includes diet Mountain Dew  7-Up and ginger ale would be okay for intake Wear support hose daily and put these on the first thing upon arising in the morning Elevate feet during the day as much as possible  Pick up some compression / support hose and wear these daily.

## 2015-06-29 NOTE — Progress Notes (Signed)
Subjective:    Patient ID: Chad [name], male    DOB: 30-Mar-1931, 82 y.o.   MRN: [id number]  HPI Pt here for follow up and management of chronic medical problems which includes diabetes, hyperlipidemia and hypertension. He is taking medications regularly.She is has some swelling in his leg has complaining with loose stools for several weeks now. His weight was 158 and is now down to 153. The patient's physical and emotional condition has changed tremendously since the last time he was seen by me. His wife is in the hospital with terminal breast cancer. He was at home by himself but does have children check on him. He prepares his own food. He has been drinking [employer] that are diet but not caffeine free. He denies any chest pain or shortness of breath. He does not have any trouble sleeping at night. He's had increasing edema over the past 10 days to 2 weeks. He says his stools are becoming more normal and more formed and he is only having 1 stool a day since taking the second round of vancomycin. He has not completed the full course of this yet. He is very weak. He's passing his water without problems. He has not seen any blood in the stool or had any black tarry bowel movements.     Patient Active Problem List   Diagnosis Date Noted  . Hyperuricemia 02/23/2015  . Thoracic aorta atherosclerosis ([place]) 02/23/2015  . Senile purpura ([place]) 02/23/2015  . Thrombocytopenia ([place]) 05/04/2014  . Long term (current) use of anticoagulants 08/20/2013  . Atrial fibrillation, unspecified 08/19/2013  . BPH (benign prostatic hyperplasia) 04/13/2013  . ASCVD (arteriosclerotic cardiovascular disease) 11/13/2012  . Obstructive sleep apnea 11/13/2012  . HTN (hypertension) 06/12/2012  . Hyperlipidemia LDL goal <70 06/12/2012  . Diabetes mellitus, insulin dependent (IDDM), uncontrolled ([place]) 06/12/2012   Outpatient Encounter Prescriptions as of 06/29/2015  Medication Sig  . allopurinol (ZYLOPRIM) 100 MG  tablet Take 100 mg by mouth daily.  Marland Kitchen atenolol (TENORMIN) 50 MG tablet TAKE ONE TABLET BY MOUTH ONCE DAILY  . atorvastatin (LIPITOR) 20 MG tablet Take 1 tablet (20 mg total) by mouth daily at 6 PM.  . Cholecalciferol (VITAMIN D3) 2000 units TABS Take 2,000 Units by mouth daily.  . cholestyramine light (PREVALITE) 4 g packet Take 1 packet (4 g total) by mouth 2 (two) times daily.  . fish oil-omega-3 fatty acids 1000 MG capsule Take 1 g by mouth daily.  . furosemide (LASIX) 40 MG tablet TAKE ONE TABLET BY MOUTH ONCE DAILY  . LANTUS SOLOSTAR 100 UNIT/ML Solostar Pen INJECT 40 UNITS INTO THE SKIN AT BEDTIME  . lisinopril (PRINIVIL,ZESTRIL) 40 MG tablet TAKE ONE TABLET BY MOUTH ONCE DAILY  . LORazepam (ATIVAN) 2 MG tablet TAKE ONE TABLET BY MOUTH THREE TIMES DAILY AS NEEDED (Patient taking differently: TAKE ONE TABLET BY MOUTH THREE TIMES DAILY AS NEEDED ANXIETY)  . ONE TOUCH ULTRA TEST test strip USE ONE STRIP TO CHECK GLUCOSE TWICE DAILY TO THREE TIMES DAILY  . Probiotic Product (PROBIOTIC DAILY PO) Take 1 capsule by mouth daily.  . vancomycin (VANCOCIN HCL) 250 MG capsule Take 1 capsule (250 mg total) by mouth 4 (four) times daily.   No facility-administered encounter medications on file as of 06/29/2015.      Review of Systems  Constitutional: Negative.   HENT: Negative.   Eyes: Negative.   Respiratory: Negative.   Cardiovascular: Positive for leg swelling.  Gastrointestinal: Positive for diarrhea (x 7 weeks  or so - ? c-diff).  Endocrine: Negative.   Genitourinary: Negative.   Musculoskeletal: Negative.   Skin: Negative.   Allergic/Immunologic: Negative.   Neurological: Negative.   Hematological: Negative.   Psychiatric/Behavioral: Negative.        Objective:   Physical Exam  Constitutional: He is oriented to person, place, and time. No distress.  Elderly and frail appearing  HENT:  Head: Normocephalic and atraumatic.  Right Ear: External ear normal.  Left Ear: External ear  normal.  Nose: Nose normal.  Mouth/Throat: Oropharynx is clear and moist. No oropharyngeal exudate.  Eyes: Conjunctivae and EOM are normal. Pupils are equal, round, and reactive to light. Right eye exhibits no discharge. Left eye exhibits no discharge. No scleral icterus.  Neck: Normal range of motion. Neck supple. No thyromegaly present.  Cardiovascular: Normal heart sounds and intact distal pulses.   No murmur heard. The heart is irregular irregular at about 48/m.  Pulmonary/Chest: Effort normal and breath sounds normal. No respiratory distress. He has no wheezes. He has no rales. He exhibits no tenderness.  Clear anteriorly and posteriorly  Abdominal: Soft. Bowel sounds are normal. He exhibits no mass. There is no tenderness. There is no rebound and no guarding.  No abdominal tenderness liver or spleen enlargement or masses or bruits  Musculoskeletal: Normal range of motion. He exhibits edema. He exhibits no tenderness.  One plus pretibial edema  Lymphadenopathy:    He has no cervical adenopathy.  Neurological: He is alert and oriented to person, place, and time.  Skin: Skin is warm and dry. No rash noted.  Psychiatric: He has a normal mood and affect. His behavior is normal. Judgment and thought content normal.  The patient's mood is somewhat flat and depressed  Nursing note and vitals reviewed.  BP 140/62 mmHg  Pulse 62  Temp(Src) 96.9 F (36.1 C) (Oral)  Ht _0  (1.702 m)  Wt 153 lb (69.4 kg)  BMI 23.96 kg/m2  EKG: Atrial fibrillation with anterolateral ischemia and old anterior infarct.       Assessment & Plan:  1. ASCVD (arteriosclerotic cardiovascular disease) -The patient has atrial fibrillation and has periodic visits to see the cardiologist. We will check and see when the next visit is planned. - CBC with Differential/Platelet  2. Persistent atrial fibrillation (HCC) -The rate appears slower today than usual at about 48/m. - CBC with Differential/Platelet  3.  BPH (benign prostatic hyperplasia) - CBC with Differential/Platelet  4. Uncontrolled type 1 diabetes mellitus with complication (HCC) - Bayer DCA Hb A1c Waived - CBC with Differential/Platelet - Microalbumin / creatinine urine ratio  5. Hyperlipidemia -Continue current treatment pending results of lab work - Lipid panel - CBC with Differential/Platelet  6. Essential hypertension -The blood pressure is good today and he'll be no change in treatment - CBC with Differential/Platelet - BMP8+EGFR - Hepatic function panel  7. Vitamin D deficiency -Continue current treatment pending results of lab work - VITAMIN D 25 Hydroxy (Vit-D Deficiency, Fractures) - CBC with Differential/Platelet  8. Diarrhea, unspecified type -This has abated with the second course of vancomycin which she has not completed. He says his stools are now once daily and formed. - CBC with Differential/Platelet - BMP8+EGFR - Hepatic function panel  9. Senile purpura ([place])  10. Thrombocytopenia ([place])  11. Atrial fibrillation, unspecified -The patient remains in atrial fibrillation. - EKG 12-Lead  12. Hyperlipidemia LDL goal <70 -Continue current treatment pending results of lab work  13. Irregular heart beat - EKG 12-Lead -  Thyroid Panel With TSH  14. Other fatigue - EKG 12-Lead - Thyroid Panel With TSH  Patient Instructions                       Medicare Annual Wellness Visit  [place] and the medical providers at [facility] strive to bring you the best medical care.  In doing so we not only want to address your current medical conditions and concerns but also to detect new conditions early and prevent illness, disease and health-related problems.    Medicare offers a yearly Wellness Visit which allows our clinical staff to assess your need for preventative services including immunizations, lifestyle education, counseling to decrease risk of preventable diseases and  screening for fall risk and other medical concerns.    This visit is provided free of charge (no copay) for all Medicare recipients. The clinical pharmacists at [facility] have begun to conduct these Wellness Visits which will also include a thorough review of all your medications.    As you primary medical provider recommend that you make an appointment for your Annual Wellness Visit if you have not done so already this year.  You may set up this appointment before you leave today or you may call back ([phone]) and schedule an appointment.  Please make sure when you call that you mention that you are scheduling your Annual Wellness Visit with the clinical pharmacist so that the appointment may be made for the proper length of time.     Continue current medications. Continue good therapeutic lifestyle changes which include good diet and exercise. Fall precautions discussed with patient. If an FOBT was given today- please return it to our front desk. If you are over 88 years old - you may need Prevnar 100 or the adult Pneumonia vaccine.  **Flu shots are available--- please call and schedule a FLU-CLINIC appointment**  After your visit with [place] today you will receive a survey in the mail or online from [employer] regarding your care with [place]. Please take a moment to fill this out. Your feedback is very important to [place] as you can help [place] better understand your patient needs as well as improve your experience and satisfaction. WE CARE ABOUT YOU!!!   The patient should finish the vancomycin He should try to eat and drink healthily He should avoid anything with caffeine and this includes diet Mountain Dew  7-Up and ginger ale would be okay for intake Wear support hose daily and put these on the first thing upon arising in the morning Elevate feet during the day as much as possible   Arrie Senate MD

## 2015-06-29 NOTE — Addendum Note (Signed)
Addended by: Magdalene RiverBULLINS, Cyris Maalouf H on: 06/29/2015 12:21 PM   Modules accepted: Orders

## 2015-06-30 LAB — BMP8+EGFR
BUN/Creatinine Ratio: 15 (ref 10–24)
BUN: 13 mg/dL (ref 8–27)
CALCIUM: 8.8 mg/dL (ref 8.6–10.2)
CHLORIDE: 95 mmol/L — AB (ref 96–106)
CO2: 25 mmol/L (ref 18–29)
Creatinine, Ser: 0.87 mg/dL (ref 0.76–1.27)
GFR calc Af Amer: 93 mL/min/{1.73_m2} (ref >59)
GFR, EST NON AFRICAN AMERICAN: 80 mL/min/{1.73_m2} (ref >59)
Glucose: 131 mg/dL — ABNORMAL HIGH (ref 65–99)
POTASSIUM: 3.9 mmol/L (ref 3.5–5.2)
Sodium: 137 mmol/L (ref 134–144)

## 2015-06-30 LAB — CBC WITH DIFFERENTIAL/PLATELET
Basophils Absolute: 0 10*3/uL (ref 0.0–0.2)
Basos: 0 %
EOS (ABSOLUTE): 0.1 10*3/uL (ref 0.0–0.4)
EOS: 1 %
HEMATOCRIT: 38.8 % (ref 37.5–51.0)
HEMOGLOBIN: 12.8 g/dL (ref 12.6–17.7)
IMMATURE GRANULOCYTES: 1 %
Immature Grans (Abs): 0.1 10*3/uL (ref 0.0–0.1)
Lymphocytes Absolute: 2.1 10*3/uL (ref 0.7–3.1)
Lymphs: 30 %
MCH: 30.9 pg (ref 26.6–33.0)
MCHC: 33 g/dL (ref 31.5–35.7)
MCV: 94 fL (ref 79–97)
MONOCYTES: 8 %
MONOS ABS: 0.6 10*3/uL (ref 0.1–0.9)
NEUTROS PCT: 60 %
Neutrophils Absolute: 4.2 10*3/uL (ref 1.4–7.0)
Platelets: 282 10*3/uL (ref 150–379)
RBC: 4.14 x10E6/uL (ref 4.14–5.80)
RDW: 17.6 % — AB (ref 12.3–15.4)
WBC: 7 10*3/uL (ref 3.4–10.8)

## 2015-06-30 LAB — LIPID PANEL
CHOLESTEROL TOTAL: 168 mg/dL (ref 100–199)
Chol/HDL Ratio: 3.8 ratio units (ref 0.0–5.0)
HDL: 44 mg/dL (ref >39)
LDL Calculated: 108 mg/dL — ABNORMAL HIGH (ref 0–99)
Triglycerides: 82 mg/dL (ref 0–149)
VLDL Cholesterol Cal: 16 mg/dL (ref 5–40)

## 2015-06-30 LAB — HEPATIC FUNCTION PANEL
ALBUMIN: 3.4 g/dL — AB (ref 3.5–4.7)
ALT: 13 IU/L (ref 0–44)
AST: 17 IU/L (ref 0–40)
Alkaline Phosphatase: 78 IU/L (ref 39–117)
BILIRUBIN TOTAL: 0.7 mg/dL (ref 0.0–1.2)
Bilirubin, Direct: 0.21 mg/dL (ref 0.00–0.40)
TOTAL PROTEIN: 6.4 g/dL (ref 6.0–8.5)

## 2015-06-30 LAB — VITAMIN D 25 HYDROXY (VIT D DEFICIENCY, FRACTURES): Vit D, 25-Hydroxy: 93.7 ng/mL (ref 30.0–100.0)

## 2015-06-30 LAB — MICROALBUMIN / CREATININE URINE RATIO
Creatinine, Urine: 25.9 mg/dL
MICROALB/CREAT RATIO: 2161 mg/g creat — ABNORMAL HIGH (ref 0.0–30.0)
Microalbumin, Urine: 559.7 ug/mL

## 2015-06-30 LAB — THYROID PANEL WITH TSH
FREE THYROXINE INDEX: 2.6 (ref 1.2–4.9)
T3 UPTAKE RATIO: 30 % (ref 24–39)
T4 TOTAL: 8.7 ug/dL (ref 4.5–12.0)
TSH: 2.15 u[IU]/mL (ref 0.450–4.500)

## 2015-06-30 LAB — BRAIN NATRIURETIC PEPTIDE: BNP: 1261.2 pg/mL — ABNORMAL HIGH (ref 0.0–100.0)

## 2015-07-04 ENCOUNTER — Telehealth: Payer: Self-pay | Admitting: Family Medicine

## 2015-07-04 DIAGNOSIS — I2510 Atherosclerotic heart disease of native coronary artery without angina pectoris: Secondary | ICD-10-CM

## 2015-07-04 DIAGNOSIS — IMO0002 Reserved for concepts with insufficient information to code with codable children: Secondary | ICD-10-CM

## 2015-07-04 DIAGNOSIS — E108 Type 1 diabetes mellitus with unspecified complications: Principal | ICD-10-CM

## 2015-07-04 DIAGNOSIS — E1065 Type 1 diabetes mellitus with hyperglycemia: Secondary | ICD-10-CM

## 2015-07-04 NOTE — Telephone Encounter (Signed)
Patient's daughter aware of results and orders placed.

## 2015-07-05 ENCOUNTER — Telehealth: Payer: Self-pay | Admitting: Family Medicine

## 2015-07-05 NOTE — Telephone Encounter (Signed)
FYI, spoke with daughter, she is concerned because of decline in father's health.  She feels that a lot of this is related to her mother's move to a nursing home and father's anxiety over this.  She wanted to let you know he has only been taking his Ativan at bedtime but she is going to speak with him about also taking one in the morning and another one or half of one in the afternoon if needed.  She said they will see you on Friday.

## 2015-07-06 ENCOUNTER — Emergency Department (HOSPITAL_COMMUNITY): Payer: Medicare Other

## 2015-07-06 ENCOUNTER — Encounter (HOSPITAL_COMMUNITY): Payer: Self-pay | Admitting: Emergency Medicine

## 2015-07-06 ENCOUNTER — Observation Stay (HOSPITAL_COMMUNITY)
Admission: EM | Admit: 2015-07-06 | Discharge: 2015-07-07 | Disposition: A | Discharge status: Home / Self Care | Payer: Medicare Other | Attending: Internal Medicine | Admitting: Internal Medicine

## 2015-07-06 DIAGNOSIS — I2510 Atherosclerotic heart disease of native coronary artery without angina pectoris: Secondary | ICD-10-CM | POA: Diagnosis not present

## 2015-07-06 DIAGNOSIS — J0100 Acute maxillary sinusitis, unspecified: Secondary | ICD-10-CM | POA: Insufficient documentation

## 2015-07-06 DIAGNOSIS — Z7901 Long term (current) use of anticoagulants: Secondary | ICD-10-CM

## 2015-07-06 DIAGNOSIS — I10 Essential (primary) hypertension: Secondary | ICD-10-CM | POA: Insufficient documentation

## 2015-07-06 DIAGNOSIS — E785 Hyperlipidemia, unspecified: Secondary | ICD-10-CM | POA: Diagnosis present

## 2015-07-06 DIAGNOSIS — I700 Atherosclerosis of aorta: Secondary | ICD-10-CM | POA: Insufficient documentation

## 2015-07-06 DIAGNOSIS — Z8249 Family history of ischemic heart disease and other diseases of the circulatory system: Secondary | ICD-10-CM | POA: Diagnosis not present

## 2015-07-06 DIAGNOSIS — R531 Weakness: Secondary | ICD-10-CM | POA: Diagnosis not present

## 2015-07-06 DIAGNOSIS — E1165 Type 2 diabetes mellitus with hyperglycemia: Secondary | ICD-10-CM | POA: Diagnosis not present

## 2015-07-06 DIAGNOSIS — R410 Disorientation, unspecified: Secondary | ICD-10-CM | POA: Diagnosis not present

## 2015-07-06 DIAGNOSIS — Z87891 Personal history of nicotine dependence: Secondary | ICD-10-CM | POA: Diagnosis not present

## 2015-07-06 DIAGNOSIS — J320 Chronic maxillary sinusitis: Secondary | ICD-10-CM | POA: Diagnosis not present

## 2015-07-06 DIAGNOSIS — M109 Gout, unspecified: Secondary | ICD-10-CM | POA: Diagnosis not present

## 2015-07-06 DIAGNOSIS — R55 Syncope and collapse: Secondary | ICD-10-CM | POA: Diagnosis not present

## 2015-07-06 DIAGNOSIS — G4733 Obstructive sleep apnea (adult) (pediatric): Secondary | ICD-10-CM | POA: Diagnosis not present

## 2015-07-06 DIAGNOSIS — Z8673 Personal history of transient ischemic attack (TIA), and cerebral infarction without residual deficits: Secondary | ICD-10-CM | POA: Insufficient documentation

## 2015-07-06 DIAGNOSIS — G9340 Encephalopathy, unspecified: Principal | ICD-10-CM | POA: Insufficient documentation

## 2015-07-06 DIAGNOSIS — R918 Other nonspecific abnormal finding of lung field: Secondary | ICD-10-CM | POA: Diagnosis not present

## 2015-07-06 DIAGNOSIS — D649 Anemia, unspecified: Secondary | ICD-10-CM | POA: Insufficient documentation

## 2015-07-06 DIAGNOSIS — I482 Chronic atrial fibrillation: Secondary | ICD-10-CM | POA: Diagnosis not present

## 2015-07-06 DIAGNOSIS — I4891 Unspecified atrial fibrillation: Secondary | ICD-10-CM | POA: Diagnosis not present

## 2015-07-06 DIAGNOSIS — I440 Atrioventricular block, first degree: Secondary | ICD-10-CM | POA: Diagnosis not present

## 2015-07-06 DIAGNOSIS — J90 Pleural effusion, not elsewhere classified: Secondary | ICD-10-CM | POA: Diagnosis not present

## 2015-07-06 DIAGNOSIS — Z951 Presence of aortocoronary bypass graft: Secondary | ICD-10-CM | POA: Insufficient documentation

## 2015-07-06 DIAGNOSIS — E119 Type 2 diabetes mellitus without complications: Secondary | ICD-10-CM

## 2015-07-06 DIAGNOSIS — R001 Bradycardia, unspecified: Secondary | ICD-10-CM | POA: Diagnosis not present

## 2015-07-06 DIAGNOSIS — Z794 Long term (current) use of insulin: Secondary | ICD-10-CM | POA: Diagnosis not present

## 2015-07-06 DIAGNOSIS — F419 Anxiety disorder, unspecified: Secondary | ICD-10-CM | POA: Insufficient documentation

## 2015-07-06 DIAGNOSIS — E1169 Type 2 diabetes mellitus with other specified complication: Secondary | ICD-10-CM | POA: Insufficient documentation

## 2015-07-06 DIAGNOSIS — R4182 Altered mental status, unspecified: Secondary | ICD-10-CM | POA: Diagnosis not present

## 2015-07-06 DIAGNOSIS — R404 Transient alteration of awareness: Secondary | ICD-10-CM | POA: Diagnosis not present

## 2015-07-06 DIAGNOSIS — IMO0001 Reserved for inherently not codable concepts without codable children: Secondary | ICD-10-CM | POA: Diagnosis present

## 2015-07-06 LAB — URINALYSIS, ROUTINE W REFLEX MICROSCOPIC
BILIRUBIN URINE: NEGATIVE
Glucose, UA: NEGATIVE mg/dL
KETONES UR: NEGATIVE mg/dL
Leukocytes, UA: NEGATIVE
NITRITE: NEGATIVE
PH: 5.5 (ref 5.0–8.0)
Protein, ur: 30 mg/dL — AB
SPECIFIC GRAVITY, URINE: 1.02 (ref 1.005–1.030)

## 2015-07-06 LAB — COMPREHENSIVE METABOLIC PANEL
ALT: 15 U/L — AB (ref 17–63)
AST: 15 U/L (ref 15–41)
Albumin: 3.1 g/dL — ABNORMAL LOW (ref 3.5–5.0)
Alkaline Phosphatase: 63 U/L (ref 38–126)
Anion gap: 5 (ref 5–15)
BUN: 21 mg/dL — AB (ref 6–20)
CHLORIDE: 103 mmol/L (ref 101–111)
CO2: 28 mmol/L (ref 22–32)
CREATININE: 1.11 mg/dL (ref 0.61–1.24)
Calcium: 8.5 mg/dL — ABNORMAL LOW (ref 8.9–10.3)
GFR calc Af Amer: >60 mL/min (ref >60)
GFR, EST NON AFRICAN AMERICAN: 60 mL/min — AB (ref >60)
Glucose, Bld: 130 mg/dL — ABNORMAL HIGH (ref 65–99)
Potassium: 3.7 mmol/L (ref 3.5–5.1)
Sodium: 136 mmol/L (ref 135–145)
Total Bilirubin: 1.2 mg/dL (ref 0.3–1.2)
Total Protein: 5.7 g/dL — ABNORMAL LOW (ref 6.5–8.1)

## 2015-07-06 LAB — CBC WITH DIFFERENTIAL/PLATELET
BASOS ABS: 0 10*3/uL (ref 0.0–0.1)
Basophils Relative: 0 %
EOS PCT: 0 %
Eosinophils Absolute: 0 10*3/uL (ref 0.0–0.7)
HEMATOCRIT: 36.1 % — AB (ref 39.0–52.0)
HEMOGLOBIN: 12 g/dL — AB (ref 13.0–17.0)
LYMPHS PCT: 21 %
Lymphs Abs: 1.7 10*3/uL (ref 0.7–4.0)
MCH: 32.2 pg (ref 26.0–34.0)
MCHC: 33.2 g/dL (ref 30.0–36.0)
MCV: 96.8 fL (ref 78.0–100.0)
Monocytes Absolute: 0.8 10*3/uL (ref 0.1–1.0)
Monocytes Relative: 10 %
NEUTROS ABS: 5.4 10*3/uL (ref 1.7–7.7)
Neutrophils Relative %: 69 %
PLATELETS: 194 10*3/uL (ref 150–400)
RBC: 3.73 MIL/uL — AB (ref 4.22–5.81)
RDW: 17.3 % — ABNORMAL HIGH (ref 11.5–15.5)
WBC: 7.9 10*3/uL (ref 4.0–10.5)

## 2015-07-06 LAB — URINE MICROSCOPIC-ADD ON

## 2015-07-06 LAB — I-STAT TROPONIN, ED: TROPONIN I, POC: 0.02 ng/mL (ref 0.00–0.08)

## 2015-07-06 MED ORDER — OMEGA-3-ACID ETHYL ESTERS 1 G PO CAPS
1.0000 g | ORAL_CAPSULE | Freq: Every day | ORAL | Status: DC
  Administered 2015-07-07: 1 g via ORAL
  Filled 2015-07-06: qty 1

## 2015-07-06 MED ORDER — LISINOPRIL 10 MG PO TABS
40.0000 mg | ORAL_TABLET | Freq: Every day | ORAL | Status: DC
  Administered 2015-07-07: 40 mg via ORAL
  Filled 2015-07-06: qty 4

## 2015-07-06 MED ORDER — RISAQUAD PO CAPS
1.0000 | ORAL_CAPSULE | Freq: Every day | ORAL | Status: DC
  Administered 2015-07-07: 1 via ORAL
  Filled 2015-07-06 (×2): qty 1

## 2015-07-06 MED ORDER — FUROSEMIDE 40 MG PO TABS
40.0000 mg | ORAL_TABLET | Freq: Every day | ORAL | Status: DC
  Administered 2015-07-07: 40 mg via ORAL
  Filled 2015-07-06: qty 1

## 2015-07-06 MED ORDER — ONDANSETRON HCL 4 MG PO TABS: 4.0000 mg | ORAL_TABLET | Freq: Four times a day (QID) | ORAL | Status: DC | PRN

## 2015-07-06 MED ORDER — SODIUM CHLORIDE 0.9% FLUSH
3.0000 mL | Freq: Two times a day (BID) | INTRAVENOUS | Status: DC
  Administered 2015-07-07 (×2): 3 mL via INTRAVENOUS

## 2015-07-06 MED ORDER — SODIUM CHLORIDE 0.9 % IV SOLN: 250.0000 mL | INTRAVENOUS | Status: DC | PRN

## 2015-07-06 MED ORDER — ENOXAPARIN SODIUM 40 MG/0.4ML ~~LOC~~ SOLN
40.0000 mg | SUBCUTANEOUS | Status: DC
  Administered 2015-07-07: 40 mg via SUBCUTANEOUS
  Filled 2015-07-06: qty 0.4

## 2015-07-06 MED ORDER — POLYETHYLENE GLYCOL 3350 17 G PO PACK: 17.0000 g | PACK | Freq: Every day | ORAL | Status: DC | PRN

## 2015-07-06 MED ORDER — ACETAMINOPHEN 650 MG RE SUPP: 650.0000 mg | Freq: Four times a day (QID) | RECTAL | Status: DC | PRN

## 2015-07-06 MED ORDER — ALLOPURINOL 100 MG PO TABS
100.0000 mg | ORAL_TABLET | Freq: Every day | ORAL | Status: DC
  Administered 2015-07-07: 100 mg via ORAL
  Filled 2015-07-06: qty 1

## 2015-07-06 MED ORDER — ONDANSETRON HCL 4 MG/2ML IJ SOLN: 4.0000 mg | Freq: Four times a day (QID) | INTRAMUSCULAR | Status: DC | PRN

## 2015-07-06 MED ORDER — INSULIN ASPART 100 UNIT/ML ~~LOC~~ SOLN
0.0000 [IU] | Freq: Three times a day (TID) | SUBCUTANEOUS | Status: DC
  Administered 2015-07-07: 2 [IU] via SUBCUTANEOUS

## 2015-07-06 MED ORDER — ACETAMINOPHEN 325 MG PO TABS: 650.0000 mg | ORAL_TABLET | Freq: Four times a day (QID) | ORAL | Status: DC | PRN

## 2015-07-06 MED ORDER — INSULIN GLARGINE 100 UNIT/ML ~~LOC~~ SOLN
35.0000 [IU] | Freq: Every day | SUBCUTANEOUS | Status: DC
  Administered 2015-07-07: 35 [IU] via SUBCUTANEOUS
  Filled 2015-07-06 (×2): qty 0.35

## 2015-07-06 MED ORDER — SODIUM CHLORIDE 0.9 % IV BOLUS (SEPSIS)
500.0000 mL | Freq: Once | INTRAVENOUS | Status: AC
  Administered 2015-07-06: 500 mL via INTRAVENOUS

## 2015-07-06 MED ORDER — CHOLESTYRAMINE 4 G PO PACK
4.0000 g | PACK | Freq: Two times a day (BID) | ORAL | Status: DC
  Administered 2015-07-07: 4 g via ORAL
  Filled 2015-07-06 (×3): qty 1

## 2015-07-06 MED ORDER — VITAMIN D 1000 UNITS PO TABS
2000.0000 [IU] | ORAL_TABLET | Freq: Every day | ORAL | Status: DC
  Administered 2015-07-07: 2000 [IU] via ORAL
  Filled 2015-07-06: qty 2

## 2015-07-06 MED ORDER — ATORVASTATIN CALCIUM 20 MG PO TABS: 20.0000 mg | ORAL_TABLET | Freq: Every day | ORAL | Status: DC

## 2015-07-06 MED ORDER — LORAZEPAM 1 MG PO TABS
1.0000 mg | ORAL_TABLET | Freq: Three times a day (TID) | ORAL | Status: DC | PRN
Start: 2015-07-06 — End: 2015-07-07

## 2015-07-06 MED ORDER — SODIUM CHLORIDE 0.9% FLUSH: 3.0000 mL | INTRAVENOUS | Status: DC | PRN

## 2015-07-06 NOTE — ED Notes (Signed)
Pt ambulated to restroom. 

## 2015-07-06 NOTE — ED Provider Notes (Signed)
CSN: [id number]     Arrival date & time 07/06/15  1830 History   First MD Initiated Contact with Patient 07/06/15 1837     Chief Complaint  Patient presents with  . Loss of Consciousness     (Consider location/radiation/quality/duration/timing/severity/associated sxs/prior Treatment) Patient is a 82 y.o. male presenting with syncope. The history is provided by a relative (Patient's daughter states that he was unresponsive this afternoon from the mammotome. Then when he woke up he was confused. He's been taking 4 mg of Ativan at a time).  Loss of Consciousness Episode history:  Single Most recent episode:  Today Timing:  Constant Progression:  Partially resolved Chronicity:  New Context: not blood draw   Associated symptoms: no chest pain, no headaches and no seizures     Past Medical History  Diagnosis Date  . Diabetes mellitus without complication (HCC)   . Stroke (HCC)   . Hyperlipidemia   . Hypertension   . Gout   . 1St degree AV block   . Neuropathy [facility](HCC)    Past Surgical History  Procedure Laterality Date  . Carpel tunnel Bilateral   . Kidney stone surgery    . Coronary artery bypass graft      heart- x 6  . Parathyroidectomy     Family History  Problem Relation Age of Onset  . Cancer Father     throat  . CAD Brother 1638    MI questionable, sudden death  . CAD Daughter 3933    MI, stent   Social History  Substance Use Topics  . Smoking status: Former Smoker    Types: Cigarettes    Quit date: 06/12/1977  . Smokeless tobacco: None  . Alcohol Use: No    Review of Systems  Constitutional: Negative for appetite change and fatigue.  HENT: Negative for congestion, ear discharge and sinus pressure.   Eyes: Negative for discharge.  Respiratory: Negative for cough.   Cardiovascular: Positive for syncope. Negative for chest pain.  Gastrointestinal: Negative for abdominal pain and diarrhea.  Genitourinary: Negative for frequency and hematuria.  Musculoskeletal:  Negative for back pain.  Skin: Negative for rash.  Neurological: Negative for seizures and headaches.  Psychiatric/Behavioral: Negative for hallucinations.      Allergies  Review of patient's allergies indicates no known allergies.  Home Medications   Prior to Admission medications   Medication Sig Start Date End Date Taking? Authorizing Provider  allopurinol (ZYLOPRIM) 100 MG tablet Take 100 mg by mouth daily.   Yes Historical Provider, MD  atenolol (TENORMIN) 50 MG tablet TAKE ONE TABLET BY MOUTH ONCE DAILY 03/21/15  Yes Ernestina Pennaonald W Moore, MD  atorvastatin (LIPITOR) 20 MG tablet Take 1 tablet (20 mg total) by mouth daily at 6 PM. 02/24/15  Yes Ernestina Pennaonald W Moore, MD  Cholecalciferol (VITAMIN D3) 2000 units TABS Take 2,000 Units by mouth daily.   Yes Historical Provider, MD  cholestyramine light (PREVALITE) 4 g packet Take 1 packet (4 g total) by mouth 2 (two) times daily. 06/02/15  Yes Frederica KusterStephen M Miller, MD  fish oil-omega-3 fatty acids 1000 MG capsule Take 1 g by mouth daily.   Yes Historical Provider, MD  furosemide (LASIX) 40 MG tablet TAKE ONE TABLET BY MOUTH ONCE DAILY 03/21/15  Yes Ernestina Pennaonald W Moore, MD  LANTUS SOLOSTAR 100 UNIT/ML Solostar Pen INJECT 40 UNITS INTO THE SKIN AT BEDTIME 04/14/15  Yes Ernestina Pennaonald W Moore, MD  lisinopril (PRINIVIL,ZESTRIL) 40 MG tablet TAKE ONE TABLET BY MOUTH ONCE DAILY 06/21/15  Yes Ernestina Penna, MD  LORazepam (ATIVAN) 2 MG tablet TAKE ONE TABLET BY MOUTH THREE TIMES DAILY AS NEEDED Patient taking differently: TAKE 0.5  TABLET BY MOUTH AT BEDTIME AS NEEDED FOR ANXIETY 04/06/15  Yes Ernestina Penna, MD  Probiotic Product (PROBIOTIC DAILY PO) Take 1 capsule by mouth daily.   Yes Historical Provider, MD  vancomycin (VANCOCIN HCL) 250 MG capsule Take 1 capsule (250 mg total) by mouth 4 (four) times daily. 06/22/15  Yes Mechele Claude, MD  ONE TOUCH ULTRA TEST test strip USE ONE STRIP TO CHECK GLUCOSE TWICE DAILY TO THREE TIMES DAILY 03/10/15   Ernestina Penna, MD   BP 144/63  mmHg  Pulse 54  Temp(Src) 98.5 F (36.9 C) (Oral)  Resp 12  Ht 5\' 8"  (1.727 m)  Wt 153 lb (69.4 kg)  BMI 23.27 kg/m2  SpO2 94% Physical Exam  Constitutional: He appears well-developed.  HENT:  Head: Normocephalic.  Eyes: Conjunctivae and EOM are normal. No scleral icterus.  Neck: Neck supple. No thyromegaly present.  Cardiovascular: Normal rate and regular rhythm.  Exam reveals no gallop and no friction rub.   No murmur heard. Pulmonary/Chest: No stridor. He has no wheezes. He has no rales. He exhibits no tenderness.  Abdominal: He exhibits no distension. There is no tenderness. There is no rebound.  Musculoskeletal: Normal range of motion. He exhibits no edema.  Lymphadenopathy:    He has no cervical adenopathy.  Neurological: He exhibits normal muscle tone. Coordination normal.  Lethargic oriented to person only  Skin: No rash noted. No erythema.    ED Course  Procedures (including critical care time) Labs Review Labs Reviewed  CBC WITH DIFFERENTIAL/PLATELET - Abnormal; Notable for the following:    RBC 3.73 (*)    Hemoglobin 12.0 (*)    HCT 36.1 (*)    RDW 17.3 (*)    All other components within normal limits  COMPREHENSIVE METABOLIC PANEL - Abnormal; Notable for the following:    Glucose, Bld 130 (*)    BUN 21 (*)    Calcium 8.5 (*)    Total Protein 5.7 (*)    Albumin 3.1 (*)    ALT 15 (*)    GFR calc non Af Amer 60 (*)    All other components within normal limits  URINALYSIS, ROUTINE W REFLEX MICROSCOPIC (NOT AT [facility]) - Abnormal; Notable for the following:    Hgb urine dipstick SMALL (*)    Protein, ur 30 (*)    All other components within normal limits  URINE MICROSCOPIC-ADD ON - Abnormal; Notable for the following:    Squamous Epithelial / LPF 0-5 (*)    Bacteria, UA RARE (*)    Casts HYALINE CASTS (*)    All other components within normal limits  Rosezena Sensor, ED    Imaging Review Dg Chest 2 View  07/06/2015  CLINICAL DATA:  Per EMS family  states patient was found unresponsive. When EMS arrived patient was alert and oriented, with normal vital signs. Patient states feeling weak and does not remember anything. States was being treated for C-diff. EXAM: CHEST - 2 VIEW COMPARISON:  04/22/2015 FINDINGS: Some increase in perihilar and bibasilar interstitial edema or infiltrates. New small pleural effusions, with adjacent consolidation/ atelectasis posteriorly in the lower lobes. Thickening of or fluid in the interlobar fissures. Heart size upper limits normal.  Atheromatous aorta. No pneumothorax. Previous median sternotomy and CABG. IMPRESSION: 1. Worsening bilateral interstitial edema or infiltrates with small effusions. 2. Patchy  atelectasis or infiltrates posteriorly in both lung bases. Electronically Signed   By: Corlis Leak M.D.   On: 07/06/2015 21:19   Mr Brain Wo Contrast  07/06/2015  CLINICAL DATA:  Syncopal episode today with weakness. Personal history of stroke. EXAM: MRI HEAD WITHOUT CONTRAST TECHNIQUE: Multiplanar, multiecho pulse sequences of the brain and surrounding structures were obtained without intravenous contrast. COMPARISON:  CT head without contrast 08/13/2009 FINDINGS: The diffusion-weighted images demonstrate no evidence for acute or subacute infarction. No acute hemorrhage or mass lesion is present. Mild atrophy and white matter changes are within normal limits for age. The internal auditory canals are within normal limits bilaterally. The brainstem and cerebellum are unremarkable. The left vertebral artery is dominant. Flow is present in the major intracranial arteries. Globes and orbits are intact. A fluid level is present in the right maxillary sinus. Anterior ethmoid mucosal thickening is noted bilaterally. The left maxillary sinus bilateral frontal sinuses are clear. The sphenoid sinuses are clear. The mastoid air cells are clear. Skullbase is within normal limits. Midline sagittal images are unremarkable. IMPRESSION: 1.  Normal MRI appearance the brain for age. 2. Acute on chronic right maxillary sinusitis. Electronically Signed   By: Marin Roberts M.D.   On: 07/06/2015 19:56   I have personally reviewed and evaluated these images and lab results as part of my medical decision-making.   EKG Interpretation   Date/Time:  Wednesday July 06 2015 18:48:54 EDT Ventricular Rate:  64 PR Interval:    QRS Duration: 93 QT Interval:  458 QTC Calculation: 473 R Axis:   59 Text Interpretation:  Atrial fibrillation Anteroseptal infarct, age  indeterminate Lateral leads are also involved Confirmed by Mckenleigh Tarlton  MD,  Shadrach Bartunek [phone]) on 07/06/2015 10:58:24 PM      MDM   Final diagnoses:  Disorientation    Labs unremarkable. MRI does not show any acute processes. Chest x-ray shows possible pneumonia. Suspect confusion is related to taking too much Ativan. Patient will be admitted to medicine    Bethann Berkshire, MD 07/06/15 [phone]

## 2015-07-06 NOTE — ED Notes (Signed)
Per EMS family states patient was unresponsive. When EMS arrived patient was alert and oriented, with normal vital signs. Patient states feeling weak and does not remember being losing consciousness. States was being treated for C-diff. States solid stools for past 12 days.

## 2015-07-06 NOTE — H&P (Signed)
History and Physical    Chad [name] ZOX:[id number] DOB: 10-12-1931 DOA: 07/06/2015  PCP: Chad Heap, MD   Patient coming from: Home   Chief Complaint: Found unresponsive   HPI: Chad [name] is a 82 y.o. male with medical history significant for chronic atrial fibrillation previously anticoagulated, hypertension, insulin-dependent diabetes mellitus, chronic normocytic anemia, anxiety, and recent C. difficile colitis who presents to the ED after being found unresponsive by his family. Patient was diagnosed with C. difficile colitis proximately one month ago and treated in the outpatient setting with oral vancomycin. He required extension of the treatment course, but this condition has resolved with stools now formed for the past 2 weeks and no fevers. Patient is also been suffering from considerable anxiety which has been attributed to his wife's struggles with recent diagnosis of terminal breast cancer. He had been prescribed Ativan nightly for sleep, but is recently began taking the 3 times daily for anxiety. He has 2 mg tablets and has reported taking 2 of these at a time. Just prior to his presentation, he was found by his family to be unresponsive and EMS was activated. By time of EMS arrival, patient was reportedly alert and oriented with stable vital signs. There may have been some confusion present on EMS arrival. Patient denies any recent fevers or chills, chest pain or palpitations, dyspnea or significant cough. He denies abdominal pain, vomiting, or diarrhea in the past 2 weeks. He denies headaches, vision or hearing change, focal numbness or weakness, or loss of coordination. Patient does not recall what happened prior to arrival, just noting that he had felt sleepy and then woke up.  ED Course: Upon arrival to the ED, patient is found to be afebrile, saturating well on room air, bradycardic to the low 40s with atrial fibrillation, and with vitals otherwise stable. EKG features  atrial fibrillation and troponin is 0.02. Chest x-ray demonstrates worsening bilateral interstitial edema or infiltrate with small effusions. Patchy atelectasis versus infiltrates is noted in the posterior bases bilaterally. MRI of the brain was obtained and is a normal MRI of the brain. Chemistry panel features and albumin of 3.1 and total protein of 5.7 but is largely unremarkable otherwise. CBC is notable for a hemoglobin of 12.0 with normal MCV, apparently stable relative to prior measurements. Urinalysis is grossly unremarkable. Patient was given a normal saline bolus of 500 mL. He remained intermittently bradycardic in the emergency department but otherwise stable. Will be observed on telemetry with ongoing evaluation and management of possible syncope with concern for a cardiac etiology given his significant bradycardia on telemetry in the ED.  Review of Systems:  All other systems reviewed and apart from HPI, are negative.  Past Medical History  Diagnosis Date  . Diabetes mellitus without complication (HCC)   . Stroke (HCC)   . Hyperlipidemia   . Hypertension   . Gout   . 1St degree AV block   . Neuropathy [facility])     Past Surgical History  Procedure Laterality Date  . Carpel tunnel Bilateral   . Kidney stone surgery    . Coronary artery bypass graft      heart- x 6  . Parathyroidectomy       reports that he quit smoking about 38 years ago. His smoking use included Cigarettes. He does not have any smokeless tobacco history on file. He reports that he does not drink alcohol or use illicit drugs.  No Known Allergies  Family History  Problem Relation Age  of Onset  . Cancer Father     throat  . CAD Brother 45    MI questionable, sudden death  . CAD Daughter 66    MI, stent     Prior to Admission medications   Medication Sig Start Date End Date Taking? Authorizing Provider  allopurinol (ZYLOPRIM) 100 MG tablet Take 100 mg by mouth daily.   Yes Historical Provider, MD    atenolol (TENORMIN) 50 MG tablet TAKE ONE TABLET BY MOUTH ONCE DAILY 03/21/15  Yes Ernestina Penna, MD  atorvastatin (LIPITOR) 20 MG tablet Take 1 tablet (20 mg total) by mouth daily at 6 PM. 02/24/15  Yes Ernestina Penna, MD  Cholecalciferol (VITAMIN D3) 2000 units TABS Take 2,000 Units by mouth daily.   Yes Historical Provider, MD  cholestyramine light (PREVALITE) 4 g packet Take 1 packet (4 g total) by mouth 2 (two) times daily. 06/02/15  Yes Frederica Kuster, MD  fish oil-omega-3 fatty acids 1000 MG capsule Take 1 g by mouth daily.   Yes Historical Provider, MD  furosemide (LASIX) 40 MG tablet TAKE ONE TABLET BY MOUTH ONCE DAILY 03/21/15  Yes Ernestina Penna, MD  LANTUS SOLOSTAR 100 UNIT/ML Solostar Pen INJECT 40 UNITS INTO THE SKIN AT BEDTIME 04/14/15  Yes Ernestina Penna, MD  lisinopril (PRINIVIL,ZESTRIL) 40 MG tablet TAKE ONE TABLET BY MOUTH ONCE DAILY 06/21/15  Yes Ernestina Penna, MD  LORazepam (ATIVAN) 2 MG tablet TAKE ONE TABLET BY MOUTH THREE TIMES DAILY AS NEEDED Patient taking differently: TAKE 0.5  TABLET BY MOUTH AT BEDTIME AS NEEDED FOR ANXIETY 04/06/15  Yes Ernestina Penna, MD  Probiotic Product (PROBIOTIC DAILY PO) Take 1 capsule by mouth daily.   Yes Historical Provider, MD  vancomycin (VANCOCIN HCL) 250 MG capsule Take 1 capsule (250 mg total) by mouth 4 (four) times daily. 06/22/15  Yes Mechele Claude, MD  ONE TOUCH ULTRA TEST test strip USE ONE STRIP TO CHECK GLUCOSE TWICE DAILY TO THREE TIMES DAILY 03/10/15   Ernestina Penna, MD    Physical Exam: Filed Vitals:   07/06/15 2200 07/06/15 2230 07/06/15 2234 07/06/15 2300  BP: 119/99  141/70 144/63  Pulse: 37 99 41 54  Temp:      TempSrc:      Resp:      Height:      Weight:      SpO2: 95% 95% 95% 94%      Constitutional: NAD, calm, comfortable Eyes: PERTLA, lids and conjunctivae normal ENMT: Mucous membranes are moist. Posterior pharynx [name] of any exudate or lesions.   Neck: normal, supple, no masses, no  thyromegaly Respiratory: Coarse rhonchi bilaterally. Normal respiratory effort. No accessory muscle use.  Cardiovascular: rate ~60 and irregular with soft systolic murmur at apex. 1+ pretibial edema b/l. No significant JVD. Abdomen: No distension, no tenderness, no masses palpated. Bowel sounds normal.  Musculoskeletal: no clubbing / cyanosis. No joint deformity upper and lower extremities. Normal muscle tone.  Skin: no significant rashes, lesions, ulcers. Warm, dry, well-perfused. Neurologic: CN 2-12 grossly intact. Sensation intact, DTR normal. Strength 5/5 in all 4 limbs.  Psychiatric: Normal judgment and insight. Alert and oriented x 3. Normal mood and affect.     Labs on Admission: I have personally reviewed following labs and imaging studies  CBC:  Recent Labs Lab 07/06/15 1910  WBC 7.9  NEUTROABS 5.4  HGB 12.0*  HCT 36.1*  MCV 96.8  PLT 194   Basic Metabolic Panel:  Recent  Labs Lab 07/06/15 1910  NA 136  K 3.7  CL 103  CO2 28  GLUCOSE 130*  BUN 21*  CREATININE 1.11  CALCIUM 8.5*   GFR: Estimated Creatinine Clearance: 49.6 mL/min (by C-G formula based on Cr of 1.11). Liver Function Tests:  Recent Labs Lab 07/06/15 1910  AST 15  ALT 15*  ALKPHOS 63  BILITOT 1.2  PROT 5.7*  ALBUMIN 3.1*   No results for input(s): LIPASE, AMYLASE in the last 168 hours. No results for input(s): AMMONIA in the last 168 hours. Coagulation Profile: No results for input(s): INR, PROTIME in the last 168 hours. Cardiac Enzymes: No results for input(s): CKTOTAL, CKMB, CKMBINDEX, TROPONINI in the last 168 hours. BNP (last 3 results) No results for input(s): PROBNP in the last 8760 hours. HbA1C: No results for input(s): HGBA1C in the last 72 hours. CBG: No results for input(s): GLUCAP in the last 168 hours. Lipid Profile: No results for input(s): CHOL, HDL, LDLCALC, TRIG, CHOLHDL, LDLDIRECT in the last 72 hours. Thyroid Function Tests: No results for input(s): TSH,  T4TOTAL, FREET4, T3FREE, THYROIDAB in the last 72 hours. Anemia Panel: No results for input(s): VITAMINB12, FOLATE, FERRITIN, TIBC, IRON, RETICCTPCT in the last 72 hours. Urine analysis:    Component Value Date/Time   COLORURINE YELLOW 07/06/2015 2010   APPEARANCEUR [name] 07/06/2015 2010   LABSPEC 1.020 07/06/2015 2010   PHURINE 5.5 07/06/2015 2010   GLUCOSEU NEGATIVE 07/06/2015 2010   HGBUR SMALL* 07/06/2015 2010   BILIRUBINUR NEGATIVE 07/06/2015 2010   BILIRUBINUR negative 04/13/2013 1030   KETONESUR NEGATIVE 07/06/2015 2010   PROTEINUR 30* 07/06/2015 2010   PROTEINUR negative 04/13/2013 1030   UROBILINOGEN negative 04/13/2013 1030   UROBILINOGEN 0.2 08/12/2009 2205   NITRITE NEGATIVE 07/06/2015 2010   NITRITE negative 04/13/2013 1030   LEUKOCYTESUR NEGATIVE 07/06/2015 2010   Sepsis Labs: @LABRCNTIP (procalcitonin:4,lacticidven:4) )No results found for this or any previous visit (from the past 240 hour(s)).   Radiological Exams on Admission: Dg Chest 2 View  07/06/2015  CLINICAL DATA:  Per EMS family states patient was found unresponsive. When EMS arrived patient was alert and oriented, with normal vital signs. Patient states feeling weak and does not remember anything. States was being treated for C-diff. EXAM: CHEST - 2 VIEW COMPARISON:  04/22/2015 FINDINGS: Some increase in perihilar and bibasilar interstitial edema or infiltrates. New small pleural effusions, with adjacent consolidation/ atelectasis posteriorly in the lower lobes. Thickening of or fluid in the interlobar fissures. Heart size upper limits normal.  Atheromatous aorta. No pneumothorax. Previous median sternotomy and CABG. IMPRESSION: 1. Worsening bilateral interstitial edema or infiltrates with small effusions. 2. Patchy atelectasis or infiltrates posteriorly in both lung bases. Electronically Signed   By: Corlis Leak  Hassell M.D.   On: 07/06/2015 21:19   Mr Brain Wo Contrast  07/06/2015  CLINICAL DATA:  Syncopal episode  today with weakness. Personal history of stroke. EXAM: MRI HEAD WITHOUT CONTRAST TECHNIQUE: Multiplanar, multiecho pulse sequences of the brain and surrounding structures were obtained without intravenous contrast. COMPARISON:  CT head without contrast 08/13/2009 FINDINGS: The diffusion-weighted images demonstrate no evidence for acute or subacute infarction. No acute hemorrhage or mass lesion is present. Mild atrophy and white matter changes are within normal limits for age. The internal auditory canals are within normal limits bilaterally. The brainstem and cerebellum are unremarkable. The left vertebral artery is dominant. Flow is present in the major intracranial arteries. Globes and orbits are intact. A fluid level is present in the right maxillary sinus.  Anterior ethmoid mucosal thickening is noted bilaterally. The left maxillary sinus bilateral frontal sinuses are [name]. The sphenoid sinuses are [name]. The mastoid air cells are [name]. Skullbase is within normal limits. Midline sagittal images are unremarkable. IMPRESSION: 1. Normal MRI appearance the brain for age. 2. Acute on chronic right maxillary sinusitis. Electronically Signed   By: Marin Roberts M.D.   On: 07/06/2015 19:56    EKG: Independently reviewed. Atrial fibrillation   Assessment/Plan  1. ?Syncope  - Pt was found unresponsive by family, recovered quickly and was oriented with normal vitals upon EMS arrival  - Pt reports feeling tired prior to episode, but denies any CP, palpitations, headache, vision change, diaphoresis, or nausea - Bradycardia seems to be asymptomatic in ED, but raises concern for cardiogenic syncope - Also high on Ddx is over-sedation with Ativan; recently started taking two-2mg  tabs up to TID - Monitor on telemetry, check TTE, check orthostatic vitals, taper-down Ativan   2. Bradycardia  - Rate 41-60 in ED with atrial fibrillation; asymptomatic  - No [name] evidence of ischemia on EKG and pt has no  anginal complaints  - Initial troponin 0.02, will obtain serial measurements x3  - Takes atenolol 50 mg qD, will hold for now   - Monitoring on telemetry   3. Atrial fibrillation, chronic  - CHADS-VASc is at least 3 (age x2, CAD, HTN)  - Pt was anticoagulated with warfarin briefly, but per chart notes, had elected on his own to stop warfarin and refused alternative AC  - Holding atenolol in setting of bradycardia to low 40's    4. Anxiety  - Appears to be stable on admission  - Attributed to wife's terminal cancer  - Had been taking Ativan qHS prn insomnia, but has recently admitted to taking two 2 mg tabs at a time up to TID  - Continue Ativan at dose of  TID prn anxiety   5. Hypertension  - At goal at time of admission  - Managed with atenolol at home, currently holding in light of significant bradycardia as discussed above   6. Type II DM  - A1c was 6.7% in February 2017, reflecting good control at that time  - Managed at home with Lantus 40 units qHS  - Continue basal coverage with Lantus, starting with reduced dose  - Check CBG's with meals and qHS  - Low-intensity sliding-scale insulin, adjust prn  - Carb-modified diet when appropriate   7. CAD - Status-post remote CABG  - No anginal complaints, EKG without acute ischemic changes, troponin 0.02  - Managed with Lipitor, Lovaza, lisinopril at home, will continue   8. Normocytic anemia  - Hgb 12.0 on admission and stable relative to recent priors  - No sign of active blood-loss    DVT prophylaxis: sq Lovenox  Code Status: Full Family Communication: Discussed with patient   Disposition Plan: Observe on telemetry   Consults called: None   Admission status: Observation     Briscoe Deutscher, MD [facility] Pager [phone]  If 7PM-7AM, please contact night-coverage [email or web address] Password [email or web address]  07/06/2015, 11:49 PM

## 2015-07-07 ENCOUNTER — Observation Stay (HOSPITAL_COMMUNITY): Payer: Medicare Other

## 2015-07-07 DIAGNOSIS — E119 Type 2 diabetes mellitus without complications: Secondary | ICD-10-CM

## 2015-07-07 DIAGNOSIS — E1169 Type 2 diabetes mellitus with other specified complication: Secondary | ICD-10-CM | POA: Insufficient documentation

## 2015-07-07 DIAGNOSIS — R55 Syncope and collapse: Secondary | ICD-10-CM | POA: Diagnosis not present

## 2015-07-07 DIAGNOSIS — Z794 Long term (current) use of insulin: Secondary | ICD-10-CM

## 2015-07-07 DIAGNOSIS — I4891 Unspecified atrial fibrillation: Secondary | ICD-10-CM | POA: Diagnosis not present

## 2015-07-07 LAB — GLUCOSE, CAPILLARY
Glucose-Capillary: 106 mg/dL — ABNORMAL HIGH (ref 65–99)
Glucose-Capillary: 112 mg/dL — ABNORMAL HIGH (ref 65–99)
Glucose-Capillary: 163 mg/dL — ABNORMAL HIGH (ref 65–99)

## 2015-07-07 LAB — PROCALCITONIN

## 2015-07-07 LAB — TROPONIN I
Troponin I: 0.03 ng/mL (ref <0.031)
Troponin I: 0.03 ng/mL (ref <0.031)

## 2015-07-07 LAB — MRSA PCR SCREENING: MRSA BY PCR: NEGATIVE

## 2015-07-07 NOTE — Progress Notes (Signed)
Pt d/c home w/ family. D/C instructions and education given to pt and family member. Belongings w/ pt.

## 2015-07-07 NOTE — Care Management Obs Status (Signed)
MEDICARE OBSERVATION STATUS NOTIFICATION   Patient Details  Name: Chad [name] MRN: [id number] Date of Birth: [birth year]   Medicare Observation Status Notification Given:  Other (see comment) (discharged <24hrs)    Malcolm Metrohildress, Ridley Schewe Demske, RN 07/07/2015, 1:52 PM

## 2015-07-07 NOTE — Evaluation (Signed)
Physical Therapy Evaluation Patient Details Name: Chad [name] MRN: [id number] DOB: [birth year] Today's Date: 07/07/2015   History of Present Illness  82 yo M found unresponsive by family. Bradycardic on admission. Possible syncope. Recently prescribed Ativan for sleep but has been taking 3x's/day. CXR with worsening B interstitial edema or infiltrate with small pleural effusions, patchy atelectasis vs infiltrates in B posterior bases. MRI of the brain was (-).  Wife recently diagnosed with terminal breast CA. PMH: Chronic A-fib, previously anticoagulated, HTN, DM, Chronic normocytic anemia, anxiety, C-diff, CVA, 1st degree AV block, neuropathy, carpal tunnel, CABG x 6, parathyroidectomy.   Clinical Impression  Pt received in bed, and was agreeable to PT evaluation.  Dtr arrived during evaluation.  Pt expressed that he normally lives alone and is able to complete his ADL's independently, with modification for bating.  Today, pt ambulated 45f with no DME and CGA.  Pt expressed fear of falling, and demonstrates decreased stride length and gait speed consistent with this.  However, pt has not experienced any falls in the last 6 months.  Pt expressed that he feels he cannot take care of himself, he is not able to cook, and if he does, he's afraid he will leave a burner on, and start a fire.  Pt is recommended for ALF, and [facility]  However, upon speaking with care management, [facility] is a SNF, and does not have an ALF.  Continued recommendations for ALF with HHPT upon d/c.      Follow Up Recommendations Home health PT;Other (comment) (ALF - [facility] and family would like [facility]- Pt's wife is in hospice care here)    Equipment Recommendations  None recommended by PT    Recommendations for Other Services       Precautions / Restrictions Precautions Precautions: None Restrictions Weight Bearing Restrictions: No      Mobility  Bed  Mobility Overal bed mobility: Modified Independent                Transfers Overall transfer level: Needs assistance   Transfers: Sit to/from Stand Sit to Stand: Supervision            Ambulation/Gait Ambulation/Gait assistance: Min guard Ambulation Distance (Feet): 400 Feet Assistive device: None Gait Pattern/deviations: Step-through pattern;Decreased step length - left;Decreased step length - right     General Gait Details: Pt with decreased cadence and shortened stride length - pt expresses fear of falling, but does not demonstrate any LOB.   Stairs            Wheelchair Mobility    Modified Rankin (Stroke Patients Only)       Balance Overall balance assessment: No apparent balance deficits (not formally assessed)                                           Pertinent Vitals/Pain Pain Assessment: No/denies pain    Home Living   Living Arrangements: Alone Available Help at Discharge: Family;Available PRN/intermittently (Dtr comes over to help occasionally. ) Type of Home: House Home Access: Level entry     Home Layout: One level Home Equipment: Bedside commode;Cane - single point;Wheelchair - manual      Prior Function Level of Independence: Needs assistance   Gait / Transfers Assistance Needed: Pt ambulates with occasional use of a cane  ADL's / [employer]  Needed: Pt states that he is independent with dressing, however, he is feaful of showering, and has been taking sponge baths.  Pt states that he cannot cook, and that he is at the point where he is unable to take care of himself.  Pt states "I can't go home like this."  Dtr states they already had an appointment with his PCP for a referral to ALF at [facility] where his wife is in hospice care.         Hand Dominance        Extremity/Trunk Assessment   Upper Extremity Assessment: Overall WFL for tasks assessed           Lower Extremity Assessment:  Overall WFL for tasks assessed         Communication   Communication: No difficulties  Cognition Arousal/Alertness: Awake/alert Behavior During Therapy: WFL for tasks assessed/performed Overall Cognitive Status: Within Functional Limits for tasks assessed                      General Comments      Exercises        Assessment/Plan    PT Assessment All further PT needs can be met in the next venue of care  PT Diagnosis Generalized weakness   PT Problem List Decreased strength;Decreased activity tolerance;Decreased balance;Decreased mobility;Decreased knowledge of use of DME  PT Treatment Interventions     PT Goals (Current goals can be found in the Care Plan section) Acute Rehab PT Goals Patient Stated Goal: Pt expressed that he wants to get stronger.  PT Goal Formulation: With patient Time For Goal Achievement: 07/14/15 Potential to Achieve Goals: Good    Frequency     Barriers to discharge        Co-evaluation               End of Session Equipment Utilized During Treatment: Gait belt Activity Tolerance: Patient tolerated treatment well Patient left: in chair      Functional Assessment Tool Used: [employer] AM-PAC "6-clicks" and clinical judgement.  Functional Limitation: Mobility: Walking and moving around Mobility: Walking and Moving Around Current Status [phone]): At least 20 percent but less than 40 percent impaired, limited or restricted Mobility: Walking and Moving Around Goal Status [phone]): At least 1 percent but less than 20 percent impaired, limited or restricted    Time: 1003-1027 PT Time Calculation (min) (ACUTE ONLY): 24 min   Charges:   PT Evaluation $PT Eval Low Complexity: 1 Procedure PT Treatments $Gait Training: 8-22 mins   PT G Codes:   PT G-Codes **NOT FOR INPATIENT CLASS** Functional Assessment Tool Used: [employer] AM-PAC "6-clicks" and clinical judgement.  Functional Limitation: Mobility: Walking and  moving around Mobility: Walking and Moving Around Current Status [phone]): At least 20 percent but less than 40 percent impaired, limited or restricted Mobility: Walking and Moving Around Goal Status [phone]): At least 1 percent but less than 20 percent impaired, limited or restricted    Beth Shaunte Weissinger, PT, DPT X: [phone]

## 2015-07-07 NOTE — Clinical Social Work Note (Signed)
Clinical Social Work Assessment  Patient Details  Name: Chad [name] MRN: [id number] Date of Birth: May 11, 1931  Date of referral:  07/07/15               Reason for consult:  Facility Placement                Permission sought to share information with:    Permission granted to share information::     Name::        Agency::     Relationship::     Contact Information:     Housing/Transportation Living arrangements for the past 2 months:  Single Family Home Source of Information:  Patient, Adult Children Patient Interpreter Needed:  None Criminal Activity/Legal Involvement Pertinent to Current Situation/Hospitalization:  No - Comment as needed Significant Relationships:  Adult Children, Spouse Lives with:  Self Do you feel safe going back to the place where you live?  No (Patient indicates that he is afraid of falling in the home and that he is home alone. ) Need for family participation in patient care:  Yes (Comment)  Care giving concerns:  Patient lives alone.  He indicates that while he is able to complete ADLs independently, he is afraid that he will call.     Social Worker assessment / plan: CSW met with patient. His daughter, Zylon Creamer, and other family members were at bedside.  Ms. Clabo provided most of the history.  She advised that she prior to admission, patient was working with his PCP on completing paperwork for him to go to [facility].  Patient advised that his wife is at [facility]. CSW discussed PT recommedation for ALF.  Ms. Manrique and patient advised that they desired SNF. CSW discussed the patient's insurance would not pay for SNF with and ALF recommendation from PT.  CSW discussed private pay options.  Patient and his daughter stated that they would go home and follow-up with patient's PCP, Dr. Laurance Flatten, who could further assist them in SNF placement. CSW provided family with an ALF listing for the area. CSW signing off.   Employment status:   Retired Nurse, adult PT Recommendations:   (ALF) Information / Referral to community resources:  Other (Comment Required) (ALF listing)  Patient/Family's Response to care:  Family does not desire to go to ALF. They desire SNF.   Patient/Family's Understanding of and Emotional Response to Diagnosis, Current Treatment, and Prognosis: Patient and family appear to understand patient's diagnosis, treatment and prognosis. They are disappointed that patient was not recommended for SNF.   Emotional Assessment Appearance:  Appears stated age Attitude/Demeanor/Rapport:   (Cooperative) Affect (typically observed):  Calm Orientation:  Oriented to Situation, Oriented to  Time, Oriented to Place, Oriented to Self Alcohol / Substance use:  Not Applicable Psych involvement (Current and /or in the community):  No (Comment)  Discharge Needs  Concerns to be addressed:  Discharge Planning Concerns Readmission within the last 30 days:  No Current discharge risk:  Lives alone Barriers to Discharge:  No Barriers Identified   Ihor Gully, LCSW 07/07/2015, 11:15 AM

## 2015-07-07 NOTE — Discharge Summary (Signed)
Physician Discharge Summary  Chad LeighHoward J Limb ZOX:[id number] DOB: Apr 30, 1931 DOA: 07/06/2015  PCP: Rudi HeapMOORE, DONALD, MD  Admit date: 07/06/2015 Discharge date: 07/07/2015  Time spent: 45 minutes  Recommendations for Outpatient Follow-up:  -Will be discharged home today. -Home health services will be arranged. -Advised to follow-up with primary care provider in 2 weeks.   Discharge Diagnoses:  Principal Problem:   Syncope Active Problems:   HTN (hypertension)   Hyperlipidemia LDL goal <70   Diabetes mellitus, insulin dependent (IDDM), uncontrolled (HCC)   ASCVD (arteriosclerotic cardiovascular disease)   Obstructive sleep apnea   Atrial fibrillation, unspecified   Long term (current) use of anticoagulants   Anxiety   Altered mental state   Bradycardia with 41 - 50 beats per minute   Insulin dependent diabetes mellitus (HCC)   Discharge Condition: Stable and improved  Filed Weights   07/06/15 1840 07/07/15 0045 07/07/15 0500  Weight: 69.4 kg (153 lb) 67.8 kg (149 lb 7.6 oz) 67.5 kg (148 lb 13 oz)    History of present illness:  As per Dr. Antionette Charpyd 6/14: Chad [name] is a 82 y.o. male with medical history significant for chronic atrial fibrillation previously anticoagulated, hypertension, insulin-dependent diabetes mellitus, chronic normocytic anemia, anxiety, and recent C. difficile colitis who presents to the ED after being found unresponsive by his family. Patient was diagnosed with C. difficile colitis proximately one month ago and treated in the outpatient setting with oral vancomycin. He required extension of the treatment course, but this condition has resolved with stools now formed for the past 2 weeks and no fevers. Patient is also been suffering from considerable anxiety which has been attributed to his wife's struggles with recent diagnosis of terminal breast cancer. He had been prescribed Ativan nightly for sleep, but is recently began taking the 3 times daily for  anxiety. He has 2 mg tablets and has reported taking 2 of these at a time. Just prior to his presentation, he was found by his family to be unresponsive and EMS was activated. By time of EMS arrival, patient was reportedly alert and oriented with stable vital signs. There may have been some confusion present on EMS arrival. Patient denies any recent fevers or chills, chest pain or palpitations, dyspnea or significant cough. He denies abdominal pain, vomiting, or diarrhea in the past 2 weeks. He denies headaches, vision or hearing change, focal numbness or weakness, or loss of coordination. Patient does not recall what happened prior to arrival, just noting that he had felt sleepy and then woke up.  ED Course: Upon arrival to the ED, patient is found to be afebrile, saturating well on room air, bradycardic to the low 40s with atrial fibrillation, and with vitals otherwise stable. EKG features atrial fibrillation and troponin is 0.02. Chest x-ray demonstrates worsening bilateral interstitial edema or infiltrate with small effusions. Patchy atelectasis versus infiltrates is noted in the posterior bases bilaterally. MRI of the brain was obtained and is a normal MRI of the brain. Chemistry panel features and albumin of 3.1 and total protein of 5.7 but is largely unremarkable otherwise. CBC is notable for a hemoglobin of 12.0 with normal MCV, apparently stable relative to prior measurements. Urinalysis is grossly unremarkable. Patient was given a normal saline bolus of 500 mL. He remained intermittently bradycardic in the emergency department but otherwise stable. Will be observed on telemetry with ongoing evaluation and management of possible syncope with concern for a cardiac etiology given his significant bradycardia on telemetry in the  ED.  Hospital Course:  Acute encephalopathy -Resolved, back at baseline. -After further conversations with family it does not appear that he had a teacher syncopal  event. -Patient usually takes one Ativan at bedtime and he took 2. Most likely polypharmacy is the reason for his increased level of sedation. -Have advised him on correct use of Ativan.   Atrial fibrillation -Heart rate has been in the 50s. -He is not chronically anticoagulated.  Generalized weakness -Seen by physical therapy with recommendations for home health versus ALF. -Family is unable to pay privately for ALF and as such will be sent home with home health services.  Procedures:  None   Consultations:  None  Discharge Instructions  Discharge Instructions    Diet - low sodium heart healthy    Complete by:  As directed      Increase activity slowly    Complete by:  As directed             Medication List    STOP taking these medications        atenolol 50 MG tablet  Commonly known as:  TENORMIN     vancomycin 250 MG capsule  Commonly known as:  VANCOCIN HCL      TAKE these medications        allopurinol 100 MG tablet  Commonly known as:  ZYLOPRIM  Take 100 mg by mouth daily.     atorvastatin 20 MG tablet  Commonly known as:  LIPITOR  Take 1 tablet (20 mg total) by mouth daily at 6 PM.     cholestyramine light 4 g packet  Commonly known as:  PREVALITE  Take 1 packet (4 g total) by mouth 2 (two) times daily.     fish oil-omega-3 fatty acids 1000 MG capsule  Take 1 g by mouth daily.     furosemide 40 MG tablet  Commonly known as:  LASIX  TAKE ONE TABLET BY MOUTH ONCE DAILY     LANTUS SOLOSTAR 100 UNIT/ML Solostar Pen  Generic drug:  Insulin Glargine  INJECT 40 UNITS INTO THE SKIN AT BEDTIME     lisinopril 40 MG tablet  Commonly known as:  PRINIVIL,ZESTRIL  TAKE ONE TABLET BY MOUTH ONCE DAILY     LORazepam 2 MG tablet  Commonly known as:  ATIVAN  TAKE ONE TABLET BY MOUTH THREE TIMES DAILY AS NEEDED     ONE TOUCH ULTRA TEST test strip  Generic drug:  glucose blood  USE ONE STRIP TO CHECK GLUCOSE TWICE DAILY TO THREE TIMES DAILY      PROBIOTIC DAILY PO  Take 1 capsule by mouth daily.     Vitamin D3 2000 units Tabs  Take 2,000 Units by mouth daily.       No Known Allergies     Follow-up Information    Follow up with Rudi Heap, MD. Schedule an appointment as soon as possible for a visit in 2 weeks.   Specialty:  Family Medicine   Contact information:   [address] [place] [address] [phone]        The results of significant diagnostics from this hospitalization (including imaging, microbiology, ancillary and laboratory) are listed below for reference.    Significant Diagnostic Studies: Dg Chest 2 View  07/06/2015  CLINICAL DATA:  Per EMS family states patient was found unresponsive. When EMS arrived patient was alert and oriented, with normal vital signs. Patient states feeling weak and does not remember anything. States was being treated  for C-diff. EXAM: CHEST - 2 VIEW COMPARISON:  04/22/2015 FINDINGS: Some increase in perihilar and bibasilar interstitial edema or infiltrates. New small pleural effusions, with adjacent consolidation/ atelectasis posteriorly in the lower lobes. Thickening of or fluid in the interlobar fissures. Heart size upper limits normal.  Atheromatous aorta. No pneumothorax. Previous median sternotomy and CABG. IMPRESSION: 1. Worsening bilateral interstitial edema or infiltrates with small effusions. 2. Patchy atelectasis or infiltrates posteriorly in both lung bases. Electronically Signed   By: Corlis Leak M.D.   On: 07/06/2015 21:19   Mr Brain Wo Contrast  07/06/2015  CLINICAL DATA:  Syncopal episode today with weakness. Personal history of stroke. EXAM: MRI HEAD WITHOUT CONTRAST TECHNIQUE: Multiplanar, multiecho pulse sequences of the brain and surrounding structures were obtained without intravenous contrast. COMPARISON:  CT head without contrast 08/13/2009 FINDINGS: The diffusion-weighted images demonstrate no evidence for acute or subacute infarction. No acute hemorrhage or  mass lesion is present. Mild atrophy and white matter changes are within normal limits for age. The internal auditory canals are within normal limits bilaterally. The brainstem and cerebellum are unremarkable. The left vertebral artery is dominant. Flow is present in the major intracranial arteries. Globes and orbits are intact. A fluid level is present in the right maxillary sinus. Anterior ethmoid mucosal thickening is noted bilaterally. The left maxillary sinus bilateral frontal sinuses are clear. The sphenoid sinuses are clear. The mastoid air cells are clear. Skullbase is within normal limits. Midline sagittal images are unremarkable. IMPRESSION: 1. Normal MRI appearance the brain for age. 2. Acute on chronic right maxillary sinusitis. Electronically Signed   By: Marin Roberts M.D.   On: 07/06/2015 19:56    Microbiology: Recent Results (from the past 240 hour(s))  MRSA PCR Screening     Status: None   Collection Time: 07/07/15 12:38 AM  Result Value Ref Range Status   MRSA by PCR NEGATIVE NEGATIVE Final    Comment:        The GeneXpert MRSA Assay (FDA approved for NASAL specimens only), is one component of a comprehensive MRSA colonization surveillance program. It is not intended to diagnose MRSA infection nor to guide or monitor treatment for MRSA infections.      Labs: Basic Metabolic Panel:  Recent Labs Lab 07/06/15 1910  NA 136  K 3.7  CL 103  CO2 28  GLUCOSE 130*  BUN 21*  CREATININE 1.11  CALCIUM 8.5*   Liver Function Tests:  Recent Labs Lab 07/06/15 1910  AST 15  ALT 15*  ALKPHOS 63  BILITOT 1.2  PROT 5.7*  ALBUMIN 3.1*   No results for input(s): LIPASE, AMYLASE in the last 168 hours. No results for input(s): AMMONIA in the last 168 hours. CBC:  Recent Labs Lab 07/06/15 1910  WBC 7.9  NEUTROABS 5.4  HGB 12.0*  HCT 36.1*  MCV 96.8  PLT 194   Cardiac Enzymes:  Recent Labs Lab 07/07/15 0049 07/07/15 0654  TROPONINI 0.03 0.03    BNP: BNP (last 3 results)  Recent Labs  06/29/15 1229  BNP 1261.2*    ProBNP (last 3 results) No results for input(s): PROBNP in the last 8760 hours.  CBG:  Recent Labs Lab 07/07/15 0052 07/07/15 0801  GLUCAP 106* 163*       Signed:  HERNANDEZ ACOSTA,ESTELA  [facility] Pager: [phone] 07/07/2015, 12:12 PM

## 2015-07-07 NOTE — Care Management Note (Signed)
Case Management Note  Patient Details  Name: Chad [name] J Mair MRN: [id number] Date of Birth: 17-May-1931  Subjective/Objective:                  Pt admitted with syncope. Pt is from home, lives alone, [facility]. [facility] because he feels he should not be home alone. States he can't go stay with family because he will be in the way. PT has worked with pt and he is not appropriate for STR, they recommend ALF, pt states he is unable to pay for that and [facility] Pt asked about HH services and he deferred to his daughter, Efraim KaufmannMelissa, who [facility] and has chosen [facility] from list of providers. Pt/daughter aware HH has 48 hours to make the first visit. Alroy BailiffLinda Lothian, of [facility] made aware of referral and will obtain pt info from chart. Pt has no DME needs and has PCP appointment tomorrow.   Action/Plan: Discharging today with HH.   Expected Discharge Date:  07/07/15               Expected Discharge Plan:  Home w Home Health Services  In-House Referral:  Clinical Social Work  Discharge planning Services  CM Consult  Post Acute Care Choice:  Home Health Choice offered to:  Adult Children, Patient  DME Arranged:    DME Agency:     HH Arranged:  RN, PT, Social Work [facility]  [facility]  Status of Service:  Completed, signed off  Medicare Important Message Given:    Date Medicare IM Given:    Medicare IM give by:    Date Additional Medicare IM Given:    Additional Medicare Important Message give by:     If discussed at Long Length of Stay Meetings, dates discussed:    Additional Comments:  Malcolm MetroChildress, Kloe Oates Demske, RN 07/07/2015, 1:54 PM

## 2015-07-08 ENCOUNTER — Ambulatory Visit (INDEPENDENT_AMBULATORY_CARE_PROVIDER_SITE_OTHER): Payer: Medicare Other | Admitting: Family Medicine

## 2015-07-08 ENCOUNTER — Encounter: Payer: Self-pay | Admitting: Family Medicine

## 2015-07-08 VITALS — BP 135/59 | HR 59 | Temp 97.4°F | Ht 68.0 in | Wt 155.0 lb

## 2015-07-08 DIAGNOSIS — I481 Persistent atrial fibrillation: Secondary | ICD-10-CM

## 2015-07-08 DIAGNOSIS — F329 Major depressive disorder, single episode, unspecified: Secondary | ICD-10-CM | POA: Diagnosis not present

## 2015-07-08 DIAGNOSIS — F32A Depression, unspecified: Secondary | ICD-10-CM

## 2015-07-08 DIAGNOSIS — R2681 Unsteadiness on feet: Secondary | ICD-10-CM | POA: Diagnosis not present

## 2015-07-08 DIAGNOSIS — E785 Hyperlipidemia, unspecified: Secondary | ICD-10-CM | POA: Diagnosis not present

## 2015-07-08 DIAGNOSIS — R4181 Age-related cognitive decline: Secondary | ICD-10-CM

## 2015-07-08 DIAGNOSIS — I2510 Atherosclerotic heart disease of native coronary artery without angina pectoris: Secondary | ICD-10-CM | POA: Diagnosis not present

## 2015-07-08 DIAGNOSIS — R451 Restlessness and agitation: Secondary | ICD-10-CM | POA: Diagnosis not present

## 2015-07-08 DIAGNOSIS — E1169 Type 2 diabetes mellitus with other specified complication: Secondary | ICD-10-CM

## 2015-07-08 DIAGNOSIS — R531 Weakness: Secondary | ICD-10-CM | POA: Diagnosis not present

## 2015-07-08 DIAGNOSIS — I4819 Other persistent atrial fibrillation: Secondary | ICD-10-CM

## 2015-07-08 LAB — HEMOGLOBIN A1C
HEMOGLOBIN A1C: 6.2 % — AB (ref 4.8–5.6)
Mean Plasma Glucose: 131 mg/dL

## 2015-07-08 MED ORDER — ESCITALOPRAM OXALATE 5 MG PO TABS: 5.0000 mg | ORAL_TABLET | Freq: Every day | ORAL | Status: DC

## 2015-07-08 NOTE — Patient Instructions (Signed)
The family will take the patient to see nursing facilities to see if he would like to be there and He can check his financial issues with them when he makes his visits He should start the antidepressant 5 mg 1 at bedtime He should finish up the antibiotic for the C. Difficile He should be careful and did not put himself at risk for falling

## 2015-07-08 NOTE — Progress Notes (Signed)
Subjective:    Patient ID: Chad CreaseHoward J [name], male    DOB: 1931/04/13, 82 y.o.   MRN: [id number]  HPI Patient here today for hospital follow up [facility] where he was seen on 07/06/15 for weakness and disorientation. He is accompanied today by his daughter. It was decided after the hospitalization and most of the lab work was normal that the patient's recent for being somnolent and adduct patch was due to the fact that he taking 2 lorazepam that evening before hand. All the tests in the hospital were good and stable and the patient quickly came around. This is some concern because the patient had a bout with severe gastroenteritis-the water weight secondary to this. He also is dealing with the fact that his wife is in the nursing home with cancer. He lives by himself and has to fend for himself with his children checking on him periodically. On the depression screening today, he scored a 15 on this. He is currently not taking anything for depression. The patient is alert today and is expressing a desire to go to some type of nursing facility. His daughter is going to take him to visit assisted living and other such guild nursing facilities to see what he would like today. Since he is been discharged from the hospital he denies any chest pain or shortness of breath. His stomach is doing better and is getting his appetite back and he is still completing his antibiotics for C. difficile. His stools are more formed. He does have somewhat of a flat affect. We've talked about depression and we will consider starting him on an antidepressant because of his score on the depression scale. He denies any trouble passing his water. He is no longer taking lorazepam. Today we will order Home health - physical therapy and RN to evaluate. Pt is having mental changes, decline in health, gait instability, and he needs strengthening exercise.      Patient Active Problem List   Diagnosis Date Noted  . Insulin  dependent diabetes mellitus (HCC)   . Syncope 07/06/2015  . Anxiety 07/06/2015  . Altered mental state 07/06/2015  . Bradycardia with 41 - 50 beats per minute 07/06/2015  . Hyperuricemia 02/23/2015  . Thoracic aorta atherosclerosis (HCC) 02/23/2015  . Senile purpura (HCC) 02/23/2015  . Thrombocytopenia (HCC) 05/04/2014  . Long term (current) use of anticoagulants 08/20/2013  . Atrial fibrillation, unspecified 08/19/2013  . BPH (benign prostatic hyperplasia) 04/13/2013  . ASCVD (arteriosclerotic cardiovascular disease) 11/13/2012  . Obstructive sleep apnea 11/13/2012  . HTN (hypertension) 06/12/2012  . Hyperlipidemia LDL goal <70 06/12/2012  . Diabetes mellitus, insulin dependent (IDDM), uncontrolled (HCC) 06/12/2012   Outpatient Encounter Prescriptions as of 07/08/2015  Medication Sig  . allopurinol (ZYLOPRIM) 100 MG tablet Take 100 mg by mouth daily.  Marland Kitchen. atorvastatin (LIPITOR) 20 MG tablet Take 1 tablet (20 mg total) by mouth daily at 6 PM.  . Cholecalciferol (VITAMIN D3) 2000 units TABS Take 2,000 Units by mouth daily.  . cholestyramine light (PREVALITE) 4 g packet Take 1 packet (4 g total) by mouth 2 (two) times daily.  . fish oil-omega-3 fatty acids 1000 MG capsule Take 1 g by mouth daily.  . furosemide (LASIX) 40 MG tablet TAKE ONE TABLET BY MOUTH ONCE DAILY  . LANTUS SOLOSTAR 100 UNIT/ML Solostar Pen INJECT 40 UNITS INTO THE SKIN AT BEDTIME  . lisinopril (PRINIVIL,ZESTRIL) 40 MG tablet TAKE ONE TABLET BY MOUTH ONCE DAILY  . ONE TOUCH ULTRA TEST  test strip USE ONE STRIP TO CHECK GLUCOSE TWICE DAILY TO THREE TIMES DAILY  . Probiotic Product (PROBIOTIC DAILY PO) Take 1 capsule by mouth daily.  Marland Kitchen LORazepam (ATIVAN) 2 MG tablet TAKE ONE TABLET BY MOUTH THREE TIMES DAILY AS NEEDED (Patient not taking: Reported on 07/08/2015)   No facility-administered encounter medications on file as of 07/08/2015.     Review of Systems  HENT: Negative.   Eyes: Negative.   Respiratory: Negative.     Cardiovascular: Negative.   Gastrointestinal: Negative.   Endocrine: Negative.   Genitourinary: Negative.   Musculoskeletal: Negative.   Skin: Negative.   Allergic/Immunologic: Negative.   Neurological: Positive for weakness.  Hematological: Negative.   Psychiatric/Behavioral: The patient is nervous/anxious (worse in the evenings).        Objective:   Physical Exam  Constitutional: He is oriented to person, place, and time. He appears well-developed and well-nourished. No distress.  HENT:  Head: Normocephalic and atraumatic.  Right Ear: External ear normal.  Left Ear: External ear normal.  Nose: Nose normal.  Mouth/Throat: Oropharynx is clear and moist. No oropharyngeal exudate.  Eyes: Conjunctivae and EOM are normal. Pupils are equal, round, and reactive to light. Right eye exhibits no discharge. Left eye exhibits no discharge. No scleral icterus.  Neck: Normal range of motion. Neck supple. No thyromegaly present.  Cardiovascular: Normal rate and normal heart sounds.   No murmur heard. Heart is irregular irregular at 60/m  Pulmonary/Chest: Effort normal and breath sounds normal. No respiratory distress. He has no wheezes. He has no rales. He exhibits no tenderness.  Clear anteriorly and posteriorly  Abdominal: Soft. Bowel sounds are normal. He exhibits no mass. There is no tenderness. There is no rebound and no guarding.  No abdominal tenderness  Musculoskeletal: Normal range of motion. He exhibits edema. He exhibits no tenderness.  Lymphadenopathy:    He has no cervical adenopathy.  Neurological: He is alert and oriented to person, place, and time. No cranial nerve deficit.  Skin: Skin is warm and dry. No rash noted.  Psychiatric: His behavior is normal. Judgment and thought content normal.  The patient's mood and affect is flat.  Nursing note and vitals reviewed.   BP 135/59 mmHg  Pulse 59  Temp(Src) 97.4 F (36.3 C) (Oral)  Ht  (1.727 m)  Wt 155 lb (70.308 kg)   BMI 23.57 kg/m2       Assessment & Plan:  1. Gait instability - Home Health - Face-to-face encounter (required for Medicare/Medicaid patients)  2. Weakness - Home Health - Face-to-face encounter (required for Medicare/Medicaid patients)  3. Agitation -This is improved. - Home Health - Face-to-face encounter (required for Medicare/Medicaid patients)  4. Age-related cognitive decline - Home Health - Face-to-face encounter (required for Medicare/Medicaid patients)  5. Depression -We will start Lexapro 5 mg 1 at bedtime and recheck him in 2-3 weeks  6. ASCVD (arteriosclerotic cardiovascular disease) -Follow-up with cardiology as planned  7. Persistent atrial fibrillation (HCC) -Follow-up with cardiology as planned  8. Hyperlipidemia -Continue aggressive therapeutic lifestyle changes which include diet and exercise  9. Type 2 diabetes mellitus with hyperlipidemia (HCC) -Continue current treatment  Patient Instructions  The family will take the patient to see nursing facilities to see if he would like to be there and He can check his financial issues with them when he makes his visits He should start the antidepressant 5 mg 1 at bedtime He should finish up the antibiotic for the C. Difficile He should  be careful and did not put himself at risk for falling   .  No orders of the defined types were placed in this encounter.   Nyra Capes MD

## 2015-07-09 DIAGNOSIS — E119 Type 2 diabetes mellitus without complications: Secondary | ICD-10-CM | POA: Diagnosis not present

## 2015-07-10 DIAGNOSIS — G4733 Obstructive sleep apnea (adult) (pediatric): Secondary | ICD-10-CM | POA: Diagnosis not present

## 2015-07-10 DIAGNOSIS — I4891 Unspecified atrial fibrillation: Secondary | ICD-10-CM | POA: Diagnosis not present

## 2015-07-10 DIAGNOSIS — R001 Bradycardia, unspecified: Secondary | ICD-10-CM | POA: Diagnosis not present

## 2015-07-10 DIAGNOSIS — E119 Type 2 diabetes mellitus without complications: Secondary | ICD-10-CM | POA: Diagnosis not present

## 2015-07-10 DIAGNOSIS — Z794 Long term (current) use of insulin: Secondary | ICD-10-CM | POA: Diagnosis not present

## 2015-07-10 DIAGNOSIS — T424X1D Poisoning by benzodiazepines, accidental (unintentional), subsequent encounter: Secondary | ICD-10-CM | POA: Diagnosis not present

## 2015-07-10 DIAGNOSIS — I2510 Atherosclerotic heart disease of native coronary artery without angina pectoris: Secondary | ICD-10-CM | POA: Diagnosis not present

## 2015-07-10 DIAGNOSIS — F341 Dysthymic disorder: Secondary | ICD-10-CM | POA: Diagnosis not present

## 2015-07-10 DIAGNOSIS — Z7901 Long term (current) use of anticoagulants: Secondary | ICD-10-CM | POA: Diagnosis not present

## 2015-07-10 DIAGNOSIS — E785 Hyperlipidemia, unspecified: Secondary | ICD-10-CM | POA: Diagnosis not present

## 2015-07-18 ENCOUNTER — Ambulatory Visit (INDEPENDENT_AMBULATORY_CARE_PROVIDER_SITE_OTHER): Payer: Medicare Other | Admitting: Family Medicine

## 2015-07-18 ENCOUNTER — Encounter: Payer: Self-pay | Admitting: Family Medicine

## 2015-07-18 VITALS — BP 119/57 | HR 51 | Temp 96.8°F | Ht 68.0 in | Wt 144.8 lb

## 2015-07-18 DIAGNOSIS — A0472 Enterocolitis due to Clostridium difficile, not specified as recurrent: Secondary | ICD-10-CM

## 2015-07-18 DIAGNOSIS — R197 Diarrhea, unspecified: Secondary | ICD-10-CM | POA: Diagnosis not present

## 2015-07-18 DIAGNOSIS — A047 Enterocolitis due to Clostridium difficile: Secondary | ICD-10-CM | POA: Diagnosis not present

## 2015-07-18 MED ORDER — METRONIDAZOLE 500 MG PO TABS: 500.0000 mg | ORAL_TABLET | Freq: Three times a day (TID) | ORAL | Status: DC

## 2015-07-18 NOTE — Progress Notes (Signed)
   HPI  Patient presents today with diarrhea.  She describes greater than 12 loose stools yesterday. He states that he has a history of C. difficile treated with oral vancomycin. He states that he has complete resolution of symptoms while he is on medicine but in 3-4 days after he stops he gets recurrence of symptoms.  He denies fever, chills, sweats, or abdominal pain. He is feeling weak due to repeated diarrhea.  He's had repeated diarrhea like this for the last 3 days.  He had his daughter would like to see a GI doctor to discuss further treatment.  PMH: Smoking status noted ROS: Per HPI  Objective: BP 119/57 mmHg  Pulse 51  Temp(Src) 96.8 F (36 C) (Oral)  Ht _0  (1.727 m)  Wt 144 lb 12.8 oz (65.681 kg)  BMI 22.02 kg/m2 Gen: NAD, alert, cooperative with exam HEENT: NCAT CV: RRR, good S1/S2, no murmur Resp: CTABL, no wheezes, non-labored Abd: SNTND, BS present, no guarding or organomegaly Ext: No edema, warm Neuro: Alert and oriented, No gross deficits  Assessment and plan:  # Diarrhea, Clostridium difficile Recent history with C. difficile diarrhea, treated with oral vancomycin The patient is concerned he has recurrence Checking CBC today, his abdominal exam is reassuring Treating with Flagyl to start with, I have asked him to collect a sample today before he starts the medication. I also referred him to GI for their opinion. Return to clinic with any concerns, worsening symptoms or failure to improve  Orders Placed This Encounter  Procedures  . Clostridium Difficile by PCR    Order Specific Question:  Is your patient experiencing loose or watery stools (3 or more in 24 hours)?    Answer:  Yes    Order Specific Question:  Has the patient received laxatives in the last 24 hours?    Answer:  No    Order Specific Question:  Has a negative Cdiff test resulted in the last 7 days?    Answer:  No  . CBC with Differential  . CMP14+EGFR  . Ambulatory referral to  Gastroenterology    Referral Priority:  Routine    Referral Type:  Consultation    Referral Reason:  Specialty Services Required    Number of Visits Requested:  1    Meds ordered this encounter  Medications  . metroNIDAZOLE (FLAGYL) 500 MG tablet    Sig: Take 1 tablet (500 mg total) by mouth 3 (three) times daily.    Dispense:  30 tablet    Refill:  0    Laroy Apple, MD [facility] 07/18/2015, 12:40 PM

## 2015-07-18 NOTE — Patient Instructions (Addendum)
Great to meet you!  We will call with results as soon as we get them   I recommend weaning off of ativan rather than stopping, a half of a tablet for 2 weeks then 1/2 of that for two weeks is a fast but safer way to quit.   I have sent flagyl to try and treat the infection we believe is causing the diarrhea.

## 2015-07-19 ENCOUNTER — Other Ambulatory Visit: Payer: Medicare Other

## 2015-07-19 ENCOUNTER — Telehealth: Payer: Self-pay | Admitting: Family Medicine

## 2015-07-19 DIAGNOSIS — R197 Diarrhea, unspecified: Secondary | ICD-10-CM | POA: Diagnosis not present

## 2015-07-19 LAB — CMP14+EGFR
ALBUMIN: 3.2 g/dL — AB (ref 3.5–4.7)
ALT: 13 IU/L (ref 0–44)
AST: 13 IU/L (ref 0–40)
Albumin/Globulin Ratio: 1.4 (ref 1.2–2.2)
Alkaline Phosphatase: 66 IU/L (ref 39–117)
BILIRUBIN TOTAL: 0.9 mg/dL (ref 0.0–1.2)
BUN / CREAT RATIO: 23 (ref 10–24)
BUN: 23 mg/dL (ref 8–27)
CHLORIDE: 96 mmol/L (ref 96–106)
CO2: 27 mmol/L (ref 18–29)
Calcium: 8.2 mg/dL — ABNORMAL LOW (ref 8.6–10.2)
Creatinine, Ser: 1 mg/dL (ref 0.76–1.27)
GFR calc non Af Amer: 70 mL/min/{1.73_m2} (ref >59)
GFR, EST AFRICAN AMERICAN: 81 mL/min/{1.73_m2} (ref >59)
GLUCOSE: 195 mg/dL — AB (ref 65–99)
Globulin, Total: 2.3 g/dL (ref 1.5–4.5)
Potassium: 4.4 mmol/L (ref 3.5–5.2)
Sodium: 135 mmol/L (ref 134–144)
TOTAL PROTEIN: 5.5 g/dL — AB (ref 6.0–8.5)

## 2015-07-19 LAB — CBC WITH DIFFERENTIAL/PLATELET
BASOS ABS: 0 10*3/uL (ref 0.0–0.2)
Basos: 0 %
EOS (ABSOLUTE): 0 10*3/uL (ref 0.0–0.4)
Eos: 1 %
HEMATOCRIT: 39.5 % (ref 37.5–51.0)
HEMOGLOBIN: 12.9 g/dL (ref 12.6–17.7)
IMMATURE GRANS (ABS): 0 10*3/uL (ref 0.0–0.1)
Immature Granulocytes: 0 %
LYMPHS ABS: 1.4 10*3/uL (ref 0.7–3.1)
LYMPHS: 25 %
MCH: 31.5 pg (ref 26.6–33.0)
MCHC: 32.7 g/dL (ref 31.5–35.7)
MCV: 96 fL (ref 79–97)
MONOCYTES: 8 %
Monocytes Absolute: 0.5 10*3/uL (ref 0.1–0.9)
NEUTROS ABS: 3.9 10*3/uL (ref 1.4–7.0)
Neutrophils: 66 %
Platelets: 154 10*3/uL (ref 150–379)
RBC: 4.1 x10E6/uL — ABNORMAL LOW (ref 4.14–5.80)
RDW: 18 % — ABNORMAL HIGH (ref 12.3–15.4)
WBC: 5.8 10*3/uL (ref 3.4–10.8)

## 2015-07-20 LAB — CLOSTRIDIUM DIFFICILE BY PCR: CDIFFPCR: POSITIVE — AB

## 2015-07-21 NOTE — Telephone Encounter (Signed)
Referral placed for [facility]

## 2015-07-25 ENCOUNTER — Encounter: Payer: Self-pay | Admitting: Internal Medicine

## 2015-07-27 ENCOUNTER — Ambulatory Visit: Payer: Medicare Other | Admitting: Family Medicine

## 2015-08-01 ENCOUNTER — Telehealth: Payer: Self-pay | Admitting: Family Medicine

## 2015-08-01 MED ORDER — VANCOMYCIN HCL 125 MG PO CAPS: 125.0000 mg | ORAL_CAPSULE | Freq: Four times a day (QID) | ORAL | Status: DC

## 2015-08-01 NOTE — Telephone Encounter (Signed)
Sent oral vancomycin because patient has finished course of Flagyl and is still having C. difficile diarrhea.

## 2015-08-01 NOTE — Telephone Encounter (Addendum)
Patients daughter aware

## 2015-08-12 ENCOUNTER — Other Ambulatory Visit: Payer: Self-pay | Admitting: Family Medicine

## 2015-08-21 ENCOUNTER — Other Ambulatory Visit: Payer: Self-pay | Admitting: Family Medicine

## 2015-08-22 ENCOUNTER — Ambulatory Visit (INDEPENDENT_AMBULATORY_CARE_PROVIDER_SITE_OTHER): Payer: Medicare Other | Admitting: Family Medicine

## 2015-08-22 ENCOUNTER — Encounter: Payer: Self-pay | Admitting: Cardiology

## 2015-08-22 DIAGNOSIS — I4891 Unspecified atrial fibrillation: Secondary | ICD-10-CM

## 2015-08-22 DIAGNOSIS — E119 Type 2 diabetes mellitus without complications: Secondary | ICD-10-CM

## 2015-08-22 DIAGNOSIS — R001 Bradycardia, unspecified: Secondary | ICD-10-CM

## 2015-08-22 DIAGNOSIS — T424X1D Poisoning by benzodiazepines, accidental (unintentional), subsequent encounter: Secondary | ICD-10-CM | POA: Diagnosis not present

## 2015-08-22 NOTE — Progress Notes (Deleted)
Cardiology Office Note   Date:  08/22/2015   ID:  Chad [name], DOB [birth year], MRN [id number]  PCP:  Rudi Heap, MD  Cardiologist:   Rollene Rotunda, MD   No chief complaint on file.     History of Present Illness: Chad [name] is a 82 y.o. male who presents for ***  He has a history of atrial fib.  I have not seen him since 2015.    He previously saw Dr. Alanda Amass.  When he was referred to me previously it was for a diagnosis of new atrial fib.  I started him on Eliquis.  He has missed appts and an echo ordered this year was cancelled.  Dr. Kathi Der recent note indicates that he has depression and cognitive decline and is dealing with his wife being ill.    Past Medical History:  Diagnosis Date  . 1St degree AV block   . Diabetes mellitus without complication (HCC)   . Gout   . Hyperlipidemia   . Hypertension   . Neuropathy (HCC)   . Stroke [facility])     Past Surgical History:  Procedure Laterality Date  . carpel tunnel Bilateral   . CORONARY ARTERY BYPASS GRAFT     heart- x 6  . KIDNEY STONE SURGERY    . PARATHYROIDECTOMY       Current Outpatient Prescriptions  Medication Sig Dispense Refill  . allopurinol (ZYLOPRIM) 100 MG tablet Take 100 mg by mouth daily.    Marland Kitchen atorvastatin (LIPITOR) 20 MG tablet TAKE ONE TABLET BY MOUTH ONCE DAILY AT 6 PM 90 tablet 1  . Cholecalciferol (VITAMIN D3) 2000 units TABS Take 2,000 Units by mouth daily.    . cholestyramine light (PREVALITE) 4 g packet Take 1 packet (4 g total) by mouth 2 (two) times daily. 10 packet 1  . escitalopram (LEXAPRO) 5 MG tablet Take 1 tablet (5 mg total) by mouth daily. 30 tablet 3  . fish oil-omega-3 fatty acids 1000 MG capsule Take 1 g by mouth daily.    . furosemide (LASIX) 40 MG tablet TAKE ONE TABLET BY MOUTH ONCE DAILY 30 tablet 5  . LANTUS SOLOSTAR 100 UNIT/ML Solostar Pen INJECT 40 UNITS SUBCUTANEOUSLY AT BEDTIME 15 mL 1  . lisinopril (PRINIVIL,ZESTRIL) 40 MG tablet TAKE ONE TABLET BY MOUTH  ONCE DAILY 90 tablet 0  . metroNIDAZOLE (FLAGYL) 500 MG tablet Take 1 tablet (500 mg total) by mouth 3 (three) times daily. 30 tablet 0  . ONE TOUCH ULTRA TEST test strip USE ONE STRIP TO CHECK GLUCOSE TWICE DAILY TO THREE TIMES DAILY 100 each 2  . Probiotic Product (PROBIOTIC DAILY PO) Take 1 capsule by mouth daily.    . vancomycin (VANCOCIN HCL) 125 MG capsule Take 1 capsule (125 mg total) by mouth 4 (four) times daily. 40 capsule 0   No current facility-administered medications for this visit.     Allergies:   Review of patient's allergies indicates no known allergies.    ROS:  Please see the history of present illness.   Otherwise, review of systems are positive for {NONE DEFAULTED:18576::"none"}.   All other systems are reviewed and negative.    PHYSICAL EXAM: VS:  There were no vitals taken for this visit. , BMI There is no height or weight on file to calculate BMI. GENERAL:  Well appearing HEENT:  Pupils equal round and reactive, fundi not visualized, oral mucosa unremarkable NECK:  No jugular venous distention, waveform within normal limits, carotid upstroke brisk  and symmetric, no bruits, no thyromegaly LYMPHATICS:  No cervical, inguinal adenopathy LUNGS:  Clear to auscultation bilaterally BACK:  No CVA tenderness CHEST:  Unremarkable HEART:  PMI not displaced or sustained,S1 and S2 within normal limits, no S3, no S4, no clicks, no rubs, *** murmurs ABD:  Flat, positive bowel sounds normal in frequency in pitch, no bruits, no rebound, no guarding, no midline pulsatile mass, no hepatomegaly, no splenomegaly EXT:  2 plus pulses throughout, no edema, no cyanosis no clubbing SKIN:  No rashes no nodules NEURO:  Cranial nerves II through XII grossly intact, motor grossly intact throughout PSYCH:  Cognitively intact, oriented to person place and time    EKG:  EKG {ACTION; IS/IS WUJ:[id number]} ordered today. The ekg ordered today demonstrates ***   Recent Labs: 06/29/2015: BNP  1,261.2; TSH 2.150 07/06/2015: Hemoglobin 12.0 07/18/2015: ALT 13; BUN 23; Creatinine, Ser 1.00; Platelets 154; Potassium 4.4; Sodium 135    Lipid Panel    Component Value Date/Time   CHOL 168 06/29/2015 1229   CHOL 128 06/12/2012 0847   TRIG 82 06/29/2015 1229   TRIG 77 02/23/2015 1642   TRIG 96 06/12/2012 0847   HDL 44 06/29/2015 1229   HDL 46 02/23/2015 1642   HDL 36 (L) 06/12/2012 0847   CHOLHDL 3.8 06/29/2015 1229   LDLCALC 108 (H) 06/29/2015 1229   LDLCALC 42 08/19/2013 0830   LDLCALC 73 06/12/2012 0847      Wt Readings from Last 3 Encounters:  07/18/15 144 lb 12.8 oz (65.7 kg)  07/08/15 155 lb (70.3 kg)  07/07/15 148 lb 13 oz (67.5 kg)      Other studies Reviewed: Additional studies/ records that were reviewed today include: ***. Review of the above records demonstrates:  Please see elsewhere in the note.  ***   ASSESSMENT AND PLAN:  ATRIAL FIB:  With his age and mildly elevated creatinine he will need 2.5 mg twice daily of Eliquis.   I had a long discussion with him about this. He understands the risk benefits of bleeding. I think cardioversion is indicated and he seems to have a slow rate already. He will continue otherwise on the meds as listed. I did review his labs to include his most recent CBC and creatinine. He has no active bleeding.   He will stop his aspirin.   We will make sure he has a recent TSH.  CAD:   I will consider followup stress testing in  The future.  HTN:  The blood pressure is at target. No change in medications is indicated. We will continue with therapeutic lifestyle changes (TLC).    Current medicines are reviewed at length with the patient today.  The patient {ACTIONS; HAS/DOES NOT HAVE:19233} concerns regarding medicines.  The following changes have been made:  {PLAN; NO CHANGE:13088:s}  Labs/ tests ordered today include: *** No orders of the defined types were placed in this encounter.    Disposition:   FU with ***     Signed, Rollene Rotunda, MD  08/22/2015 10:28 PM    [facility]

## 2015-08-24 ENCOUNTER — Telehealth: Payer: Self-pay | Admitting: Family Medicine

## 2015-08-24 ENCOUNTER — Ambulatory Visit: Payer: Medicare Other | Admitting: Cardiology

## 2015-08-24 DIAGNOSIS — R0989 Other specified symptoms and signs involving the circulatory and respiratory systems: Secondary | ICD-10-CM

## 2015-08-24 NOTE — Telephone Encounter (Signed)
Please check and see what antibiotic he is been on. He may need to just see the gastroenterologist sooner rather than later. This would be the best option. See if he can get an urgent appointment with his GI doctor.

## 2015-08-25 NOTE — Telephone Encounter (Signed)
Pt finished Vancomycin that was given here. The appt right now is set for 09/22/15 at 10:30 with GI - Dr Delane Ginger.   I will call GI and see if we can do anything sooner   appt moved up to tomorrow at at 10:30 am

## 2015-08-26 ENCOUNTER — Encounter: Payer: Self-pay | Admitting: Nurse Practitioner

## 2015-08-26 ENCOUNTER — Ambulatory Visit (INDEPENDENT_AMBULATORY_CARE_PROVIDER_SITE_OTHER): Payer: Medicare Other | Admitting: Nurse Practitioner

## 2015-08-26 DIAGNOSIS — B9689 Other specified bacterial agents as the cause of diseases classified elsewhere: Secondary | ICD-10-CM | POA: Diagnosis not present

## 2015-08-26 DIAGNOSIS — A498 Other bacterial infections of unspecified site: Secondary | ICD-10-CM

## 2015-08-26 NOTE — Patient Instructions (Signed)
1. Make sure you're drinking plenty of fluids. 2. Start a probiotic once a day. I will give you samples to last 30 days. 3. Call if you have recurrent symptoms. 4. Return for follow-up in 3 months otherwise.

## 2015-08-26 NOTE — Progress Notes (Signed)
Primary Care Physician:  Rudi Heap, MD Primary Gastroenterologist:  Dr. Jena Gauss  Chief Complaint  Patient presents with  . Diarrhea    since March    HPI:   Chad [name] is a 82 y.o. male who presents On referral from primary care for history of C. difficile colitis and diarrhea any stops antibiotics. Last seen by primary care on 07/18/2015 for diarrhea. At that time CBC, CMP, C. difficile by PCR was ordered and he was started on Flagyl by mouth 3 times a day for 10 days. C. difficile by PCR was positive on 07/19/2015, CBC normal including white blood cell count specifically, kidney and liver function good on CMP. Last abdominal imaging completed 06/05/2015 was a CT of the abdomen and pelvis with contrast which noted mild wall thickening with hazy attenuation adjacent to the pericolonic fat in the transverse colon distally to the rectum and likely mild acute colitis.  Today he states he's doing well. Took antibiotics. Per his daughter he took them bid rather than tid because historically his diarrhea returns when antibiotics finish so he wanted to stretch it out. Just finished a few days ago. Today he's having solid stools. Denies N/V, abdominal pain, hematochezia, melena. Appetite has picked up now. Denies fever, chills. Has lost weight recently due to poor apetite when ill. Denies chest pain, dyspnea, dizziness, lightheadedness, syncope, near syncope. Denies any other upper or lower GI symptoms.  Past Medical History:  Diagnosis Date  . 1St degree AV block   . Atrial fibrillation (HCC)   . Diabetes mellitus without complication (HCC)   . Gout   . Hyperlipidemia   . Hypertension   . Neuropathy (HCC)   . Stroke [facility])     Past Surgical History:  Procedure Laterality Date  . carpel tunnel Bilateral   . CORONARY ARTERY BYPASS GRAFT     heart- x 6  . KIDNEY STONE SURGERY    . PARATHYROIDECTOMY      Current Outpatient Prescriptions  Medication Sig Dispense Refill  .  allopurinol (ZYLOPRIM) 100 MG tablet Take 100 mg by mouth daily.    Marland Kitchen aspirin 81 MG tablet Take 81 mg by mouth daily.    Marland Kitchen atorvastatin (LIPITOR) 20 MG tablet TAKE ONE TABLET BY MOUTH ONCE DAILY AT 6 PM 90 tablet 1  . Cholecalciferol (VITAMIN D3) 2000 units TABS Take 2,000 Units by mouth daily.    Marland Kitchen escitalopram (LEXAPRO) 5 MG tablet Take 1 tablet (5 mg total) by mouth daily. 30 tablet 3  . fish oil-omega-3 fatty acids 1000 MG capsule Take 1 g by mouth daily.    . furosemide (LASIX) 40 MG tablet TAKE ONE TABLET BY MOUTH ONCE DAILY 30 tablet 5  . LANTUS SOLOSTAR 100 UNIT/ML Solostar Pen INJECT 40 UNITS SUBCUTANEOUSLY AT BEDTIME 15 mL 1  . lisinopril (PRINIVIL,ZESTRIL) 40 MG tablet TAKE ONE TABLET BY MOUTH ONCE DAILY 90 tablet 0  . cholestyramine light (PREVALITE) 4 g packet Take 1 packet (4 g total) by mouth 2 (two) times daily. (Patient not taking: Reported on 08/26/2015) 10 packet 1  . metroNIDAZOLE (FLAGYL) 500 MG tablet Take 1 tablet (500 mg total) by mouth 3 (three) times daily. (Patient not taking: Reported on 08/26/2015) 30 tablet 0  . ONE TOUCH ULTRA TEST test strip USE ONE STRIP TO CHECK GLUCOSE TWICE DAILY TO THREE TIMES DAILY 100 each 2  . Probiotic Product (PROBIOTIC DAILY PO) Take 1 capsule by mouth daily.    . vancomycin [facility]  HCL) 125 MG capsule Take 1 capsule (125 mg total) by mouth 4 (four) times daily. (Patient not taking: Reported on 08/26/2015) 40 capsule 0   No current facility-administered medications for this visit.     Allergies as of 08/26/2015  . (No Known Allergies)    Family History  Problem Relation Age of Onset  . Cancer Father     throat  . CAD Brother 59    MI questionable, sudden death  . CAD Daughter 2    MI, stent    Social History   Social History  . Marital status: Married    Spouse name: N/A  . Number of children: 1  . Years of education: N/A   Occupational History  . Not on file.   Social History Main Topics  . Smoking status: Former  Smoker    Types: Cigarettes    Quit date: 06/12/1977  . Smokeless tobacco: Not on file  . Alcohol use No  . Drug use: No  . Sexual activity: Not on file   Other Topics Concern  . Not on file   Social History Narrative  . No narrative on file    Review of Systems: General: Negative for anorexia, weight loss, fever, chills, fatigue, weakness. ENT: Negative for hoarseness, difficulty swallowing. CV: Negative for chest pain, angina, palpitations, dyspnea on exertion, peripheral edema.  Respiratory: Negative for dyspnea at rest, cough, sputum, wheezing.  GI: See history of present illness. Endo: Negative for unusual weight change.  Heme: Negative for bruising or bleeding.    Physical Exam: BP (!) 112/58   Pulse 62   Temp 97 F (36.1 C) (Oral)   Ht  (1.727 m)   Wt 151 lb 9.6 oz (68.8 kg)   BMI 23.05 kg/m  General:   Alert and oriented. Pleasant and cooperative. Well-nourished and well-developed.  Eyes:  Without icterus, sclera clear and conjunctiva pink.  Ears:  Normal auditory acuity. Cardiovascular:  S1, S2 present without murmurs appreciated. Noted bradycardia, possibly irregular but difficult to discern with rate < 60; asymptomatic. Extremities without clubbing or edema. Respiratory:  Clear to auscultation bilaterally. No wheezes, rales, or rhonchi. No distress.  Gastrointestinal:  +BS, soft, non-tender and non-distended. No HSM noted. No guarding or rebound. No masses appreciated.  Rectal:  Deferred  Musculoskalatal:  Symmetrical without gross deformities. Neurologic:  Alert and oriented x4;  grossly normal neurologically. Psych:  Alert and cooperative. Normal mood and affect. Heme/Lymph/Immune: No excessive bruising noted.    09/01/2015 4:19 PM   Disclaimer: This note was dictated with voice recognition software. Similar sounding words can inadvertently be transcribed and may not be corrected upon review.

## 2015-08-26 NOTE — Assessment & Plan Note (Addendum)
Patient with a positive C. difficile infection with classic CT findings earlier this year. His symptoms had at least one recurrence. Most recently saw primary care for diarrhea, weakness, decreased appetite. C. difficile PCR was positive and he was started on Flagyl 3 times a day for 10 days. However, per his daughter he only took it twice a day in order to make it last try to prevent the diarrhea from coming back. This point he is feeling much better, no nausea, vomiting, hematochezia. No other red flag/warning signs or symptoms. Stools are now formed, appetite improved. He is to call us if he has recurrent symptoms. His recurrent diarrhea after treatment could be postinfectious IBS. We'll start him on a probiotic at this point, encourage fluids to maintain hydration. If he does continue to have C. difficile recurrences will need to address further. Return for follow-up in 3 months.

## 2015-08-29 ENCOUNTER — Telehealth: Payer: Self-pay | Admitting: Internal Medicine

## 2015-08-29 NOTE — Telephone Encounter (Signed)
Noted. No further recommendations at this time. 

## 2015-08-29 NOTE — Telephone Encounter (Signed)
Spoke with Efraim KaufmannMelissa (pts daughter) she was wondering when they would notice a difference in the pt since he started taking the probiotics. And when should they become concerned and call us. I spoke with EG- they should give it at least 2-3 weeks with the probiotics, and they should call us if they notice any new symptoms related to the diarrhea and dehydration, such as weakness, dizziness, decreased urine output, etc. Or if he develops a fever, they should let us know. They should call us in a few days if the diarrhea continues. EG may try something like Bentyl. Pt should increase his fluid intake. pts daughter- Efraim KaufmannMelissa is aware of all recommendations.

## 2015-08-29 NOTE — Telephone Encounter (Signed)
[phone]  PLEASE CALL PATIENT DAUGHTER, PATIENT HAS QUESTIONS ABOUT C-DIFF CLEARING UP, HE STARTED WITH ISSUES AGAIN THIS MORNING REGARDING BATHROOM

## 2015-08-31 ENCOUNTER — Ambulatory Visit: Payer: Medicare Other | Admitting: Nurse Practitioner

## 2015-09-01 NOTE — Progress Notes (Signed)
CC'ED TO PCP 

## 2015-09-05 ENCOUNTER — Ambulatory Visit: Payer: Medicare Other | Admitting: Nurse Practitioner

## 2015-09-13 ENCOUNTER — Ambulatory Visit: Payer: Medicare Other | Admitting: Internal Medicine

## 2015-09-15 ENCOUNTER — Other Ambulatory Visit: Payer: Self-pay | Admitting: Family Medicine

## 2015-09-15 DIAGNOSIS — E119 Type 2 diabetes mellitus without complications: Secondary | ICD-10-CM | POA: Diagnosis not present

## 2015-09-22 ENCOUNTER — Ambulatory Visit: Payer: Medicare Other | Admitting: Nurse Practitioner

## 2015-10-24 ENCOUNTER — Ambulatory Visit (INDEPENDENT_AMBULATORY_CARE_PROVIDER_SITE_OTHER): Payer: Medicare Other

## 2015-10-24 DIAGNOSIS — Z23 Encounter for immunization: Secondary | ICD-10-CM | POA: Diagnosis not present

## 2015-11-07 ENCOUNTER — Telehealth: Payer: Self-pay | Admitting: Family Medicine

## 2015-11-09 MED ORDER — PEN NEEDLES 32G X 4 MM MISC: 1.0000 | Freq: Every day | 2 refills | Status: DC

## 2015-11-09 NOTE — Telephone Encounter (Signed)
Looks like patient use lantus solostar so pen needles needed - Rx sent Daughter notified [facility]

## 2015-11-09 NOTE — Telephone Encounter (Signed)
Help.

## 2015-11-17 ENCOUNTER — Other Ambulatory Visit: Payer: Self-pay | Admitting: Family Medicine

## 2015-11-28 ENCOUNTER — Encounter: Payer: Self-pay | Admitting: Nurse Practitioner

## 2015-11-28 ENCOUNTER — Ambulatory Visit (INDEPENDENT_AMBULATORY_CARE_PROVIDER_SITE_OTHER): Payer: Medicare Other | Admitting: Nurse Practitioner

## 2015-11-28 VITALS — BP 129/48 | HR 55 | Temp 98.0°F | Ht 68.0 in | Wt 160.8 lb

## 2015-11-28 DIAGNOSIS — Z8719 Personal history of other diseases of the digestive system: Secondary | ICD-10-CM

## 2015-11-28 DIAGNOSIS — Z09 Encounter for follow-up examination after completed treatment for conditions other than malignant neoplasm: Secondary | ICD-10-CM

## 2015-11-28 DIAGNOSIS — A498 Other bacterial infections of unspecified site: Secondary | ICD-10-CM

## 2015-11-28 NOTE — Patient Instructions (Signed)
1. I'm glad you're feeling better! 2. Call and let us know if you have any new or returning symptoms and we can see you at that point.

## 2015-11-28 NOTE — Progress Notes (Signed)
Referring Provider: Ernestina PennaMoore, Donald W, MD Primary Care Physician:  Rudi HeapMOORE, DONALD, MD Primary GI:  Dr. Jena Gaussourk  Chief Complaint  Patient presents with  . Follow-up    f/u C.diff, no problems now    HPI:   Chad [name] is a 82 y.o. male who presents for follow-up on C. difficile infection. He was last seen in our office 08/26/2015 at which point it was noted he tested positive by C. difficile by PCR and was started on Flagyl. Noted normal CBC, CMP. CT of the abdomen 06/05/2015 with hazy attenuation adjacent to the [facility] colonic fat in the transverse colon and distally to the rectum which is likely mild acute colitis. At his last office visit he says he took his antibiotics and was doing well, just finished a few days ago. Was noted to have solid stools previous to his last visit, some weight loss due to illness. However, his daughter states he did not take his antibiotics at the frequency recommended CT could "make him last." Is having some recurrent diarrhea, likely post infectious IBS. He was started on a probiotic, encouraged fluids. Recommend return for follow-up in 3 months.  Today he states he's doing much better. Denies abdominal pain, N/V. No more diarrhea since taking probiotic. Appetite much improved and back to baseline. Denies hematochezia, melena, fever, chills. Energy level doing great. Denies chest pain, dyspnea, dizziness, lightheadedness, syncope, near syncope. Denies any other upper or lower GI symptoms.  Past Medical History:  Diagnosis Date  . 1st degree AV block   . Atrial fibrillation (HCC)   . Diabetes mellitus without complication (HCC)   . Gout   . Hyperlipidemia   . Hypertension   . Neuropathy (HCC)   . Stroke [facility](HCC)     Past Surgical History:  Procedure Laterality Date  . carpel tunnel Bilateral   . CORONARY ARTERY BYPASS GRAFT     heart- x 6  . KIDNEY STONE SURGERY    . PARATHYROIDECTOMY      Current Outpatient Prescriptions  Medication Sig Dispense  Refill  . allopurinol (ZYLOPRIM) 100 MG tablet Take 100 mg by mouth daily.    Marland Kitchen. aspirin 81 MG tablet Take 81 mg by mouth daily.    Marland Kitchen. atenolol (TENORMIN) 50 MG tablet Take 1 Tablet by mouth once daily 30 tablet 1  . atorvastatin (LIPITOR) 20 MG tablet TAKE ONE TABLET BY MOUTH ONCE DAILY AT 6 PM 90 tablet 1  . Cholecalciferol (VITAMIN D3) 2000 units TABS Take 2,000 Units by mouth daily.    . fish oil-omega-3 fatty acids 1000 MG capsule Take 1 g by mouth daily.    . furosemide (LASIX) 40 MG tablet TAKE ONE TABLET BY MOUTH ONCE DAILY 30 tablet 5  . glucose blood (ONE TOUCH ULTRA TEST) test strip Test up to tid. DX E11.9 100 each 5  . Insulin Pen Needle (PEN NEEDLES) 32G X 4 MM MISC 1 each by Does not apply route daily. Use with pen to inject insulin QD (Dx: E10.65, E10.8 - IDDM, uncontrolled) 100 each 2  . LANTUS SOLOSTAR 100 UNIT/ML Solostar Pen INJECT 40 UNITS SUBCUTANEOUSLY AT BEDTIME 15 mL 1  . lisinopril (PRINIVIL,ZESTRIL) 40 MG tablet TAKE ONE TABLET BY MOUTH ONCE DAILY 90 tablet 0  . Probiotic Product (PROBIOTIC DAILY PO) Take 1 capsule by mouth daily.     No current facility-administered medications for this visit.     Allergies as of 11/28/2015  . (No Known Allergies)  Family History  Problem Relation Age of Onset  . Cancer Father     throat  . CAD Brother 6838    MI questionable, sudden death  . CAD Daughter 5333    MI, stent    Social History   Social History  . Marital status: Married    Spouse name: N/A  . Number of children: 1  . Years of education: N/A   Social History Main Topics  . Smoking status: Former Smoker    Types: Cigarettes    Quit date: 06/12/1977  . Smokeless tobacco: Never Used  . Alcohol use No  . Drug use: No  . Sexual activity: Not Asked   Other Topics Concern  . None   Social History Narrative  . None    Review of Systems: General: Negative for anorexia, weight loss, fever, chills, fatigue, weakness. ENT: Negative for hoarseness,  difficulty swallowing. CV: Negative for chest pain, angina, palpitations, peripheral edema.  Respiratory: Negative for dyspnea at rest, cough, sputum, wheezing.  GI: See history of present illness. Endo: Negative for unusual weight change.  Heme: Negative for bruising or bleeding.   Physical Exam: BP (!) 129/48   Pulse (!) 55   Temp 98 F (36.7 C) (Oral)   Ht 5\' 8"  (1.727 m)   Wt 160 lb 12.8 oz (72.9 kg)   BMI 24.45 kg/m  General:   Alert and oriented. Pleasant and cooperative. Well-nourished and well-developed.  Eyes:  Without icterus, sclera clear and conjunctiva pink.  Ears:  Normal auditory acuity. Cardiovascular:  S1, S2 present without murmurs appreciated. Extremities without clubbing or edema. Respiratory:  Clear to auscultation bilaterally. No wheezes, rales, or rhonchi. No distress.  Gastrointestinal:  +BS, soft, non-tender and non-distended. No HSM noted. No guarding or rebound. No masses appreciated.  Rectal:  Deferred  Musculoskalatal:  Symmetrical without gross deformities. Neurologic:  Alert and oriented x4;  grossly normal neurologically. Psych:  Alert and cooperative. Normal mood and affect. Heme/Lymph/Immune: No excessive bruising noted.    11/28/2015 10:16 AM   Disclaimer: This note was dictated with voice recognition software. Similar sounding words can inadvertently be transcribed and may not be corrected upon review.

## 2015-11-28 NOTE — Assessment & Plan Note (Signed)
He has long since completed his antibiotics. At last visit he was having some intermittent diarrhea, likely postinfectious IBS. He was started on a probiotic. All of his symptoms have resolved. No more diarrhea, his appetite is improved, his energy is improved, he has put weight back on to baseline. Recommend he continue to monitor for any recurrent symptoms. Return for follow-up as needed for any recurrent or new symptoms.

## 2015-11-28 NOTE — Progress Notes (Signed)
CC'ED TO PCP 

## 2015-12-10 ENCOUNTER — Other Ambulatory Visit: Payer: Self-pay | Admitting: Family Medicine

## 2015-12-10 DIAGNOSIS — E119 Type 2 diabetes mellitus without complications: Secondary | ICD-10-CM | POA: Diagnosis not present

## 2015-12-11 ENCOUNTER — Other Ambulatory Visit: Payer: Self-pay | Admitting: Family Medicine

## 2016-01-17 ENCOUNTER — Other Ambulatory Visit: Payer: Self-pay | Admitting: Family Medicine

## 2016-01-17 NOTE — Telephone Encounter (Signed)
Last refill without being seen 

## 2016-02-12 ENCOUNTER — Other Ambulatory Visit: Payer: Self-pay | Admitting: Family Medicine

## 2016-03-10 ENCOUNTER — Other Ambulatory Visit: Payer: Self-pay | Admitting: Family Medicine

## 2016-03-18 ENCOUNTER — Other Ambulatory Visit: Payer: Self-pay | Admitting: Family Medicine

## 2016-04-05 ENCOUNTER — Other Ambulatory Visit: Payer: Self-pay | Admitting: Nurse Practitioner

## 2016-04-05 DIAGNOSIS — E119 Type 2 diabetes mellitus without complications: Secondary | ICD-10-CM | POA: Diagnosis not present

## 2016-05-18 ENCOUNTER — Other Ambulatory Visit: Payer: Self-pay | Admitting: Family Medicine

## 2016-05-18 NOTE — Telephone Encounter (Signed)
lipitor refill denied- NTBS - no labs since June 2017

## 2016-05-21 NOTE — Telephone Encounter (Signed)
NA / NVM 

## 2016-05-22 NOTE — Telephone Encounter (Signed)
NA / NVM 

## 2016-05-23 ENCOUNTER — Other Ambulatory Visit: Payer: Self-pay | Admitting: Family Medicine

## 2016-06-08 ENCOUNTER — Other Ambulatory Visit: Payer: Self-pay | Admitting: Family Medicine

## 2016-06-15 DIAGNOSIS — E119 Type 2 diabetes mellitus without complications: Secondary | ICD-10-CM | POA: Diagnosis not present

## 2016-07-19 ENCOUNTER — Telehealth: Payer: Self-pay | Admitting: Family Medicine

## 2016-08-08 DIAGNOSIS — Z87442 Personal history of urinary calculi: Secondary | ICD-10-CM | POA: Diagnosis not present

## 2016-08-08 DIAGNOSIS — R800 Isolated proteinuria: Secondary | ICD-10-CM | POA: Diagnosis not present

## 2016-08-09 ENCOUNTER — Other Ambulatory Visit: Payer: Self-pay | Admitting: Family Medicine

## 2016-08-10 NOTE — Telephone Encounter (Signed)
Last lipid 06/29/15  Dr Christell ConstantMoore

## 2016-09-04 IMAGING — MR MR HEAD W/O CM
6 of 10 series · 21 of 48 positions shown · non-contrast
Comparison: CT head without contrast 08/13/2009

CLINICAL DATA: Syncopal episode today with weakness. Personal
history of stroke.

EXAM:
MRI HEAD WITHOUT CONTRAST
TECHNIQUE: Multiplanar, multiecho pulse sequences of the brain and surrounding
structures were obtained without intravenous contrast.

[Series 5: T1 · sagittal · 5.0mm · 0.41mm/px · 3 of 21 slices shown (1 of 2)]
[im 1/21]
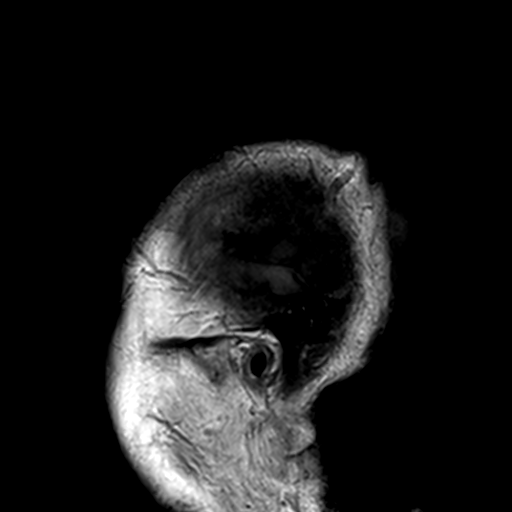
[im 11/21]
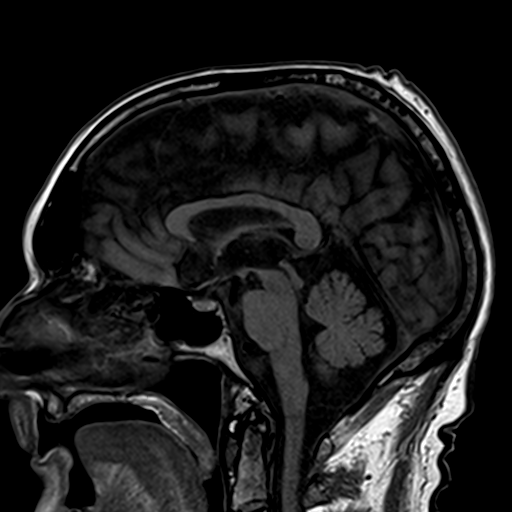
[im 21/21]
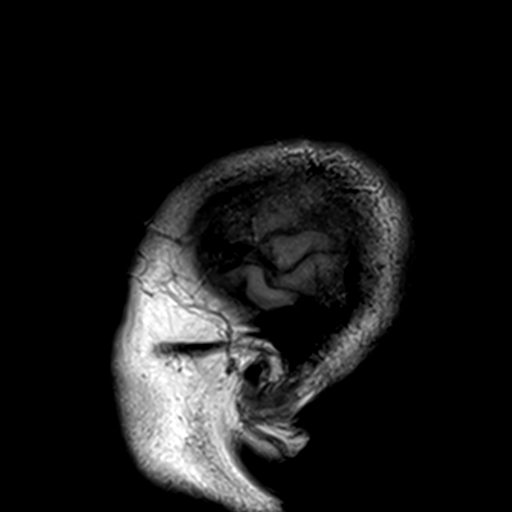

[Series 6: T2 · axial · 5.0mm · 0.49mm/px · z∈[-27,+110]mm · 3 of 23 slices shown (1 of 2)]
[im 1/23]
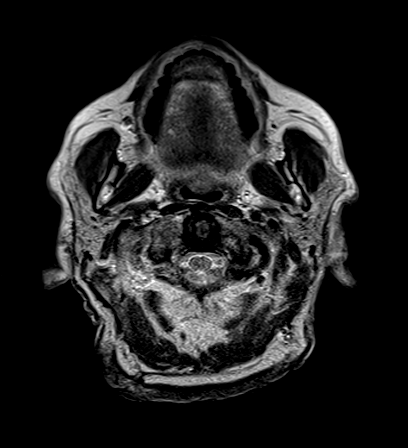
[im 12/23]
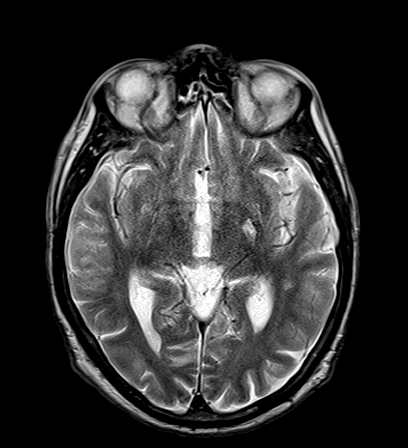
[im 23/23]
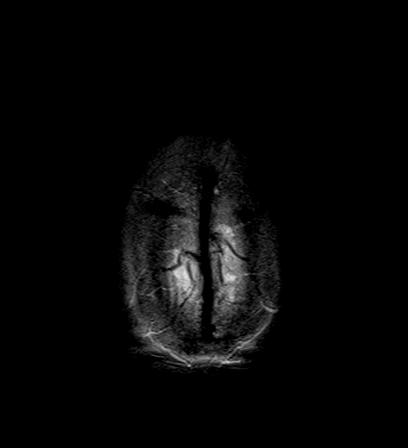

[Series 7: FLAIR · axial · 5.0mm · 0.34mm/px · z∈[-28,+109]mm · 3 of 23 slices shown]
[im 1/23]
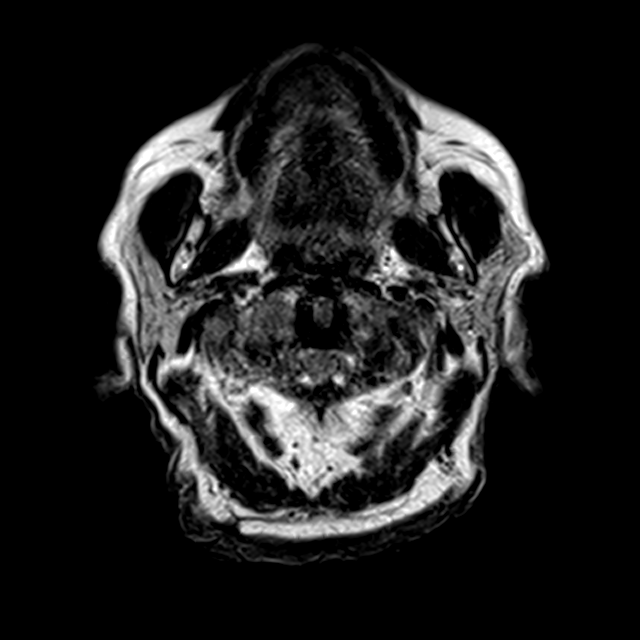
[im 12/23]
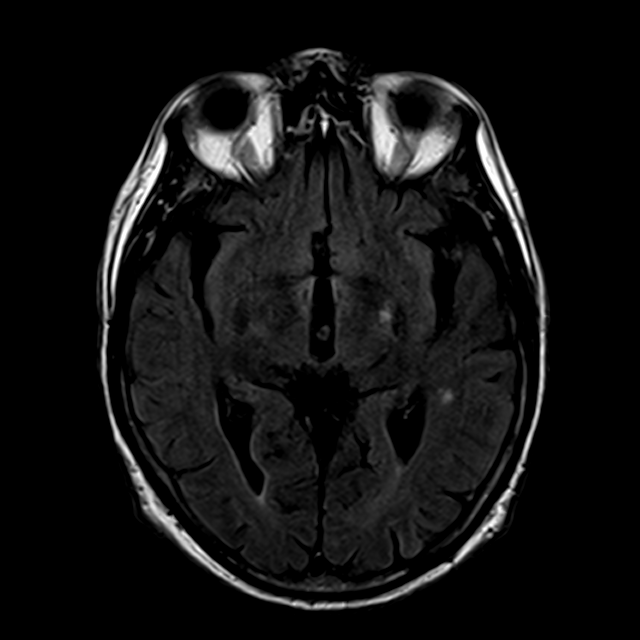
[im 23/23]
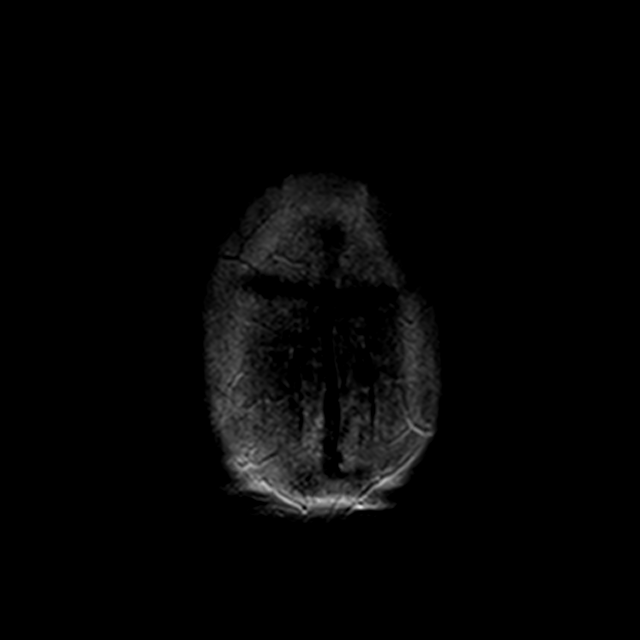

[Series 8: T1 · axial · 2.0mm · 0.42mm/px · z∈[-28,+112]mm · 8 of 74 slices shown (2 of 2)]
[im 1/74]
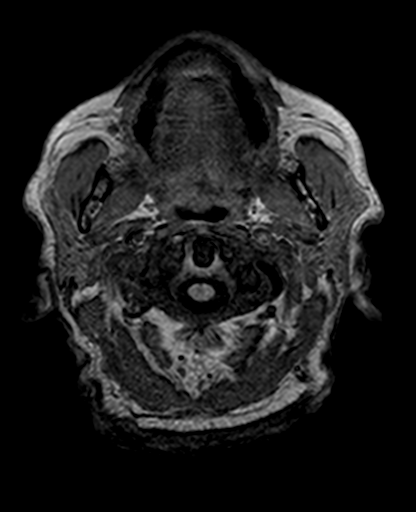
[im 10/74]
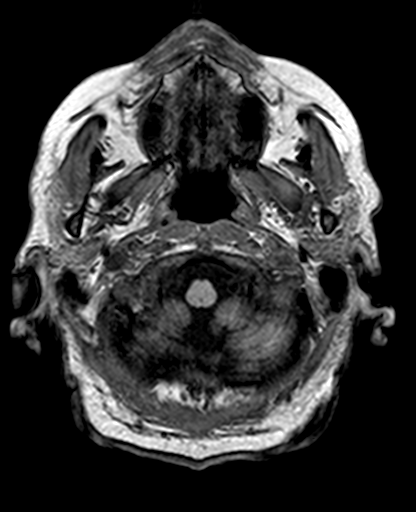
[im 19/74]
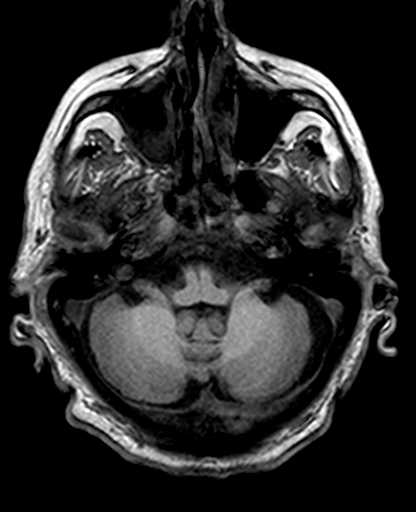
[im 28/74]
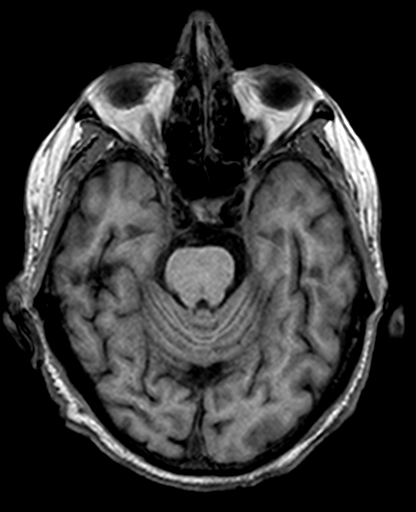
[im 46/74]
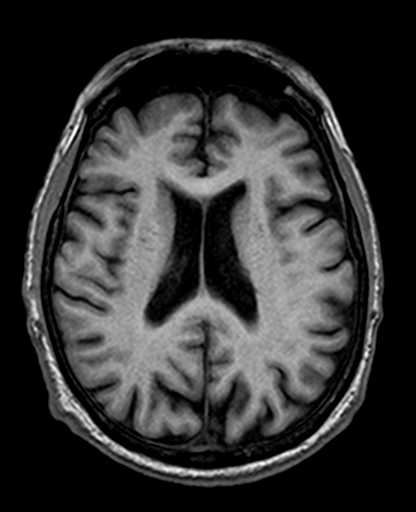
[im 55/74]
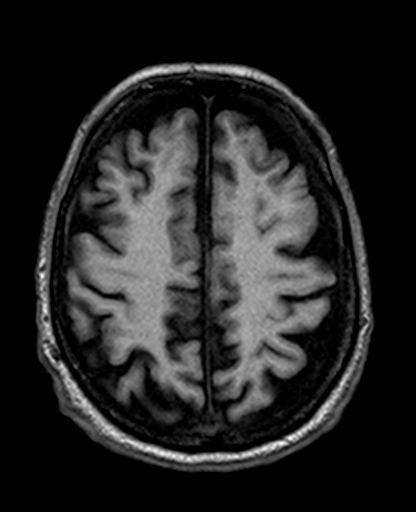
[im 64/74]
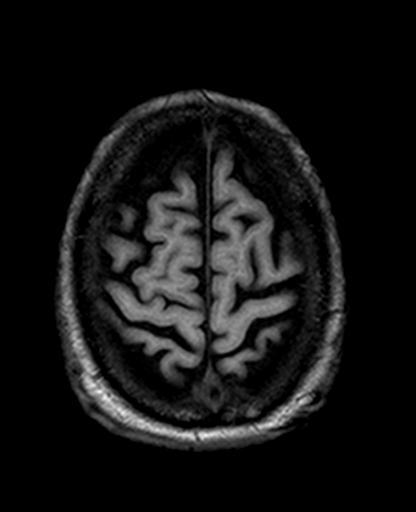
[im 74/74]
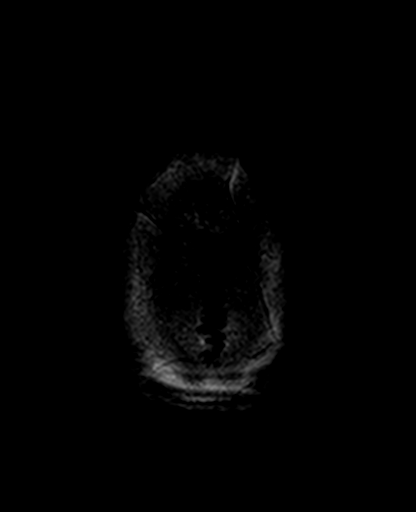

[Series 9: trauma axial · axial · 5.0mm · 0.43mm/px · 1 of 23 slices shown]
[im 1/23]
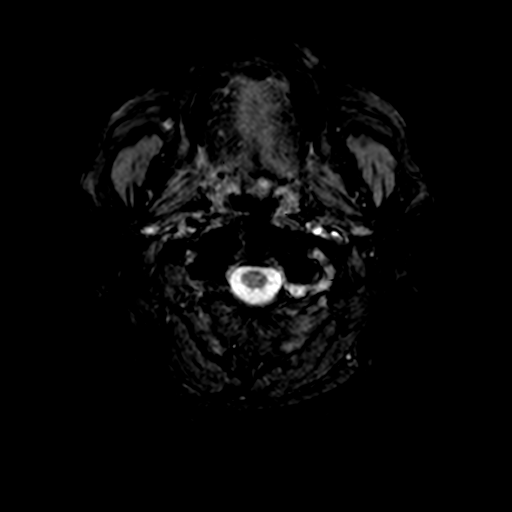

[Series 10: T2 · coronal · 5.0mm · 0.43mm/px · 3 of 28 slices shown (2 of 2)]
[im 1/28]
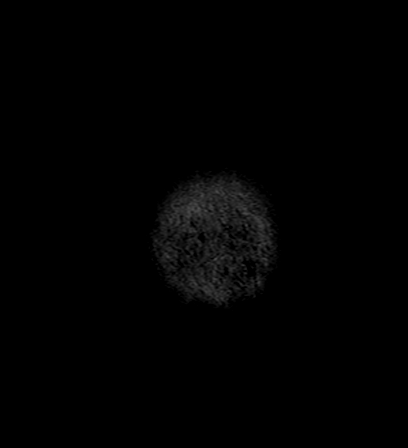
[im 14/28]
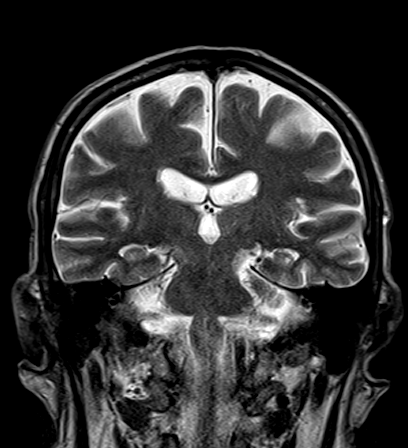
[im 28/28]
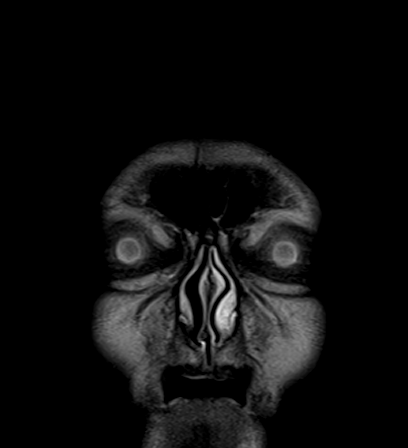

[21 of 48 positions shown; findings below may reference images not displayed]

FINDINGS: The diffusion-weighted images demonstrate no evidence for acute or
subacute infarction. No acute hemorrhage or mass lesion is present.
Mild atrophy and white matter changes are within normal limits for
age.

The internal auditory canals are within normal limits bilaterally.
The brainstem and cerebellum are unremarkable. The left vertebral
artery is dominant. Flow is present in the major intracranial
arteries.

Globes and orbits are intact. A fluid level is present in the right
maxillary sinus. Anterior ethmoid mucosal thickening is noted
bilaterally. The left maxillary sinus bilateral frontal sinuses are
clear. The sphenoid sinuses are clear. The mastoid air cells are
clear.

Skullbase is within normal limits. Midline sagittal images are
unremarkable.
IMPRESSION: 1. Normal MRI appearance the brain for age.
2. Acute on chronic right maxillary sinusitis.

## 2016-09-07 ENCOUNTER — Other Ambulatory Visit: Payer: Self-pay | Admitting: Family Medicine

## 2016-09-14 ENCOUNTER — Other Ambulatory Visit: Payer: Self-pay | Admitting: Family Medicine

## 2016-09-26 ENCOUNTER — Other Ambulatory Visit: Payer: Self-pay | Admitting: Family Medicine

## 2016-09-26 ENCOUNTER — Other Ambulatory Visit: Payer: Self-pay | Admitting: Family

## 2016-09-28 ENCOUNTER — Other Ambulatory Visit: Payer: Self-pay | Admitting: Family Medicine

## 2016-09-28 DIAGNOSIS — E119 Type 2 diabetes mellitus without complications: Secondary | ICD-10-CM | POA: Diagnosis not present

## 2016-09-28 MED ORDER — ATORVASTATIN CALCIUM 20 MG PO TABS: ORAL_TABLET | ORAL | 5 refills | Status: DC

## 2016-09-28 NOTE — Telephone Encounter (Signed)
Last lipid 06/29/15  [facility]

## 2016-09-28 NOTE — Telephone Encounter (Signed)
Med sent to [facility]  

## 2016-10-15 NOTE — Progress Notes (Signed)
Subjective: CC: HTn, T2DM PCP: Chipper Herb, MD XTK:[id number] Chad [name] is a 83 y.o. male, who is accompanied by his daughter to clinic today. He is presenting to clinic today for:  Type 2 Diabetes:  Patient reports: Glucometer:One touch meter which is 81 years old, High at home: 300; Low at home: 60, Taking medication(s): Lantus 10u-18u QHS, patient notes that he self titrated down to 10 units because of having up to 3 episodes of hypoglycemia per week. He describes symptoms of hypoglycemia as shakiness. He notes that during these times, he will eat something sugary to bring his sugar back up. No loss of consciousness, no falls. He does report intermittent dizziness upon standing. No history of foot ulcerations. Patient is able to see the bottom of his feet and checks these regularly. He does report intermittent numbness and burning in his feet. Patient maintains a low carb, high protein diet. He does not drink sugary beverages or alcohol.  Last eye exam: 1 year ago at [facility] Last foot exam: >1 year Last A1c: 06/2015; 6.2. Nephropathy screen indicated?: on ACE-I Last flu, zoster and/or pneumovax: Flu and PNA (2nd in series)   Hypertension/ Atherosclerosis Meds: Compliant with Lisinopril 64m, Atenolol 50, Lipitor 20. ROS: Reports dizziness. He has baseline LE edema in the LLE, this is the leg that his vessels were removed from for cardiac procedure. Denies headache, visual changes, nausea, vomiting, chest pain, abdominal pain or shortness of breath.   No Known Allergies Past Medical History:  Diagnosis Date  . 1st degree AV block   . Atrial fibrillation ([place]   . Diabetes mellitus without complication ([place]   . Gout   . Hyperlipidemia   . Hypertension   . Neuropathy   . Stroke ([facility]    Family History  Problem Relation Age of Onset  . Cancer Father        throat  . CAD Brother 367      MI questionable, sudden death  . CAD Daughter 365      MI, stent   Social Hx: non  smoker.Current medications reviewed.    Health Maintenance: PNA and Flu vaccines   ROS: Per HPI  Objective: Office vital signs reviewed. BP 100/60   Pulse 64   Temp (!) 97.4 F (36.3 C) (Oral)   Ht 5' 8" (1.727 m)   Wt 156 lb (70.8 kg)   BMI 23.72 kg/m   Physical Examination:  General: Awake, alert, well nourished, well appearing male, No acute distress HEENT: Normal, MMM, wears glasses Cardio: bradycardic and regular rhythm, S1S2 heard, no murmurs appreciated Pulm: clear to auscultation bilaterally, no wheezes, rhonchi or rales; normal work of breathing on room air Extremities: cool, 1+ pitting edema RLE and trace edema in the LLE, no cyanosis or clubbing; +1 pedal pulses bilaterally MSK: normal gait and normal station Skin: dry; small healing excoriation along the dorsum of the left foot. No surrounding erythema.  No induration.  No bleeding or exudate. Neuro: see DM foot exam below.  Diabetic Foot Form - Detailed   Diabetic Foot Exam - detailed Diabetic Foot exam was performed with the following findings:  Yes 10/16/2016 10:08 AM  Visual Foot Exam completed.:  Yes  Can the patient see the bottom of their feet?:  Yes Are the shoes appropriate in style and fit?:  Yes Is there swelling or and abnormal foot shape?:  Yes Is there a claw toe deformity?:  No Is there elevated skin temparature?:  No Is there foot or ankle muscle weakness?:  No Normal Range of Motion:  Yes Pulse Foot Exam completed.:  Yes  Right posterior Tibialias:  Diminished Left posterior Tibialias:  Diminished  Right Dorsalis Pedis:  Diminished Left Dorsalis Pedis:  Diminished  Semmes-Weinstein Monofilament Test R Site 1-Great Toe:  Pos L Site 1-Great Toe:  Pos        Orthostatic VS for the past 24 hrs:  BP- Lying Pulse- Lying BP- Sitting Pulse- Sitting BP- Standing at 0 minutes  10/16/16 1005 102/58 70 100/60 64 110/64   No results found for this or any previous visit (from the past 24  hour(s)).  Assessment/ Plan: 83 y.o. male   ASCVD (arteriosclerotic cardiovascular disease) CMP, lipid panel ordered today. I've reduced his lisinopril to 20 mg daily. Continue atenolol and Lipitor daily. Follow up with PCP in 2 weeks as scheduled.  HTN (hypertension) Blood pressure low today. Orthostatic vital signs were obtained for complaints of intermittent dizziness and were negative. I think his overall low side. For his age, blood pressure goal is less than 150/90. Discontinuation of atenolol was considered.  Given his history of atrial fibrillation and arteriosclerotic cardiovascular disease, patient should be on some type of beta blocker. Because he has been stable on atenolol, I chose not to discontinue this medication. Instead, I have decreased lisinopril to 20 mg daily. He will take half of his 40 mg tablet he has at home.  Additionally, CMP, lipid panel was ordered today. He will follow-up with his primary care provider in about 2 weeks as scheduled.  Insulin dependent diabetes mellitus ([place]) Patient having several intermittent episodes of hypoglycemia per week. His A1c today was 7.6. I think this is appropriate. I discussed with the patient and his daughter that current guidelines recommend a target hemoglobin A1c < 8, as this is safer in geriatric individuals with multiple comorbidities. For this reason, patient to continue decreased Lantus at 10 units every night at bedtime. A new glucometer, lancets and testing strips were sent to his pharmacy. He will check fasting blood glucoses each morning and a postprandial glucoses each evening. I did recommend that should he have signs or symptoms of hypo-or hyperglycemia to also check blood sugar during that time. He will keep a sugar log and bring this with him to his next visit with his primary care provider.  His diabetic foot exam was also performed today. He did have mild neuropathy. See note for details. Follow-up with Dr. Laurance Flatten as  scheduled in 2 weeks.  Screening for deficiency anemia - CBC  Orders Placed This Encounter  Procedures  . Lipid Panel  . CBC  . CMP14+EGFR  . Bayer DCA Hb A1c Waived   Meds ordered this encounter  Medications  . lisinopril (PRINIVIL,ZESTRIL) 40 MG tablet    Sig: Take 0.5 tablets (20 mg total) by mouth daily.    Dispense:  90 tablet    Refill:  0  . Insulin Glargine (LANTUS SOLOSTAR) 100 UNIT/ML Solostar Pen    Sig: Inject 10 Units into the skin daily at 10 pm.    Dispense:  3 mL    Refill:  3    Please consider 90 day supplies to promote better adherence   Patient will need a second pneumonia vaccine and influenza vaccine at next visit. Will contact [place] to obtain most recent diabetic retinal exam.   Janora Norlander, [place] [phone]

## 2016-10-16 ENCOUNTER — Other Ambulatory Visit: Payer: Self-pay | Admitting: *Deleted

## 2016-10-16 ENCOUNTER — Encounter: Payer: Self-pay | Admitting: Family Medicine

## 2016-10-16 ENCOUNTER — Ambulatory Visit (INDEPENDENT_AMBULATORY_CARE_PROVIDER_SITE_OTHER): Payer: Medicare Other | Admitting: Family Medicine

## 2016-10-16 VITALS — BP 100/60 | HR 64 | Temp 97.4°F | Ht 68.0 in | Wt 156.0 lb

## 2016-10-16 DIAGNOSIS — I2510 Atherosclerotic heart disease of native coronary artery without angina pectoris: Secondary | ICD-10-CM

## 2016-10-16 DIAGNOSIS — Z130 Encounter for screening for diseases of the blood and blood-forming organs and certain disorders involving the immune mechanism: Secondary | ICD-10-CM

## 2016-10-16 DIAGNOSIS — IMO0001 Reserved for inherently not codable concepts without codable children: Secondary | ICD-10-CM

## 2016-10-16 DIAGNOSIS — Z794 Long term (current) use of insulin: Secondary | ICD-10-CM

## 2016-10-16 DIAGNOSIS — I10 Essential (primary) hypertension: Secondary | ICD-10-CM

## 2016-10-16 DIAGNOSIS — E119 Type 2 diabetes mellitus without complications: Secondary | ICD-10-CM

## 2016-10-16 LAB — BAYER DCA HB A1C WAIVED: HB A1C (BAYER DCA - WAIVED): 7.6 % — ABNORMAL HIGH (ref <7.0)

## 2016-10-16 MED ORDER — LISINOPRIL 40 MG PO TABS: 20.0000 mg | ORAL_TABLET | Freq: Every day | ORAL | 0 refills | Status: DC

## 2016-10-16 MED ORDER — INSULIN GLARGINE 100 UNIT/ML SOLOSTAR PEN: 10.0000 [IU] | PEN_INJECTOR | Freq: Every day | SUBCUTANEOUS | 3 refills | Status: DC

## 2016-10-16 MED ORDER — ONETOUCH ULTRA 2 W/DEVICE KIT: PACK | 0 refills | Status: DC

## 2016-10-16 MED ORDER — ONETOUCH ULTRASOFT LANCETS MISC: 5 refills | Status: AC

## 2016-10-16 MED ORDER — GLUCOSE BLOOD VI STRP: ORAL_STRIP | 5 refills | Status: DC

## 2016-10-16 NOTE — Assessment & Plan Note (Addendum)
Patient having several intermittent episodes of hypoglycemia per week. His A1c today was 7.6. I think this is appropriate. I discussed with the patient and his daughter that current guidelines recommend a target hemoglobin A1c < 8, as this is safer in geriatric individuals with multiple comorbidities. For this reason, patient to continue decreased Lantus at 10 units every night at bedtime. A new glucometer, lancets and testing strips were sent to his pharmacy. He will check fasting blood glucoses each morning and a postprandial glucoses each evening. I did recommend that should he have signs or symptoms of hypo-or hyperglycemia to also check blood sugar during that time. He will keep a sugar log and bring this with him to his next visit with his primary care provider.  His diabetic foot exam was also performed today. He did have mild neuropathy. See note for details. Follow-up with Dr. Christell Constant as scheduled in 2 weeks.

## 2016-10-16 NOTE — Assessment & Plan Note (Signed)
Blood pressure low today. Orthostatic vital signs were obtained for complaints of intermittent dizziness and were negative. I think his overall low side. For his age, blood pressure goal is less than 150/90. Discontinuation of atenolol was considered.  Given his history of atrial fibrillation and arteriosclerotic cardiovascular disease, patient should be on some type of beta blocker. Because he has been stable on atenolol, I chose not to discontinue this medication. Instead, I have decreased lisinopril to 20 mg daily. He will take half of his 40 mg tablet he has at home.  Additionally, CMP, lipid panel was ordered today. He will follow-up with his primary care provider in about 2 weeks as scheduled.

## 2016-10-16 NOTE — Patient Instructions (Signed)
Your physical exam showed decreased sensation. This is likely secondary to your diabetes. I ordered a sick labs in preparation for a visit with Dr. Christell Constant in a couple of weeks. Your goal hemoglobin A1c is 8. I have decreased your lisinopril to 20 mg daily. This should help with the dizziness. I want you to monitor your blood sugars twice a day until you see Dr. Christell Constant. Make sure that you are getting at least 1 blood sugar measured in the morning prior to eating anything.

## 2016-10-16 NOTE — Assessment & Plan Note (Signed)
CMP, lipid panel ordered today. I've reduced his lisinopril to 20 mg daily. Continue atenolol and Lipitor daily. Follow up with PCP in 2 weeks as scheduled.

## 2016-10-17 ENCOUNTER — Other Ambulatory Visit: Payer: Self-pay | Admitting: *Deleted

## 2016-10-17 DIAGNOSIS — E118 Type 2 diabetes mellitus with unspecified complications: Secondary | ICD-10-CM | POA: Diagnosis not present

## 2016-10-17 LAB — CMP14+EGFR
ALT: 15 IU/L (ref 0–44)
AST: 19 IU/L (ref 0–40)
Albumin/Globulin Ratio: 1.5 (ref 1.2–2.2)
Albumin: 3.8 g/dL (ref 3.5–4.7)
Alkaline Phosphatase: 93 IU/L (ref 39–117)
BUN/Creatinine Ratio: 25 — ABNORMAL HIGH (ref 10–24)
BUN: 28 mg/dL — ABNORMAL HIGH (ref 8–27)
Bilirubin Total: 0.8 mg/dL (ref 0.0–1.2)
CO2: 26 mmol/L (ref 20–29)
Calcium: 8.8 mg/dL (ref 8.6–10.2)
Chloride: 101 mmol/L (ref 96–106)
Creatinine, Ser: 1.1 mg/dL (ref 0.76–1.27)
GFR calc Af Amer: 71 mL/min/{1.73_m2} (ref >59)
GFR calc non Af Amer: 62 mL/min/{1.73_m2} (ref >59)
Globulin, Total: 2.6 g/dL (ref 1.5–4.5)
Glucose: 116 mg/dL — ABNORMAL HIGH (ref 65–99)
Potassium: 4.2 mmol/L (ref 3.5–5.2)
Sodium: 141 mmol/L (ref 134–144)
Total Protein: 6.4 g/dL (ref 6.0–8.5)

## 2016-10-17 LAB — CBC
HEMOGLOBIN: 13.3 g/dL (ref 13.0–17.7)
Hematocrit: 38.7 % (ref 37.5–51.0)
MCH: 32.5 pg (ref 26.6–33.0)
MCHC: 34.4 g/dL (ref 31.5–35.7)
MCV: 95 fL (ref 79–97)
Platelets: 138 10*3/uL — ABNORMAL LOW (ref 150–379)
RBC: 4.09 x10E6/uL — AB (ref 4.14–5.80)
RDW: 15.8 % — ABNORMAL HIGH (ref 12.3–15.4)
WBC: 7.1 10*3/uL (ref 3.4–10.8)

## 2016-10-17 LAB — LIPID PANEL
Chol/HDL Ratio: 2.9 ratio (ref 0.0–5.0)
Cholesterol, Total: 137 mg/dL (ref 100–199)
HDL: 48 mg/dL (ref >39)
LDL Calculated: 78 mg/dL (ref 0–99)
Triglycerides: 53 mg/dL (ref 0–149)
VLDL Cholesterol Cal: 11 mg/dL (ref 5–40)

## 2016-10-17 MED ORDER — ONETOUCH ULTRA 2 W/DEVICE KIT: PACK | 0 refills | Status: DC

## 2016-10-17 NOTE — Progress Notes (Signed)
Dr Reece Agar not able to sign for glucometer due to credentialing Order changed to Dr Christell Constant

## 2016-10-27 DIAGNOSIS — E118 Type 2 diabetes mellitus with unspecified complications: Secondary | ICD-10-CM | POA: Diagnosis not present

## 2016-11-08 ENCOUNTER — Ambulatory Visit (INDEPENDENT_AMBULATORY_CARE_PROVIDER_SITE_OTHER): Payer: Medicare Other | Admitting: Family Medicine

## 2016-11-08 ENCOUNTER — Encounter: Payer: Self-pay | Admitting: Family Medicine

## 2016-11-08 ENCOUNTER — Ambulatory Visit (INDEPENDENT_AMBULATORY_CARE_PROVIDER_SITE_OTHER): Payer: Medicare Other

## 2016-11-08 VITALS — BP 142/60 | HR 56 | Temp 96.9°F | Ht 68.0 in | Wt 156.0 lb

## 2016-11-08 DIAGNOSIS — E785 Hyperlipidemia, unspecified: Secondary | ICD-10-CM

## 2016-11-08 DIAGNOSIS — I4819 Other persistent atrial fibrillation: Secondary | ICD-10-CM

## 2016-11-08 DIAGNOSIS — I10 Essential (primary) hypertension: Secondary | ICD-10-CM

## 2016-11-08 DIAGNOSIS — D692 Other nonthrombocytopenic purpura: Secondary | ICD-10-CM | POA: Diagnosis not present

## 2016-11-08 DIAGNOSIS — R4181 Age-related cognitive decline: Secondary | ICD-10-CM

## 2016-11-08 DIAGNOSIS — F32A Depression, unspecified: Secondary | ICD-10-CM

## 2016-11-08 DIAGNOSIS — I700 Atherosclerosis of aorta: Secondary | ICD-10-CM | POA: Diagnosis not present

## 2016-11-08 DIAGNOSIS — F329 Major depressive disorder, single episode, unspecified: Secondary | ICD-10-CM | POA: Diagnosis not present

## 2016-11-08 DIAGNOSIS — I2510 Atherosclerotic heart disease of native coronary artery without angina pectoris: Secondary | ICD-10-CM

## 2016-11-08 DIAGNOSIS — I481 Persistent atrial fibrillation: Secondary | ICD-10-CM

## 2016-11-08 DIAGNOSIS — E1169 Type 2 diabetes mellitus with other specified complication: Secondary | ICD-10-CM

## 2016-11-08 DIAGNOSIS — D696 Thrombocytopenia, unspecified: Secondary | ICD-10-CM

## 2016-11-08 MED ORDER — AZITHROMYCIN 250 MG PO TABS: ORAL_TABLET | ORAL | 0 refills | Status: DC

## 2016-11-08 NOTE — Addendum Note (Signed)
Addended by: Magdalene RiverBULLINS, Joyous Gleghorn H on: 11/08/2016 03:56 PM   Modules accepted: Orders

## 2016-11-08 NOTE — Progress Notes (Signed)
Subjective:    Patient ID: Chad [name], male    DOB: 1931-08-12, 83 y.o.   MRN: [id number]  HPI Patient here today for follow up on blood sugar. He was seen here recently by DR Danice Goltz and all labs were done at that time.  Patient saw 1 of our other providers on 25 September and at that time his hemoglobin A1c was 7.6%.  The family has been getting low blood pressure readings at home most of the time and we have been getting readings here that are higher.  The nurse got his blood pressure reading twice manually today and was 170/60.  His blood sugar readings at home are not consistent but they are running about 90 fasting 160 8 in the afternoon and as high as 295 on 1 day.  The patient does complain of some dizzy spells at times.  His recent blood work was reviewed before going into the office.  He comes to the visit today with his daughter.  The recent lipid liver panel had an LDL see cholesterol that was 78.  Triglycerides were 53.  The CBC had a normal white blood cell count with a good hemoglobin at 13.3 and a platelet count that was also slightly decreased.  The blood sugar 3 weeks ago was 116 with a normal creatinine normal electrolytes and normal liver function test.  These will be reviewed with the patient he will be given a copy of this report.  The patient is pleasant and alert and is a noncomplainer.  He does not see the cardiologist regularly.  He has had bypass surgery.  It was recommended in the past that he go on a blood thinner but he did not want to do this and is only taking aspirin.   Patient Active Problem List   Diagnosis Date Noted  . Clostridium difficile infection 08/26/2015  . Depression 07/08/2015  . Age-related cognitive decline 07/08/2015  . Gait instability 07/08/2015  . Insulin dependent diabetes mellitus ([place])   . Syncope 07/06/2015  . Anxiety 07/06/2015  . Altered mental state 07/06/2015  . Bradycardia with 41 - 50 beats per minute 07/06/2015  .  Hyperuricemia 02/23/2015  . Thoracic aorta atherosclerosis ([place]) 02/23/2015  . Senile purpura ([place]) 02/23/2015  . Thrombocytopenia ([place]) 05/04/2014  . Long term (current) use of anticoagulants 08/20/2013  . Atrial fibrillation, unspecified 08/19/2013  . BPH (benign prostatic hyperplasia) 04/13/2013  . ASCVD (arteriosclerotic cardiovascular disease) 11/13/2012  . Obstructive sleep apnea 11/13/2012  . HTN (hypertension) 06/12/2012  . Hyperlipidemia LDL goal <70 06/12/2012  . Diabetes mellitus, insulin dependent (IDDM), uncontrolled ([place]) 06/12/2012   Outpatient Encounter Prescriptions as of 11/08/2016  Medication Sig  . allopurinol (ZYLOPRIM) 100 MG tablet Take 100 mg by mouth daily.  Marland Kitchen aspirin 81 MG tablet Take 81 mg by mouth daily.  Marland Kitchen atenolol (TENORMIN) 50 MG tablet TAKE ONE TABLET BY MOUTH ONCE DAILY  . atorvastatin (LIPITOR) 20 MG tablet TAKE 1 TABLET BY MOUTH ONCE DAILY AT 6 PM  . Blood Glucose Monitoring Suppl (ONE TOUCH ULTRA 2) w/Device KIT Use to check BS tid DX E11.8  . Cholecalciferol (VITAMIN D3) 2000 units TABS Take 2,000 Units by mouth daily.  . fish oil-omega-3 fatty acids 1000 MG capsule Take 1 g by mouth daily.  . furosemide (LASIX) 40 MG tablet TAKE 1 TABLET BY MOUTH ONCE DAILY  . glucose blood (ONE TOUCH ULTRA TEST) test strip Test Tid. Dx E11.9  . Insulin Glargine (LANTUS SOLOSTAR) 100 UNIT/ML  Solostar Pen Inject 10 Units into the skin daily at 10 pm.  . Insulin Pen Needle (PEN NEEDLES) 32G X 4 MM MISC 1 each by Does not apply route daily. Use with pen to inject insulin QD (Dx: E10.65, E10.8 - IDDM, uncontrolled)  . Lancets (ONETOUCH ULTRASOFT) lancets Test TId. DX E11.9  . lisinopril (PRINIVIL,ZESTRIL) 40 MG tablet Take 0.5 tablets (20 mg total) by mouth daily.  . Probiotic Product (PROBIOTIC DAILY PO) Take 1 capsule by mouth daily.   No facility-administered encounter medications on file as of 11/08/2016.       Review of Systems  Constitutional: Negative.     HENT: Negative.   Eyes: Negative.   Respiratory: Negative.   Cardiovascular: Negative.   Gastrointestinal: Negative.   Endocrine: Negative.   Genitourinary: Negative.   Musculoskeletal: Negative.   Skin: Negative.   Allergic/Immunologic: Negative.   Neurological: Positive for dizziness.  Hematological: Negative.   Psychiatric/Behavioral: Negative.        Objective:   Physical Exam  Constitutional: He is oriented to person, place, and time. He appears well-developed and well-nourished. No distress.  Elderly but alert and in good spirits  HENT:  Head: Normocephalic and atraumatic.  Right Ear: External ear normal.  Left Ear: External ear normal.  Nose: Nose normal.  Mouth/Throat: Oropharynx is clear and moist. No oropharyngeal exudate.  Eyes: Pupils are equal, round, and reactive to light. Conjunctivae and EOM are normal. Right eye exhibits no discharge. Left eye exhibits no discharge. No scleral icterus.  Neck: Normal range of motion. Neck supple. No thyromegaly present.  No bruits thyromegaly or anterior cervical adenopathy  Cardiovascular: Normal rate, regular rhythm, normal heart sounds and intact distal pulses.   No murmur heard. Heart is irregular irregular at 52-60/min  Pulmonary/Chest: Effort normal. No respiratory distress. He has no wheezes. He has no rales. He exhibits no tenderness.  Decreased breath sounds right base posteriorly  Abdominal: Soft. Bowel sounds are normal. He exhibits no mass. There is no tenderness. There is no rebound and no guarding.  No abdominal tenderness masses organ enlargement or bruits  Musculoskeletal: Normal range of motion. He exhibits no edema.  Lymphadenopathy:    He has no cervical adenopathy.  Neurological: He is alert and oriented to person, place, and time. He has normal reflexes. No cranial nerve deficit.  Skin: Skin is warm and dry. No rash noted.  Psychiatric: He has a normal mood and affect. His behavior is normal. Judgment  and thought content normal.  Nursing note and vitals reviewed.   BP (!) 170/60 (BP Location: Right Arm)   Pulse (!) 56   Temp (!) 96.9 F (36.1 C) (Oral)   Ht 5' 8" (1.727 m)   Wt 156 lb (70.8 kg)   BMI 23.72 kg/m   Repeat blood pressure was 142/60 right arm manually sitting Chest x-ray with results pending===   EKG with results pending==== Assessment & Plan:  1. Essential hypertension -Pressure was fairly good today at 142/60 and no changes will be made in his medication, he will bring readings by for review in about 4 weeks.  2. ASCVD (arteriosclerotic cardiovascular disease) -And he does have an irregular irregular rhythm but has preferred in the past not to take any type of blood thinner than aspirin.  He is done quite well with this regimen and because he lives by himself we will continue to let him take his aspirin as he is currently doing.  He denies any chest pain he is  currently not seeing the cardiologist  3. Depression, unspecified depression type -This was severe following his wife's death.  He is currently off his Lorazepam and doing quite well and seems to be in good spirits today and is followed closely by his daughter who just lives a short distance down the road from his home.  4. Age-related cognitive decline -Patient appears to be quite alert today and is in good spirits.  5. Persistent atrial fibrillation (HCC) - EKG 12-Lead  6. Type 2 diabetes mellitus with hyperlipidemia ([place]) -He will continue to monitor blood sugars at home.  His recent A1c was 7.4% and we will have him continue his current treatment.  7. Thrombocytopenia (HCC) -No bleeding issues.  8. Senile purpura ([place])  9. Thoracic aorta atherosclerosis ([place]) -Continue with as aggressive therapeutic lifestyle changes as possible with diet and exercise and control of cholesterol  No orders of the defined types were placed in this encounter.  Patient Instructions  The patient should check his  blood pressures once or twice daily and record these readings and bring these in for review in about 4 weeks Daughter will continue to monitor his medication intake The patient has in the past decided against taking any type of blood thinner other than his aspirin.  He has done quite well with this regimen. The flu shot that he received today may make your arm sore. Omron is the blood pressure monitor that I recommend for checking blood pressure readings Please check the patient's feet periodically to make sure there is no sign of any infection.  He may need to be seen by the podiatrist to trim the nails on his feet.  Arrie Senate MD

## 2016-11-08 NOTE — Patient Instructions (Signed)
The patient should check his blood pressures once or twice daily and record these readings and bring these in for review in about 4 weeks Daughter will continue to monitor his medication intake The patient has in the past decided against taking any type of blood thinner other than his aspirin.  He has done quite well with this regimen. The flu shot that he received today may make your arm sore. Omron is the blood pressure monitor that I recommend for checking blood pressure readings Please check the patient's feet periodically to make sure there is no sign of any infection.  He may need to be seen by the podiatrist to trim the nails on his feet.

## 2016-11-08 NOTE — Addendum Note (Signed)
Addended by: Magdalene RiverBULLINS, Roopa Graver H on: 11/08/2016 09:58 AM   Modules accepted: Orders

## 2016-11-09 ENCOUNTER — Telehealth: Payer: Self-pay | Admitting: Family Medicine

## 2016-11-09 NOTE — Telephone Encounter (Signed)
Pt daughter aware

## 2016-12-05 ENCOUNTER — Other Ambulatory Visit: Payer: Self-pay | Admitting: Family Medicine

## 2016-12-06 DIAGNOSIS — E118 Type 2 diabetes mellitus with unspecified complications: Secondary | ICD-10-CM | POA: Diagnosis not present

## 2016-12-27 ENCOUNTER — Other Ambulatory Visit: Payer: Self-pay | Admitting: Family Medicine

## 2017-01-03 ENCOUNTER — Other Ambulatory Visit: Payer: Self-pay | Admitting: Family Medicine

## 2017-01-24 ENCOUNTER — Other Ambulatory Visit: Payer: Self-pay | Admitting: Family Medicine

## 2017-01-31 ENCOUNTER — Other Ambulatory Visit: Payer: Self-pay | Admitting: Family Medicine

## 2017-01-31 NOTE — Telephone Encounter (Signed)
OV 03/2017 with Dr. Christell ConstantMoore

## 2017-01-31 NOTE — Telephone Encounter (Signed)
Closing encounter Done in Rx pool

## 2017-02-03 NOTE — Progress Notes (Deleted)
Cardiology Office Note   Date:  02/03/2017   ID:  Chad [name], DOB [birth year], MRN [id number]  PCP:  Ernestina Penna, MD  Cardiologist:   Rollene Rotunda, MD Referring:  ***  No chief complaint on file.     History of Present Illness: Chad [name] is a 84 y.o. male who presents for follow up of CAD and atrial fib.  He has a distant history of bypass and was seen by Dr. Alanda Amass.  I saw him last in 2015.  He has atrial fib but has refused anticoagulation.   ***     Past Medical History:  Diagnosis Date  . 1st degree AV block   . Atrial fibrillation (HCC)   . Diabetes mellitus without complication (HCC)   . Gout   . Hyperlipidemia   . Hypertension   . Neuropathy   . Stroke [facility])     Past Surgical History:  Procedure Laterality Date  . carpel tunnel Bilateral   . CORONARY ARTERY BYPASS GRAFT     heart- x 6  . KIDNEY STONE SURGERY    . PARATHYROIDECTOMY       Current Outpatient Medications  Medication Sig Dispense Refill  . allopurinol (ZYLOPRIM) 100 MG tablet Take 100 mg by mouth daily.    Marland Kitchen aspirin 81 MG tablet Take 81 mg by mouth daily.    Marland Kitchen atenolol (TENORMIN) 50 MG tablet TAKE ONE TABLET BY MOUTH ONCE DAILY 30 tablet 11  . atorvastatin (LIPITOR) 20 MG tablet TAKE 1 TABLET BY MOUTH ONCE DAILY AT  6  PM 90 tablet 0  . azithromycin (ZITHROMAX) 250 MG tablet As directed 6 tablet 0  . Cholecalciferol (VITAMIN D3) 2000 units TABS Take 2,000 Units by mouth daily.    . fish oil-omega-3 fatty acids 1000 MG capsule Take 1 g by mouth daily.    . furosemide (LASIX) 40 MG tablet TAKE 1 TABLET BY MOUTH ONCE DAILY 90 tablet 0  . glucose blood (ONE TOUCH ULTRA TEST) test strip Test Tid. Dx E11.9 100 each 5  . glucose blood (ONE TOUCH ULTRA TEST) test strip USE TO CHECK GLUCOSE THREE TIMES DAILY 300 each 0  . Insulin Glargine (LANTUS SOLOSTAR) 100 UNIT/ML Solostar Pen Inject 10 Units into the skin daily at 10 pm. 3 mL 3  . Insulin Pen Needle (PEN NEEDLES) 32G X 4 MM  MISC 1 each by Does not apply route daily. Use with pen to inject insulin QD (Dx: E10.65, E10.8 - IDDM, uncontrolled) 100 each 2  . Lancets (ONETOUCH ULTRASOFT) lancets Test TId. DX E11.9 100 each 5  . LANTUS SOLOSTAR 100 UNIT/ML Solostar Pen INJECT 40 UNITS SUBCUTANEOUSLY AT BEDTIME 30 mL 0  . lisinopril (PRINIVIL,ZESTRIL) 40 MG tablet Take 0.5 tablets (20 mg total) by mouth daily. 90 tablet 0  . lisinopril (PRINIVIL,ZESTRIL) 40 MG tablet TAKE 1 TABLET BY MOUTH ONCE DAILY 90 tablet 0  . Probiotic Product (PROBIOTIC DAILY PO) Take 1 capsule by mouth daily.     No current facility-administered medications for this visit.     Allergies:   Patient has no known allergies.    Social History:  The patient  reports that he quit smoking about 39 years ago. His smoking use included cigarettes. he has never used smokeless tobacco. He reports that he does not drink alcohol or use drugs.   Family History:  The patient's ***family history includes CAD (age of onset: 51) in his daughter; CAD (age of onset: 68)  in his brother; Cancer in his father.    ROS:  Please see the history of present illness.   Otherwise, review of systems are positive for {NONE DEFAULTED:18576::"none"}.   All other systems are reviewed and negative.    PHYSICAL EXAM: VS:  There were no vitals taken for this visit. , BMI There is no height or weight on file to calculate BMI. GENERAL:  Well appearing HEENT:  Pupils equal round and reactive, fundi not visualized, oral mucosa unremarkable NECK:  No jugular venous distention, waveform within normal limits, carotid upstroke brisk and symmetric, no bruits, no thyromegaly LYMPHATICS:  No cervical, inguinal adenopathy LUNGS:  Clear to auscultation bilaterally BACK:  No CVA tenderness CHEST:  Unremarkable HEART:  PMI not displaced or sustained,S1 and S2 within normal limits, no S3, no S4, no clicks, no rubs, *** murmurs ABD:  Flat, positive bowel sounds normal in frequency in pitch, no  bruits, no rebound, no guarding, no midline pulsatile mass, no hepatomegaly, no splenomegaly EXT:  2 plus pulses throughout, no edema, no cyanosis no clubbing SKIN:  No rashes no nodules NEURO:  Cranial nerves II through XII grossly intact, motor grossly intact throughout PSYCH:  Cognitively intact, oriented to person place and time    EKG:  EKG {ACTION; IS/IS [id number]} ordered today. The ekg ordered today demonstrates ***   Recent Labs: 10/16/2016: ALT 15; BUN 28; Creatinine, Ser 1.10; Hemoglobin 13.3; Platelets 138; Potassium 4.2; Sodium 141    Lipid Panel    Component Value Date/Time   CHOL 137 10/16/2016 0944   CHOL 128 06/12/2012 0847   TRIG 53 10/16/2016 0944   TRIG 77 02/23/2015 1642   TRIG 96 06/12/2012 0847   HDL 48 10/16/2016 0944   HDL 46 02/23/2015 1642   HDL 36 (L) 06/12/2012 0847   CHOLHDL 2.9 10/16/2016 0944   LDLCALC 78 10/16/2016 0944   LDLCALC 42 08/19/2013 0830   LDLCALC 73 06/12/2012 0847      Wt Readings from Last 3 Encounters:  11/08/16 156 lb (70.8 kg)  10/16/16 156 lb (70.8 kg)  11/28/15 160 lb 12.8 oz (72.9 kg)      Other studies Reviewed: Additional studies/ records that were reviewed today include: ***. Review of the above records demonstrates:  Please see elsewhere in the note.  ***   ASSESSMENT AND PLAN:  ***  ATRIAL FIB:    He has refused anticoagulation in the past.  ***  CAD:  ***  HTN:  ***   Current medicines are reviewed at length with the patient today.  The patient {ACTIONS; HAS/DOES NOT HAVE:19233} concerns regarding medicines.  The following changes have been made:  {PLAN; NO CHANGE:13088:s}  Labs/ tests ordered today include: *** No orders of the defined types were placed in this encounter.    Disposition:   FU with ***    Signed, Rollene RotundaJames Daylynn Stumpp, MD  02/03/2017 3:09 PM    [facility]

## 2017-02-06 ENCOUNTER — Ambulatory Visit: Payer: Medicare Other | Admitting: Cardiology

## 2017-02-28 ENCOUNTER — Other Ambulatory Visit: Payer: Self-pay | Admitting: *Deleted

## 2017-02-28 MED ORDER — GLUCOSE BLOOD VI STRP: ORAL_STRIP | 2 refills | Status: AC

## 2017-03-05 DIAGNOSIS — E118 Type 2 diabetes mellitus with unspecified complications: Secondary | ICD-10-CM | POA: Diagnosis not present

## 2017-03-14 ENCOUNTER — Other Ambulatory Visit: Payer: Self-pay | Admitting: Family Medicine

## 2017-03-29 ENCOUNTER — Ambulatory Visit: Payer: Medicare Other | Admitting: Family Medicine

## 2017-04-02 ENCOUNTER — Other Ambulatory Visit: Payer: Self-pay | Admitting: Family Medicine

## 2017-04-23 ENCOUNTER — Other Ambulatory Visit: Payer: Self-pay | Admitting: Family Medicine

## 2017-05-01 ENCOUNTER — Other Ambulatory Visit: Payer: Self-pay | Admitting: Family Medicine

## 2017-06-10 ENCOUNTER — Other Ambulatory Visit: Payer: Self-pay | Admitting: Family Medicine

## 2017-07-08 ENCOUNTER — Emergency Department (HOSPITAL_COMMUNITY): Payer: Medicare Other

## 2017-07-08 ENCOUNTER — Inpatient Hospital Stay (HOSPITAL_COMMUNITY)
Admission: EM | Admit: 2017-07-08 | Discharge: 2017-07-14 | DRG: 291 | Disposition: A | Discharge status: Home / Self Care | Payer: Medicare Other | Attending: Internal Medicine | Admitting: Internal Medicine

## 2017-07-08 ENCOUNTER — Encounter (HOSPITAL_COMMUNITY): Payer: Self-pay | Admitting: Emergency Medicine

## 2017-07-08 DIAGNOSIS — I110 Hypertensive heart disease with heart failure: Secondary | ICD-10-CM | POA: Diagnosis not present

## 2017-07-08 DIAGNOSIS — E119 Type 2 diabetes mellitus without complications: Secondary | ICD-10-CM | POA: Diagnosis not present

## 2017-07-08 DIAGNOSIS — I472 Ventricular tachycardia: Secondary | ICD-10-CM | POA: Diagnosis present

## 2017-07-08 DIAGNOSIS — I5023 Acute on chronic systolic (congestive) heart failure: Secondary | ICD-10-CM

## 2017-07-08 DIAGNOSIS — Z8673 Personal history of transient ischemic attack (TIA), and cerebral infarction without residual deficits: Secondary | ICD-10-CM | POA: Diagnosis not present

## 2017-07-08 DIAGNOSIS — R609 Edema, unspecified: Secondary | ICD-10-CM | POA: Diagnosis present

## 2017-07-08 DIAGNOSIS — I10 Essential (primary) hypertension: Secondary | ICD-10-CM | POA: Diagnosis not present

## 2017-07-08 DIAGNOSIS — Z794 Long term (current) use of insulin: Secondary | ICD-10-CM | POA: Diagnosis not present

## 2017-07-08 DIAGNOSIS — I509 Heart failure, unspecified: Secondary | ICD-10-CM | POA: Diagnosis not present

## 2017-07-08 DIAGNOSIS — N1330 Unspecified hydronephrosis: Secondary | ICD-10-CM | POA: Diagnosis present

## 2017-07-08 DIAGNOSIS — N400 Enlarged prostate without lower urinary tract symptoms: Secondary | ICD-10-CM | POA: Diagnosis present

## 2017-07-08 DIAGNOSIS — Z6827 Body mass index (BMI) 27.0-27.9, adult: Secondary | ICD-10-CM

## 2017-07-08 DIAGNOSIS — I700 Atherosclerosis of aorta: Secondary | ICD-10-CM | POA: Diagnosis not present

## 2017-07-08 DIAGNOSIS — N5089 Other specified disorders of the male genital organs: Secondary | ICD-10-CM | POA: Diagnosis present

## 2017-07-08 DIAGNOSIS — Z992 Dependence on renal dialysis: Secondary | ICD-10-CM | POA: Diagnosis not present

## 2017-07-08 DIAGNOSIS — Z7982 Long term (current) use of aspirin: Secondary | ICD-10-CM

## 2017-07-08 DIAGNOSIS — Z800 Family history of malignant neoplasm of digestive organs: Secondary | ICD-10-CM | POA: Diagnosis not present

## 2017-07-08 DIAGNOSIS — E785 Hyperlipidemia, unspecified: Secondary | ICD-10-CM | POA: Diagnosis present

## 2017-07-08 DIAGNOSIS — T447X5A Adverse effect of beta-adrenoreceptor antagonists, initial encounter: Secondary | ICD-10-CM | POA: Diagnosis present

## 2017-07-08 DIAGNOSIS — J90 Pleural effusion, not elsewhere classified: Secondary | ICD-10-CM | POA: Diagnosis not present

## 2017-07-08 DIAGNOSIS — Y92009 Unspecified place in unspecified non-institutional (private) residence as the place of occurrence of the external cause: Secondary | ICD-10-CM | POA: Diagnosis not present

## 2017-07-08 DIAGNOSIS — I495 Sick sinus syndrome: Secondary | ICD-10-CM | POA: Diagnosis present

## 2017-07-08 DIAGNOSIS — M7989 Other specified soft tissue disorders: Secondary | ICD-10-CM | POA: Diagnosis not present

## 2017-07-08 DIAGNOSIS — Z951 Presence of aortocoronary bypass graft: Secondary | ICD-10-CM | POA: Diagnosis not present

## 2017-07-08 DIAGNOSIS — E1065 Type 1 diabetes mellitus with hyperglycemia: Secondary | ICD-10-CM | POA: Diagnosis not present

## 2017-07-08 DIAGNOSIS — I083 Combined rheumatic disorders of mitral, aortic and tricuspid valves: Secondary | ICD-10-CM | POA: Diagnosis present

## 2017-07-08 DIAGNOSIS — K8020 Calculus of gallbladder without cholecystitis without obstruction: Secondary | ICD-10-CM | POA: Diagnosis not present

## 2017-07-08 DIAGNOSIS — M109 Gout, unspecified: Secondary | ICD-10-CM | POA: Diagnosis present

## 2017-07-08 DIAGNOSIS — K5730 Diverticulosis of large intestine without perforation or abscess without bleeding: Secondary | ICD-10-CM | POA: Diagnosis not present

## 2017-07-08 DIAGNOSIS — Z87891 Personal history of nicotine dependence: Secondary | ICD-10-CM | POA: Diagnosis not present

## 2017-07-08 DIAGNOSIS — Z79899 Other long term (current) drug therapy: Secondary | ICD-10-CM

## 2017-07-08 DIAGNOSIS — I2510 Atherosclerotic heart disease of native coronary artery without angina pectoris: Secondary | ICD-10-CM | POA: Diagnosis not present

## 2017-07-08 DIAGNOSIS — Z8249 Family history of ischemic heart disease and other diseases of the circulatory system: Secondary | ICD-10-CM

## 2017-07-08 DIAGNOSIS — I5021 Acute systolic (congestive) heart failure: Secondary | ICD-10-CM | POA: Diagnosis not present

## 2017-07-08 DIAGNOSIS — Z87442 Personal history of urinary calculi: Secondary | ICD-10-CM

## 2017-07-08 DIAGNOSIS — E1165 Type 2 diabetes mellitus with hyperglycemia: Secondary | ICD-10-CM

## 2017-07-08 DIAGNOSIS — I5043 Acute on chronic combined systolic (congestive) and diastolic (congestive) heart failure: Secondary | ICD-10-CM | POA: Diagnosis not present

## 2017-07-08 DIAGNOSIS — I440 Atrioventricular block, first degree: Secondary | ICD-10-CM | POA: Diagnosis present

## 2017-07-08 DIAGNOSIS — I340 Nonrheumatic mitral (valve) insufficiency: Secondary | ICD-10-CM | POA: Diagnosis not present

## 2017-07-08 DIAGNOSIS — I25708 Atherosclerosis of coronary artery bypass graft(s), unspecified, with other forms of angina pectoris: Secondary | ICD-10-CM | POA: Diagnosis not present

## 2017-07-08 DIAGNOSIS — I361 Nonrheumatic tricuspid (valve) insufficiency: Secondary | ICD-10-CM | POA: Diagnosis not present

## 2017-07-08 DIAGNOSIS — Z9111 Patient's noncompliance with dietary regimen: Secondary | ICD-10-CM | POA: Diagnosis not present

## 2017-07-08 DIAGNOSIS — N186 End stage renal disease: Secondary | ICD-10-CM | POA: Diagnosis not present

## 2017-07-08 DIAGNOSIS — G4733 Obstructive sleep apnea (adult) (pediatric): Secondary | ICD-10-CM | POA: Diagnosis present

## 2017-07-08 DIAGNOSIS — I482 Chronic atrial fibrillation, unspecified: Secondary | ICD-10-CM

## 2017-07-08 DIAGNOSIS — E43 Unspecified severe protein-calorie malnutrition: Secondary | ICD-10-CM | POA: Diagnosis not present

## 2017-07-08 DIAGNOSIS — I429 Cardiomyopathy, unspecified: Secondary | ICD-10-CM | POA: Diagnosis present

## 2017-07-08 DIAGNOSIS — E1122 Type 2 diabetes mellitus with diabetic chronic kidney disease: Secondary | ICD-10-CM | POA: Diagnosis not present

## 2017-07-08 DIAGNOSIS — IMO0001 Reserved for inherently not codable concepts without codable children: Secondary | ICD-10-CM | POA: Diagnosis present

## 2017-07-08 DIAGNOSIS — R300 Dysuria: Secondary | ICD-10-CM | POA: Diagnosis not present

## 2017-07-08 DIAGNOSIS — Z7189 Other specified counseling: Secondary | ICD-10-CM | POA: Diagnosis not present

## 2017-07-08 DIAGNOSIS — R0601 Orthopnea: Secondary | ICD-10-CM | POA: Diagnosis not present

## 2017-07-08 DIAGNOSIS — R600 Localized edema: Secondary | ICD-10-CM | POA: Diagnosis not present

## 2017-07-08 HISTORY — DX: Atherosclerotic heart disease of native coronary artery without angina pectoris: I25.10

## 2017-07-08 LAB — BASIC METABOLIC PANEL
Anion gap: 9 (ref 5–15)
BUN: 16 mg/dL (ref 6–20)
CO2: 28 mmol/L (ref 22–32)
CREATININE: 1.14 mg/dL (ref 0.61–1.24)
Calcium: 8.2 mg/dL — ABNORMAL LOW (ref 8.9–10.3)
Chloride: 100 mmol/L — ABNORMAL LOW (ref 101–111)
GFR calc Af Amer: >60 mL/min (ref >60)
GFR, EST NON AFRICAN AMERICAN: 57 mL/min — AB (ref >60)
Glucose, Bld: 157 mg/dL — ABNORMAL HIGH (ref 65–99)
POTASSIUM: 3.5 mmol/L (ref 3.5–5.1)
SODIUM: 137 mmol/L (ref 135–145)

## 2017-07-08 LAB — CBC WITH DIFFERENTIAL/PLATELET
BASOS PCT: 0 %
Basophils Absolute: 0 10*3/uL (ref 0.0–0.1)
EOS ABS: 0 10*3/uL (ref 0.0–0.7)
Eosinophils Relative: 1 %
HCT: 41.2 % (ref 39.0–52.0)
HEMOGLOBIN: 13.5 g/dL (ref 13.0–17.0)
Lymphocytes Relative: 29 %
Lymphs Abs: 1.9 10*3/uL (ref 0.7–4.0)
MCH: 32.5 pg (ref 26.0–34.0)
MCHC: 32.8 g/dL (ref 30.0–36.0)
MCV: 99.3 fL (ref 78.0–100.0)
MONOS PCT: 12 %
Monocytes Absolute: 0.8 10*3/uL (ref 0.1–1.0)
NEUTROS PCT: 58 %
Neutro Abs: 3.8 10*3/uL (ref 1.7–7.7)
PLATELETS: 208 10*3/uL (ref 150–400)
RBC: 4.15 MIL/uL — AB (ref 4.22–5.81)
RDW: 16 % — ABNORMAL HIGH (ref 11.5–15.5)
WBC: 6.5 10*3/uL (ref 4.0–10.5)

## 2017-07-08 LAB — TROPONIN I: Troponin I: <0.03 ng/mL (ref <0.03)

## 2017-07-08 LAB — BRAIN NATRIURETIC PEPTIDE: B NATRIURETIC PEPTIDE 5: 1752 pg/mL — AB (ref 0.0–100.0)

## 2017-07-08 LAB — HEPATIC FUNCTION PANEL
ALBUMIN: 2.4 g/dL — AB (ref 3.5–5.0)
ALT: 19 U/L (ref 17–63)
AST: 22 U/L (ref 15–41)
Alkaline Phosphatase: 121 U/L (ref 38–126)
BILIRUBIN INDIRECT: 0.5 mg/dL (ref 0.3–0.9)
BILIRUBIN TOTAL: 0.8 mg/dL (ref 0.3–1.2)
Bilirubin, Direct: 0.3 mg/dL (ref 0.1–0.5)
Total Protein: 6.2 g/dL — ABNORMAL LOW (ref 6.5–8.1)

## 2017-07-08 LAB — GLUCOSE, CAPILLARY: Glucose-Capillary: 144 mg/dL — ABNORMAL HIGH (ref 65–99)

## 2017-07-08 LAB — CBG MONITORING, ED: GLUCOSE-CAPILLARY: 158 mg/dL — AB (ref 65–99)

## 2017-07-08 MED ORDER — ACETAMINOPHEN 325 MG PO TABS: 650.0000 mg | ORAL_TABLET | Freq: Four times a day (QID) | ORAL | Status: DC | PRN

## 2017-07-08 MED ORDER — PRO-STAT SUGAR FREE PO LIQD
30.0000 mL | Freq: Two times a day (BID) | ORAL | Status: DC
  Administered 2017-07-08 – 2017-07-14 (×12): 30 mL via ORAL
  Filled 2017-07-08 (×16): qty 30

## 2017-07-08 MED ORDER — OMEGA-3-ACID ETHYL ESTERS 1 G PO CAPS
1.0000 g | ORAL_CAPSULE | Freq: Every day | ORAL | Status: DC
  Administered 2017-07-09 – 2017-07-14 (×6): 1 g via ORAL
  Filled 2017-07-08 (×6): qty 1

## 2017-07-08 MED ORDER — FUROSEMIDE 10 MG/ML IJ SOLN
40.0000 mg | Freq: Once | INTRAMUSCULAR | Status: AC
  Administered 2017-07-08: 40 mg via INTRAVENOUS
  Filled 2017-07-08: qty 4

## 2017-07-08 MED ORDER — POTASSIUM CHLORIDE CRYS ER 20 MEQ PO TBCR
40.0000 meq | EXTENDED_RELEASE_TABLET | Freq: Once | ORAL | Status: AC
  Administered 2017-07-08: 40 meq via ORAL
  Filled 2017-07-08: qty 2

## 2017-07-08 MED ORDER — INSULIN ASPART 100 UNIT/ML ~~LOC~~ SOLN
0.0000 [IU] | Freq: Three times a day (TID) | SUBCUTANEOUS | Status: DC
  Administered 2017-07-09 (×2): 1 [IU] via SUBCUTANEOUS
  Administered 2017-07-10: 3 [IU] via SUBCUTANEOUS
  Administered 2017-07-11: 2 [IU] via SUBCUTANEOUS
  Administered 2017-07-11: 1 [IU] via SUBCUTANEOUS
  Administered 2017-07-12: 2 [IU] via SUBCUTANEOUS
  Administered 2017-07-12 – 2017-07-13 (×2): 1 [IU] via SUBCUTANEOUS
  Administered 2017-07-13: 2 [IU] via SUBCUTANEOUS

## 2017-07-08 MED ORDER — SODIUM CHLORIDE 0.9% FLUSH
3.0000 mL | Freq: Two times a day (BID) | INTRAVENOUS | Status: DC
  Administered 2017-07-08 – 2017-07-14 (×11): 3 mL via INTRAVENOUS

## 2017-07-08 MED ORDER — INSULIN ASPART 100 UNIT/ML ~~LOC~~ SOLN
0.0000 [IU] | Freq: Every day | SUBCUTANEOUS | Status: DC
  Administered 2017-07-12: 2 [IU] via SUBCUTANEOUS

## 2017-07-08 MED ORDER — INSULIN ASPART 100 UNIT/ML ~~LOC~~ SOLN: 0.0000 [IU] | Freq: Three times a day (TID) | SUBCUTANEOUS | Status: DC

## 2017-07-08 MED ORDER — FUROSEMIDE 10 MG/ML IJ SOLN
40.0000 mg | Freq: Every day | INTRAMUSCULAR | Status: DC
  Administered 2017-07-09: 40 mg via INTRAVENOUS
  Filled 2017-07-08: qty 4

## 2017-07-08 MED ORDER — SODIUM CHLORIDE 0.9 % IV SOLN: 250.0000 mL | INTRAVENOUS | Status: DC | PRN

## 2017-07-08 MED ORDER — ASPIRIN 81 MG PO CHEW
81.0000 mg | CHEWABLE_TABLET | Freq: Every day | ORAL | Status: DC
  Administered 2017-07-09 – 2017-07-11 (×3): 81 mg via ORAL
  Filled 2017-07-08 (×3): qty 1

## 2017-07-08 MED ORDER — INSULIN ASPART 100 UNIT/ML ~~LOC~~ SOLN: 0.0000 [IU] | Freq: Every day | SUBCUTANEOUS | Status: DC

## 2017-07-08 MED ORDER — SODIUM CHLORIDE 0.9% FLUSH: 3.0000 mL | INTRAVENOUS | Status: DC | PRN

## 2017-07-08 MED ORDER — INSULIN GLARGINE 100 UNIT/ML ~~LOC~~ SOLN
10.0000 [IU] | Freq: Every day | SUBCUTANEOUS | Status: DC
  Administered 2017-07-08 – 2017-07-13 (×6): 10 [IU] via SUBCUTANEOUS
  Filled 2017-07-08 (×7): qty 0.1

## 2017-07-08 MED ORDER — ALLOPURINOL 100 MG PO TABS
100.0000 mg | ORAL_TABLET | Freq: Every day | ORAL | Status: DC
  Administered 2017-07-09 – 2017-07-14 (×6): 100 mg via ORAL
  Filled 2017-07-08 (×6): qty 1

## 2017-07-08 MED ORDER — ACETAMINOPHEN 650 MG RE SUPP: 650.0000 mg | Freq: Four times a day (QID) | RECTAL | Status: DC | PRN

## 2017-07-08 MED ORDER — LISINOPRIL 10 MG PO TABS
40.0000 mg | ORAL_TABLET | Freq: Every day | ORAL | Status: DC
  Administered 2017-07-09 – 2017-07-14 (×6): 40 mg via ORAL
  Filled 2017-07-08 (×6): qty 4

## 2017-07-08 MED ORDER — ENOXAPARIN SODIUM 40 MG/0.4ML ~~LOC~~ SOLN
40.0000 mg | SUBCUTANEOUS | Status: DC
  Administered 2017-07-08 – 2017-07-10 (×3): 40 mg via SUBCUTANEOUS
  Filled 2017-07-08 (×3): qty 0.4

## 2017-07-08 MED ORDER — ENSURE ENLIVE PO LIQD
237.0000 mL | Freq: Two times a day (BID) | ORAL | Status: DC
  Administered 2017-07-09 – 2017-07-14 (×9): 237 mL via ORAL

## 2017-07-08 MED ORDER — VITAMIN D 1000 UNITS PO TABS
2000.0000 [IU] | ORAL_TABLET | Freq: Every day | ORAL | Status: DC
  Administered 2017-07-09 – 2017-07-14 (×6): 2000 [IU] via ORAL
  Filled 2017-07-08 (×6): qty 2

## 2017-07-08 MED ORDER — ATORVASTATIN CALCIUM 20 MG PO TABS
20.0000 mg | ORAL_TABLET | Freq: Every day | ORAL | Status: DC
  Administered 2017-07-08 – 2017-07-13 (×6): 20 mg via ORAL
  Filled 2017-07-08 (×6): qty 1

## 2017-07-08 MED ORDER — ATENOLOL 25 MG PO TABS
25.0000 mg | ORAL_TABLET | Freq: Every day | ORAL | Status: DC
  Administered 2017-07-08: 25 mg via ORAL
  Filled 2017-07-08 (×2): qty 1

## 2017-07-08 NOTE — ED Provider Notes (Signed)
[facility] EMERGENCY DEPARTMENT Provider Note   CSN: [id number] Arrival date & time: 07/08/17  1357     History   Chief Complaint Chief Complaint  Patient presents with  . Leg Swelling    HPI Chad [name] is a 84 y.o. male.  HPI Patient presents to the emergency room for evaluation of leg swelling.  Patient states he noticed the symptoms about a week ago.  He has noticed increasing swelling in his lower legs and its progressing up his thighs towards his scrotum.  The right leg seems to be worse than the left but he has had surgery on that right leg in the past.  He denies any fevers or chills.  No chest pain or shortness of breath.  No abdominal pain.  No vomiting or diarrhea.  Patient states he has not had trouble with leg swelling like this in the past. Past Medical History:  Diagnosis Date  . 1st degree AV block   . Atrial fibrillation (HCC)   . Diabetes mellitus without complication (HCC)   . Gout   . Hyperlipidemia   . Hypertension   . Neuropathy   . Stroke [facility])     Patient Active Problem List   Diagnosis Date Noted  . Clostridium difficile infection 08/26/2015  . Depression 07/08/2015  . Age-related cognitive decline 07/08/2015  . Gait instability 07/08/2015  . Insulin dependent diabetes mellitus (HCC)   . Syncope 07/06/2015  . Anxiety 07/06/2015  . Altered mental state 07/06/2015  . Bradycardia with 41 - 50 beats per minute 07/06/2015  . Hyperuricemia 02/23/2015  . Thoracic aorta atherosclerosis (HCC) 02/23/2015  . Senile purpura (HCC) 02/23/2015  . Thrombocytopenia (HCC) 05/04/2014  . Long term (current) use of anticoagulants 08/20/2013  . Atrial fibrillation, unspecified 08/19/2013  . BPH (benign prostatic hyperplasia) 04/13/2013  . ASCVD (arteriosclerotic cardiovascular disease) 11/13/2012  . Obstructive sleep apnea 11/13/2012  . HTN (hypertension) 06/12/2012  . Hyperlipidemia LDL goal <70 06/12/2012  . Diabetes mellitus, insulin dependent  (IDDM), uncontrolled (HCC) 06/12/2012    Past Surgical History:  Procedure Laterality Date  . carpel tunnel Bilateral   . CORONARY ARTERY BYPASS GRAFT     heart- x 6  . KIDNEY STONE SURGERY    . PARATHYROIDECTOMY          Home Medications    Prior to Admission medications   Medication Sig Start Date End Date Taking? Authorizing Provider  allopurinol (ZYLOPRIM) 100 MG tablet Take 100 mg by mouth daily.   Yes [provider]  aspirin 81 MG tablet Take 81 mg by mouth daily.   Yes [provider]  atenolol (TENORMIN) 50 MG tablet TAKE 1 TABLET BY MOUTH ONCE DAILY 06/11/17  Yes Ernestina Penna, MD  atorvastatin (LIPITOR) 20 MG tablet TAKE 1 TABLET BY MOUTH ONCE DAILY AT  6  PM 04/24/17  Yes Ernestina Penna, MD  Cholecalciferol (VITAMIN D3) 2000 units TABS Take 2,000 Units by mouth daily.   Yes [provider]  fish oil-omega-3 fatty acids 1000 MG capsule Take 1 g by mouth daily.   Yes [provider]  furosemide (LASIX) 20 MG tablet TAKE 2 TABLETS BY MOUTH ONCE DAILY Patient taking differently: TAKE 1 TABLET BY MOUTH ONCE DAILY 05/02/17  Yes Ernestina Penna, MD  Insulin Glargine (LANTUS SOLOSTAR) 100 UNIT/ML Solostar Pen Inject 10 Units into the skin daily at 10 pm. 10/16/16  Yes Gottschalk, Ashly M, DO  lisinopril (PRINIVIL,ZESTRIL) 40 MG tablet TAKE 1 TABLET  BY MOUTH ONCE DAILY 05/02/17  Yes Ernestina Penna, MD  Probiotic Product (PROBIOTIC DAILY PO) Take 1 capsule by mouth daily.   Yes [provider]  glucose blood (ONE TOUCH ULTRA TEST) test strip Test Tid. Dx E11.9 10/16/16   Delynn Flavin M, DO  glucose blood (ONE TOUCH ULTRA TEST) test strip USE TO CHECK GLUCOSE THREE TIMES DAILY 02/28/17   Ernestina Penna, MD  Insulin Pen Needle (PEN NEEDLES) 32G X 4 MM MISC 1 each by Does not apply route daily. Use with pen to inject insulin QD (Dx: E10.65, E10.8 - IDDM, uncontrolled) 11/09/15   Ernestina Penna, MD  Lancets [facility] ULTRASOFT) lancets  Test TId. DX E11.9 10/16/16   Delynn Flavin M, DO  LANTUS SOLOSTAR 100 UNIT/ML Solostar Pen INJECT 40 UNITS SUBCUTANEOUSLY AT BEDTIME Patient not taking: Reported on 07/08/2017 01/31/17   Ernestina Penna, MD    Family History Family History  Problem Relation Age of Onset  . Cancer Father        throat  . CAD Brother 13       MI questionable, sudden death  . CAD Daughter 25       MI, stent    Social History Social History   Tobacco Use  . Smoking status: Former Smoker    Types: Cigarettes    Last attempt to quit: 06/12/1977    Years since quitting: 40.0  . Smokeless tobacco: Never Used  Substance Use Topics  . Alcohol use: No  . Drug use: No     Allergies   Patient has no known allergies.   Review of Systems Review of Systems  All other systems reviewed and are negative.    Physical Exam Updated Vital Signs BP (!) 167/77   Pulse (!) 34   Temp 98 F (36.7 C) (Oral)   Resp (!) 22   Ht 1.626 m (5\' 4" )   Wt 65.8 kg (145 lb)   SpO2 92%   BMI 24.89 kg/m   Physical Exam  Constitutional: He appears well-developed and well-nourished. No distress.  HENT:  Head: Normocephalic and atraumatic.  Right Ear: External ear normal.  Left Ear: External ear normal.  Eyes: Conjunctivae are normal. Right eye exhibits no discharge. Left eye exhibits no discharge. No scleral icterus.  Neck: Neck supple. No tracheal deviation present.  Cardiovascular: Normal rate, regular rhythm and intact distal pulses.  Pulmonary/Chest: Effort normal and breath sounds normal. No stridor. No respiratory distress. He has no wheezes. He has no rales.  Abdominal: Soft. Bowel sounds are normal. He exhibits no distension. There is no tenderness. There is no rebound and no guarding.  Musculoskeletal: He exhibits edema. He exhibits no tenderness.  Tense pitting edema bilateral lower extremities to the thighs bilaterally, swelling is greater in the right than the left  Neurological: He is alert. He  has normal strength. No cranial nerve deficit (no facial droop, extraocular movements intact, no slurred speech) or sensory deficit. He exhibits normal muscle tone. He displays no seizure activity. Coordination normal.  Skin: Skin is warm and dry. No rash noted.  Psychiatric: He has a normal mood and affect.  Nursing note and vitals reviewed.    ED Treatments / Results  Labs (all labs ordered are listed, but only abnormal results are displayed) Labs Reviewed  CBC WITH DIFFERENTIAL/PLATELET - Abnormal; Notable for the following components:      Result Value   RBC 4.15 (*)    RDW 16.0 (*)  All other components within normal limits  BASIC METABOLIC PANEL - Abnormal; Notable for the following components:   Chloride 100 (*)    Glucose, Bld 157 (*)    Calcium 8.2 (*)    GFR calc non Af Amer 57 (*)    All other components within normal limits  BRAIN NATRIURETIC PEPTIDE - Abnormal; Notable for the following components:   B Natriuretic Peptide 1,752.0 (*)    All other components within normal limits  HEPATIC FUNCTION PANEL - Abnormal; Notable for the following components:   Total Protein 6.2 (*)    Albumin 2.4 (*)    All other components within normal limits  CBG MONITORING, ED - Abnormal; Notable for the following components:   Glucose-Capillary 158 (*)    All other components within normal limits  TROPONIN I    EKG EKG Interpretation  Date/Time:  Monday July 08 2017 15:52:00 EDT Ventricular Rate:  69 PR Interval:    QRS Duration: 150 QT Interval:  476 QTC Calculation: 510 R Axis:   136 Text Interpretation:  Atrial fibrillation Nonspecific intraventricular conduction delay Lateral infarct, age indeterminate Probable anteroseptal infarct, old No significant change since last tracing Confirmed by Linwood Dibbles [phone]) on 07/08/2017 4:23:12 PM   Radiology Dg Chest 2 View  Result Date: 07/08/2017 CLINICAL DATA:  84 y/o  M; lower leg and lower abdominal edema. EXAM: CHEST - 2 VIEW  COMPARISON:  11/08/2016 chest radiograph. FINDINGS: Normal cardiac silhouette given projection and technique. Post median sternotomy and CABG. Calcific atherosclerosis of aorta. Moderate right and small left pleural effusions. Reticular opacities of the lungs probably representing interstitial edema. No acute osseous abnormality is evident. IMPRESSION: 1. Moderate right and small left pleural effusions. 2. Interstitial edema. 3. Aortic atherosclerosis. Electronically Signed   By: Mitzi Hansen M.D.   On: 07/08/2017 15:02   US Venous Img Lower Bilateral  Result Date: 07/08/2017 CLINICAL DATA:  Bilateral lower extremity edema right greater than EXAM: BILATERAL LOWER EXTREMITY VENOUS DOPPLER ULTRASOUND TECHNIQUE: Gray-scale sonography with graded compression, as well as color Doppler and duplex ultrasound were performed to evaluate the lower extremity deep venous systems from the level of the common femoral vein and including the common femoral, femoral, profunda femoral, popliteal and calf veins including the posterior tibial, peroneal and gastrocnemius veins when visible. The superficial great saphenous vein was also interrogated. Spectral Doppler was utilized to evaluate flow at rest and with distal augmentation maneuvers in the common femoral, femoral and popliteal veins. COMPARISON:  None. FINDINGS: RIGHT LOWER EXTREMITY Common Femoral Vein: No evidence of thrombus. Normal compressibility, respiratory phasicity and response to augmentation. Saphenofemoral Junction: No evidence of thrombus. Normal compressibility and flow on color Doppler imaging. Profunda Femoral Vein: No evidence of thrombus. Normal compressibility and flow on color Doppler imaging. Femoral Vein: No evidence of thrombus. Normal compressibility, respiratory phasicity and response to augmentation. Popliteal Vein: No evidence of thrombus. Normal compressibility, respiratory phasicity and response to augmentation. Calf Veins: No  evidence of thrombus. Normal compressibility and flow on color Doppler imaging. Superficial Great Saphenous Vein: No evidence of thrombus. Normal compressibility. Venous Reflux:  None. Other Findings:  Subcutaneous edema noted. LEFT LOWER EXTREMITY Common Femoral Vein: No evidence of thrombus. Normal compressibility, respiratory phasicity and response to augmentation. Saphenofemoral Junction: No evidence of thrombus. Normal compressibility and flow on color Doppler imaging. Profunda Femoral Vein: No evidence of thrombus. Normal compressibility and flow on color Doppler imaging. Femoral Vein: No evidence of thrombus. Normal compressibility, respiratory phasicity and response  to augmentation. Popliteal Vein: No evidence of thrombus. Normal compressibility, respiratory phasicity and response to augmentation. Calf Veins: No evidence of thrombus. Normal compressibility and flow on color Doppler imaging. Superficial Great Saphenous Vein: No evidence of thrombus. Normal compressibility. Venous Reflux:  None. Other Findings:  Subcutaneous edema evident. IMPRESSION: Negative for significant lower extremity DVT in either leg. Peripheral subcutaneous edema noted bilaterally. Electronically Signed   By: Judie PetitM.  Shick M.D.   On: 07/08/2017 17:05    Procedures Procedures (including critical care time)  Medications Ordered in ED Medications  furosemide (LASIX) injection 40 mg (has no administration in time range)  potassium chloride SA (K-DUR,KLOR-CON) CR tablet 40 mEq (has no administration in time range)     Initial Impression / Assessment and Plan / ED Course  I have reviewed the triage vital signs and the nursing notes.  Pertinent labs & imaging results that were available during my care of the patient were reviewed by me and considered in my medical decision making (see chart for details).  Clinical Course as of Jul 09 1814  Mon Jul 08, 2017  1800 LAbs notable for increased BNP and chest x-ray showing evidence  of congestive heart failure.  Doppler study without DVT   [JK]    Clinical Course User Index [JK] Linwood DibblesKnapp, Hoover Grewe, MD   Patient presented to the emergency room with complaints of lower leg swelling.  Patient has significant edema on his physical exam.  Laboratory tests are consistent with an exacerbation of his congestive heart failure.  Patient is already on oral diuretics.  I have ordered IV Lasix.  Considering his degree of peripheral edema and evidence of pulmonary edema despite being on oral medications I think is reasonable to bring him in for IV diuresis.   Final Clinical Impressions(s) / ED Diagnoses   Final diagnoses:  Acute on chronic congestive heart failure, unspecified heart failure type [facility](HCC)    ED Discharge Orders    None       Linwood DibblesKnapp, Ysabela Keisler, MD 07/08/17 1816

## 2017-07-08 NOTE — ED Triage Notes (Signed)
Pt has multiple complaints.  States most of the problems are chronic, but the most significant one is swelling in his feet and legs progressing up to his scrotum.

## 2017-07-08 NOTE — ED Notes (Signed)
Pt c/o feeling shaky as if his sugar is low. Pt's CBG 158. Pt told RN that his PCP instructs him to keep his blood sugar at 175 and if he goes below that to "eat something sweet". Pt given Coke to drink per his request.

## 2017-07-08 NOTE — H&P (Addendum)
TRH H&P   Patient Demographics:    Chad [name], is a 84 y.o. male  MRN: [id number]   DOB - 08/15/1931  Admit Date - 07/08/2017  Outpatient Primary MD for the patient is Ernestina PennaMoore, Donald W, MD  Referring MD/NP/PA: Iantha FallenJohn Knapp  Outpatient Specialists:     Patient coming from: home  Chief Complaint  Patient presents with  . Leg Swelling      HPI:    Chad PointerHoward [name]  is a 84 y.o. male, w hypertension, hyperlipidemia , Pafib, CAD s/p CABG, CVA, Dm2, presents withc/o edema worsening for the past 4 days. No recent adjustment to medication. Pt denies fever, chills, cough, cp, palp, n/v, diarrhea, brbpr, black stool.   In Ed,  CXR IMPRESSION: 1. Moderate right and small left pleural effusions. 2. Interstitial edema. 3. Aortic atherosclerosis.  Bilateral lower ext ultrasound IMPRESSION: Negative for significant lower extremity DVT in either leg.  Wbc 6.5, Hgb 13.5, Plt 208 Na 137, K 3.5, Bun 16, Creatinien 1.14  BNP 1,752.0 Trop <0.03  Alb 2.4  Pt will be admitted for edema, bilateral pleural effusions, mild CHF.     Review of systems:    In addition to the HPI above,  No Fever-chills, No Headache, No changes with Vision or hearing, No problems swallowing food or Liquids, No Chest pain, No Cough No Abdominal pain, No Nausea or Vommitting, Bowel movements are regular, No Blood in stool or Urine, No dysuria, No new skin rashes or bruises, No new joints pains-aches,  No new weakness, tingling, numbness in any extremity, No recent weight gain or loss, No polyuria, polydypsia or polyphagia, No significant Mental Stressors.  A full 10 point Review of Systems was done, except as stated above, all other Review of Systems were negative.   With Past History of the following :    Past Medical History:  Diagnosis Date  . 1st degree AV block   . Atrial fibrillation  (HCC)   . Diabetes mellitus without complication (HCC)   . Gout   . Hyperlipidemia   . Hypertension   . Neuropathy   . Stroke [facility](HCC)       Past Surgical History:  Procedure Laterality Date  . carpel tunnel Bilateral   . CORONARY ARTERY BYPASS GRAFT     heart- x 6  . KIDNEY STONE SURGERY    . PARATHYROIDECTOMY        Social History:     Social History   Tobacco Use  . Smoking status: Former Smoker    Types: Cigarettes    Last attempt to quit: 06/12/1977    Years since quitting: 40.0  . Smokeless tobacco: Never Used  Substance Use Topics  . Alcohol use: No     Lives - at home  Mobility - walks by self   Family History :     Family History  Problem Relation Age  of Onset  . Cancer Father        throat  . CAD Brother 46       MI questionable, sudden death  . CAD Daughter 72       MI, stent       Home Medications:   Prior to Admission medications   Medication Sig Start Date End Date Taking? Authorizing Provider  allopurinol (ZYLOPRIM) 100 MG tablet Take 100 mg by mouth daily.   Yes [provider]  aspirin 81 MG tablet Take 81 mg by mouth daily.   Yes [provider]  atenolol (TENORMIN) 50 MG tablet TAKE 1 TABLET BY MOUTH ONCE DAILY 06/11/17  Yes Ernestina Penna, MD  atorvastatin (LIPITOR) 20 MG tablet TAKE 1 TABLET BY MOUTH ONCE DAILY AT  6  PM 04/24/17  Yes Ernestina Penna, MD  Cholecalciferol (VITAMIN D3) 2000 units TABS Take 2,000 Units by mouth daily.   Yes [provider]  fish oil-omega-3 fatty acids 1000 MG capsule Take 1 g by mouth daily.   Yes [provider]  furosemide (LASIX) 20 MG tablet TAKE 2 TABLETS BY MOUTH ONCE DAILY Patient taking differently: TAKE 1 TABLET BY MOUTH ONCE DAILY 05/02/17  Yes Ernestina Penna, MD  Insulin Glargine (LANTUS SOLOSTAR) 100 UNIT/ML Solostar Pen Inject 10 Units into the skin daily at 10 pm. 10/16/16  Yes Gottschalk, Ashly M, DO  lisinopril (PRINIVIL,ZESTRIL) 40 MG tablet TAKE 1 TABLET  BY MOUTH ONCE DAILY 05/02/17  Yes Ernestina Penna, MD  Probiotic Product (PROBIOTIC DAILY PO) Take 1 capsule by mouth daily.   Yes [provider]  glucose blood (ONE TOUCH ULTRA TEST) test strip Test Tid. Dx E11.9 10/16/16   Delynn Flavin M, DO  glucose blood (ONE TOUCH ULTRA TEST) test strip USE TO CHECK GLUCOSE THREE TIMES DAILY 02/28/17   Ernestina Penna, MD  Insulin Pen Needle (PEN NEEDLES) 32G X 4 MM MISC 1 each by Does not apply route daily. Use with pen to inject insulin QD (Dx: E10.65, E10.8 - IDDM, uncontrolled) 11/09/15   Ernestina Penna, MD  Lancets [facility] ULTRASOFT) lancets Test TId. DX E11.9 10/16/16   Delynn Flavin M, DO  LANTUS SOLOSTAR 100 UNIT/ML Solostar Pen INJECT 40 UNITS SUBCUTANEOUSLY AT BEDTIME Patient not taking: Reported on 07/08/2017 01/31/17   Ernestina Penna, MD     Allergies:    No Known Allergies   Physical Exam:   Vitals  Blood pressure (!) 167/77, pulse (!) 34, temperature 98 F (36.7 C), temperature source Oral, resp. rate (!) 22, height 5\' 4"  (1.626 m), weight 65.8 kg (145 lb), SpO2 92 %.   1. General  lying in bed in NAD,   2. Normal affect and insight, Not Suicidal or Homicidal, Awake Alert, Oriented X 3.  3. No F.N deficits, ALL C.Nerves Intact, Strength 5/5 all 4 extremities, Sensation intact all 4 extremities, Plantars down going.  4. Ears and Eyes appear Normal, Conjunctivae clear, PERRLA. Moist Oral Mucosa.  5. Supple Neck, slight  JVD, No cervical lymphadenopathy appriciated, No Carotid Bruits.  6. Symmetrical Chest wall movement, Good air movement bilaterally,crackles right > left , no wheezing  7. Irr, irr, s1, s2,  No Gallops, Rubs or Murmurs, No Parasternal Heave.  8. Positive Bowel Sounds, Abdomen Soft, No tenderness, No organomegaly appriciated,No rebound -guarding or rigidity.  9.  No Cyanosis, , No Skin Rash or Bruise. 1+ edema, slight redness, no warmth  10. Good muscle tone,  joints appear normal , no  effusions, Normal ROM.  11. No Palpable Lymph Nodes in Neck or Axillae      Data Review:    CBC Recent Labs  Lab 07/08/17 1440  WBC 6.5  HGB 13.5  HCT 41.2  PLT 208  MCV 99.3  MCH 32.5  MCHC 32.8  RDW 16.0*  LYMPHSABS 1.9  MONOABS 0.8  EOSABS 0.0  BASOSABS 0.0   ------------------------------------------------------------------------------------------------------------------  Chemistries  Recent Labs  Lab 07/08/17 1440 07/08/17 1541  NA 137  --   K 3.5  --   CL 100*  --   CO2 28  --   GLUCOSE 157*  --   BUN 16  --   CREATININE 1.14  --   CALCIUM 8.2*  --   AST  --  22  ALT  --  19  ALKPHOS  --  121  BILITOT  --  0.8   ------------------------------------------------------------------------------------------------------------------ estimated creatinine clearance is 40.4 mL/min (by C-G formula based on SCr of 1.14 mg/dL). ------------------------------------------------------------------------------------------------------------------ No results for input(s): TSH, T4TOTAL, T3FREE, THYROIDAB in the last 72 hours.  Invalid input(s): FREET3  Coagulation profile No results for input(s): INR, PROTIME in the last 168 hours. ------------------------------------------------------------------------------------------------------------------- No results for input(s): DDIMER in the last 72 hours. -------------------------------------------------------------------------------------------------------------------  Cardiac Enzymes Recent Labs  Lab 07/08/17 1440  TROPONINI <0.03   ------------------------------------------------------------------------------------------------------------------    Component Value Date/Time   BNP 1,752.0 (H) 07/08/2017 1440     ---------------------------------------------------------------------------------------------------------------  Urinalysis    Component Value Date/Time   COLORURINE YELLOW 07/06/2015 2010   APPEARANCEUR  CLEAR 07/06/2015 2010   LABSPEC 1.020 07/06/2015 2010   PHURINE 5.5 07/06/2015 2010   GLUCOSEU NEGATIVE 07/06/2015 2010   HGBUR SMALL (A) 07/06/2015 2010   BILIRUBINUR NEGATIVE 07/06/2015 2010   BILIRUBINUR negative 04/13/2013 1030   KETONESUR NEGATIVE 07/06/2015 2010   PROTEINUR 30 (A) 07/06/2015 2010   UROBILINOGEN negative 04/13/2013 1030   UROBILINOGEN 0.2 08/12/2009 2205   NITRITE NEGATIVE 07/06/2015 2010   LEUKOCYTESUR NEGATIVE 07/06/2015 2010    ----------------------------------------------------------------------------------------------------------------   Imaging Results:    Dg Chest 2 View  Result Date: 07/08/2017 CLINICAL DATA:  84 y/o  M; lower leg and lower abdominal edema. EXAM: CHEST - 2 VIEW COMPARISON:  11/08/2016 chest radiograph. FINDINGS: Normal cardiac silhouette given projection and technique. Post median sternotomy and CABG. Calcific atherosclerosis of aorta. Moderate right and small left pleural effusions. Reticular opacities of the lungs probably representing interstitial edema. No acute osseous abnormality is evident. IMPRESSION: 1. Moderate right and small left pleural effusions. 2. Interstitial edema. 3. Aortic atherosclerosis. Electronically Signed   By: Mitzi Hansen M.D.   On: 07/08/2017 15:02   US Venous Img Lower Bilateral  Result Date: 07/08/2017 CLINICAL DATA:  Bilateral lower extremity edema right greater than EXAM: BILATERAL LOWER EXTREMITY VENOUS DOPPLER ULTRASOUND TECHNIQUE: Gray-scale sonography with graded compression, as well as color Doppler and duplex ultrasound were performed to evaluate the lower extremity deep venous systems from the level of the common femoral vein and including the common femoral, femoral, profunda femoral, popliteal and calf veins including the posterior tibial, peroneal and gastrocnemius veins when visible. The superficial great saphenous vein was also interrogated. Spectral Doppler was utilized to evaluate flow  at rest and with distal augmentation maneuvers in the common femoral, femoral and popliteal veins. COMPARISON:  None. FINDINGS: RIGHT LOWER EXTREMITY Common Femoral Vein: No evidence of thrombus. Normal compressibility, respiratory phasicity and response to augmentation. Saphenofemoral Junction: No evidence of thrombus. Normal  compressibility and flow on color Doppler imaging. Profunda Femoral Vein: No evidence of thrombus. Normal compressibility and flow on color Doppler imaging. Femoral Vein: No evidence of thrombus. Normal compressibility, respiratory phasicity and response to augmentation. Popliteal Vein: No evidence of thrombus. Normal compressibility, respiratory phasicity and response to augmentation. Calf Veins: No evidence of thrombus. Normal compressibility and flow on color Doppler imaging. Superficial Great Saphenous Vein: No evidence of thrombus. Normal compressibility. Venous Reflux:  None. Other Findings:  Subcutaneous edema noted. LEFT LOWER EXTREMITY Common Femoral Vein: No evidence of thrombus. Normal compressibility, respiratory phasicity and response to augmentation. Saphenofemoral Junction: No evidence of thrombus. Normal compressibility and flow on color Doppler imaging. Profunda Femoral Vein: No evidence of thrombus. Normal compressibility and flow on color Doppler imaging. Femoral Vein: No evidence of thrombus. Normal compressibility, respiratory phasicity and response to augmentation. Popliteal Vein: No evidence of thrombus. Normal compressibility, respiratory phasicity and response to augmentation. Calf Veins: No evidence of thrombus. Normal compressibility and flow on color Doppler imaging. Superficial Great Saphenous Vein: No evidence of thrombus. Normal compressibility. Venous Reflux:  None. Other Findings:  Subcutaneous edema evident. IMPRESSION: Negative for significant lower extremity DVT in either leg. Peripheral subcutaneous edema noted bilaterally. Electronically Signed   By: Judie Petit.   Shick M.D.   On: 07/08/2017 17:05    ekg afib at 70, nl axis, t inversion in v3-6    Assessment & Plan:    Principal Problem:   Edema Active Problems:   Diabetes mellitus, insulin dependent (IDDM), uncontrolled (HCC)   CHF (congestive heart failure) (HCC)    Edema Check urinalysis r/o proteinuria/ ie nephrotic syndrome Check tsh Check echo  CHF w Bialteral pleural effusions Tele Trop I q6h x3 Check cardiac echo Start lasix 40mg  iv qday Cont lisinopril 40mg  poq day Decrease atenolol to 25mg  po qday (due to bradycardia)  CAD s/p CABG Cont Aspirin Cont Lipitor 20mg  po qhs Cont Atenolol, see above Cont Lisniopril , see above  Dm2 fsbs ac and qhs, ISS Cont lantus  Severe protein calorie malnutrition prostat 30mL po bid  Gout Cont allopurinol 100mg  po qday    DVT Prophylaxis Lovenox  SCDs  AM Labs Ordered, also please review Full Orders  Family Communication: Admission, patients condition and plan of care including tests being ordered have been discussed with the patient  who indicate understanding and agree with the plan and Code Status.  Code Status  FULL CODE  Likely DC to  home  Condition GUARDED    Consults called:  none  Admission status: inpatient  Time spent in minutes : 45   Pearson Grippe M.D on 07/08/2017 at 7:05 PM  Between 7am to 7pm - Pager - [phone]  . After 7pm go to [email or web address] - password [facility]  [facility] - Office  [phone]

## 2017-07-09 ENCOUNTER — Ambulatory Visit: Payer: Medicare Other | Admitting: Family Medicine

## 2017-07-09 ENCOUNTER — Other Ambulatory Visit: Payer: Self-pay

## 2017-07-09 ENCOUNTER — Other Ambulatory Visit (HOSPITAL_COMMUNITY): Payer: Medicare Other

## 2017-07-09 DIAGNOSIS — N186 End stage renal disease: Secondary | ICD-10-CM

## 2017-07-09 DIAGNOSIS — Z992 Dependence on renal dialysis: Secondary | ICD-10-CM

## 2017-07-09 DIAGNOSIS — E1122 Type 2 diabetes mellitus with diabetic chronic kidney disease: Secondary | ICD-10-CM

## 2017-07-09 LAB — COMPREHENSIVE METABOLIC PANEL
ALBUMIN: 2.2 g/dL — AB (ref 3.5–5.0)
ALK PHOS: 101 U/L (ref 38–126)
ALT: 18 U/L (ref 17–63)
AST: 21 U/L (ref 15–41)
Anion gap: 5 (ref 5–15)
BILIRUBIN TOTAL: 0.4 mg/dL (ref 0.3–1.2)
BUN: 19 mg/dL (ref 6–20)
CALCIUM: 7.8 mg/dL — AB (ref 8.9–10.3)
CO2: 31 mmol/L (ref 22–32)
CREATININE: 1.02 mg/dL (ref 0.61–1.24)
Chloride: 101 mmol/L (ref 101–111)
GFR calc Af Amer: >60 mL/min (ref >60)
GLUCOSE: 117 mg/dL — AB (ref 65–99)
Potassium: 4 mmol/L (ref 3.5–5.1)
Sodium: 137 mmol/L (ref 135–145)
Total Protein: 5.7 g/dL — ABNORMAL LOW (ref 6.5–8.1)

## 2017-07-09 LAB — GLUCOSE, CAPILLARY
GLUCOSE-CAPILLARY: 92 mg/dL (ref 65–99)
GLUCOSE-CAPILLARY: 64 mg/dL — AB (ref 65–99)
GLUCOSE-CAPILLARY: 114 mg/dL — AB (ref 65–99)
GLUCOSE-CAPILLARY: 127 mg/dL — AB (ref 65–99)
Glucose-Capillary: 124 mg/dL — ABNORMAL HIGH (ref 65–99)
Glucose-Capillary: 131 mg/dL — ABNORMAL HIGH (ref 65–99)
Glucose-Capillary: 117 mg/dL — ABNORMAL HIGH (ref 65–99)

## 2017-07-09 LAB — TSH: TSH: 3.67 u[IU]/mL (ref 0.350–4.500)

## 2017-07-09 LAB — URINALYSIS, ROUTINE W REFLEX MICROSCOPIC
BILIRUBIN URINE: NEGATIVE
GLUCOSE, UA: NEGATIVE mg/dL
Ketones, ur: NEGATIVE mg/dL
LEUKOCYTES UA: NEGATIVE
NITRITE: NEGATIVE
Specific Gravity, Urine: 1.017 (ref 1.005–1.030)
pH: 5 (ref 5.0–8.0)

## 2017-07-09 LAB — CBC
HEMATOCRIT: 37.8 % — AB (ref 39.0–52.0)
HEMOGLOBIN: 12.4 g/dL — AB (ref 13.0–17.0)
MCH: 32.5 pg (ref 26.0–34.0)
MCHC: 32.8 g/dL (ref 30.0–36.0)
MCV: 99 fL (ref 78.0–100.0)
Platelets: 185 10*3/uL (ref 150–400)
RBC: 3.82 MIL/uL — ABNORMAL LOW (ref 4.22–5.81)
RDW: 16 % — ABNORMAL HIGH (ref 11.5–15.5)
WBC: 5.8 10*3/uL (ref 4.0–10.5)

## 2017-07-09 LAB — TROPONIN I: Troponin I: <0.03 ng/mL (ref <0.03)

## 2017-07-09 MED ORDER — ISOSORB DINITRATE-HYDRALAZINE 20-37.5 MG PO TABS
1.0000 | ORAL_TABLET | Freq: Two times a day (BID) | ORAL | Status: DC
  Administered 2017-07-09 – 2017-07-14 (×10): 1 via ORAL
  Filled 2017-07-09 (×14): qty 1

## 2017-07-09 NOTE — Care Management Note (Signed)
Case Management Note  Patient Details  Name: Chad [name] MRN: [id number] Date of Birth: 21-Feb-1931  Subjective/Objective:      Admitted with CHF. Pt sitting up in chair, has guests at bedside so visit kept brief. Pt from home, lives alone, ind with ADL's. Communicates no concerns or needs at this time.              Action/Plan: CM will meet with pt again prior to DC. No needs anticipated.   Expected Discharge Date:   07/10/2017               Expected Discharge Plan:  Home/Self Care  In-House Referral:  NA  Discharge planning Services  CM Consult  Post Acute Care Choice:  NA Choice offered to:  NA  DME Arranged:    DME Agency:     HH Arranged:    HH Agency:     Status of Service:  In process, will continue to follow  If discussed at Long Length of Stay Meetings, dates discussed:    Additional Comments:  Malcolm MetroChildress, Lyrick Worland Demske, RN 07/09/2017, 3:26 PM

## 2017-07-09 NOTE — Progress Notes (Signed)
PROGRESS NOTE    Chad [name]  ZOX:[id number] DOB: [birth year] DOA: 07/08/2017 PCP: Ernestina Penna, MD     Brief Narrative:  84 y.o. male, w hypertension, hyperlipidemia , Pafib, CAD s/p CABG, CVA, Dm2, presents withc/o edema worsening for the past 4 days. No recent adjustment to medication. Pt denies fever, chills, cough, cp, palp, n/v, diarrhea, brbpr, black stool.   In Ed,  CXR IMPRESSION: 1. Moderate right and small left pleural effusions. 2. Interstitial edema.   Assessment & Plan: 1-acute CHF exacerbation: Presumably diastolic in nature. -Follow 2D echo, pending at this time -Troponins has been negative -Follow daily weights, low-sodium diet and strict intake and output -patient is 2.3L neg since admission  -cardiology consulted -b-blocker disocntinue due to bradycardia -bidil started -continue lisinopril   2-LE swelling  -due to #1 -will place ted hoses -continue IV diuresis  3-type 2 diabetes: with no apparent complications  -will use SSI -follow A1C  4-HLD -continue statins   5-HTN -still elevated -bidil started -continue lisinopril -now on IV lasix -follow VS  6-chronic A. Fib -CHADsVASC score 2-3 -was not on anticoagulation  -continue ASA -experiencing tachy-brady syndrome -cardiology consulted   DVT prophylaxis: Lovenox Code Status: Full code Family Communication: Daughter at bedside Disposition Plan: Continue IV diuresis, cardiology consultation, follow 2D echo, discontinue beta-blockers in the setting of tachybradycardia syndrome; start tx with Bidil; follow daily weights and strict intake/output.   Consultants:   Cardiology   Procedures:  2D echo: Pending Lower extremity duplex: Negative for DVT.  Antimicrobials:  Anti-infectives (From admission, onward)   None     Subjective: Afebrile, no chest pain, no nausea or vomiting.  Patient reports orthopnea and lower extremity swelling.  Objective: Vitals:   07/08/17 2208  07/09/17 0519 07/09/17 0948 07/09/17 1425  BP:  138/80 (!) 157/55 (!) 149/85  Pulse:  65 (!) 47 (!) 52  Resp:  17 18 18   Temp:  98.2 F (36.8 C)  97.6 F (36.4 C)  TempSrc:  Oral  Oral  SpO2: 95% 97% 98% 99%  Weight:  71.6 kg (157 lb 13.6 oz)    Height:        Intake/Output Summary (Last 24 hours) at 07/09/2017 1815 Last data filed at 07/09/2017 1721 Gross per 24 hour  Intake 480 ml  Output 2600 ml  Net -2120 ml   Filed Weights   07/08/17 1402 07/08/17 2017 07/09/17 0519  Weight: 65.8 kg (145 lb) 71 kg (156 lb 9.6 oz) 71.6 kg (157 lb 13.6 oz)    Examination: General exam: Alert, awake, oriented x 3; hard of hearing.  Denying chest pain and mainly complaining of lower extremity swelling and orthopnea.  Patient also expressed intermittent episode of shortness of breath with exertion, something that he has noticed in the last 1-2 weeks prior to admission. Respiratory system: No wheezing, positive bibasilar crackles, tachypneic (especially with exertion), no using accessory muscles, no requiring oxygen supplementation.  Cardiovascular system: Irregular, no murmurs, no gallops, no rubs, mild JVD appreciated.   Gastrointestinal system: Abdomen is nondistended, soft and nontender. No organomegaly or masses felt. Normal bowel sounds heard. Central nervous system: Alert and oriented. No focal neurological deficits.  Cranial nerve grossly intact. Extremities: No no cyanosis or clubbing; patient with 1-2+ edema bilaterally extending up to mid thighs. Skin: No petechiae, no rashes, no open wounds. Psychiatry: Judgement and insight appear normal. Mood & affect appropriate.   Data Reviewed: I have personally reviewed following labs and imaging studies  CBC:  Recent Labs  Lab 07/08/17 1440 07/09/17 0715  WBC 6.5 5.8  NEUTROABS 3.8  --   HGB 13.5 12.4*  HCT 41.2 37.8*  MCV 99.3 99.0  PLT 208 185   Basic Metabolic Panel: Recent Labs  Lab 07/08/17 1440 07/09/17 0715  NA 137 137  K  3.5 4.0  CL 100* 101  CO2 28 31  GLUCOSE 157* 117*  BUN 16 19  CREATININE 1.14 1.02  CALCIUM 8.2* 7.8*   GFR: Estimated Creatinine Clearance: 49 mL/min (by C-G formula based on SCr of 1.02 mg/dL). Liver Function Tests: Recent Labs  Lab 07/08/17 1541 07/09/17 0715  AST 22 21  ALT 19 18  ALKPHOS 121 101  BILITOT 0.8 0.4  PROT 6.2* 5.7*  ALBUMIN 2.4* 2.2*   Cardiac Enzymes: Recent Labs  Lab 07/08/17 1440 07/08/17 1939 07/09/17 0120 07/09/17 0715  TROPONINI <0.03 <0.03 <0.03 <0.03   CBG: Recent Labs  Lab 07/09/17 0515 07/09/17 0734 07/09/17 1136 07/09/17 1521 07/09/17 1615  GLUCAP 64* 124* 131* 114* 127*   Thyroid Function Tests: Recent Labs    07/08/17 1939  TSH 3.670   Anemia Panel: No results for input(s): VITAMINB12, FOLATE, FERRITIN, TIBC, IRON, RETICCTPCT in the last 72 hours.   Urine analysis:    Component Value Date/Time   COLORURINE YELLOW 07/09/2017 0720   APPEARANCEUR CLEAR 07/09/2017 0720   LABSPEC 1.017 07/09/2017 0720   PHURINE 5.0 07/09/2017 0720   GLUCOSEU NEGATIVE 07/09/2017 0720   HGBUR SMALL (A) 07/09/2017 0720   BILIRUBINUR NEGATIVE 07/09/2017 0720   BILIRUBINUR negative 04/13/2013 1030   KETONESUR NEGATIVE 07/09/2017 0720   PROTEINUR >300 (A) 07/09/2017 0720   UROBILINOGEN negative 04/13/2013 1030   UROBILINOGEN 0.2 08/12/2009 2205   NITRITE NEGATIVE 07/09/2017 0720   LEUKOCYTESUR NEGATIVE 07/09/2017 0720    Radiology Studies: Dg Chest 2 View  Result Date: 07/08/2017 CLINICAL DATA:  84 y/o  M; lower leg and lower abdominal edema. EXAM: CHEST - 2 VIEW COMPARISON:  11/08/2016 chest radiograph. FINDINGS: Normal cardiac silhouette given projection and technique. Post median sternotomy and CABG. Calcific atherosclerosis of aorta. Moderate right and small left pleural effusions. Reticular opacities of the lungs probably representing interstitial edema. No acute osseous abnormality is evident. IMPRESSION: 1. Moderate right and small  left pleural effusions. 2. Interstitial edema. 3. Aortic atherosclerosis. Electronically Signed   By: Mitzi Hansen M.D.   On: 07/08/2017 15:02   US Venous Img Lower Bilateral  Result Date: 07/08/2017 CLINICAL DATA:  Bilateral lower extremity edema right greater than EXAM: BILATERAL LOWER EXTREMITY VENOUS DOPPLER ULTRASOUND TECHNIQUE: Gray-scale sonography with graded compression, as well as color Doppler and duplex ultrasound were performed to evaluate the lower extremity deep venous systems from the level of the common femoral vein and including the common femoral, femoral, profunda femoral, popliteal and calf veins including the posterior tibial, peroneal and gastrocnemius veins when visible. The superficial great saphenous vein was also interrogated. Spectral Doppler was utilized to evaluate flow at rest and with distal augmentation maneuvers in the common femoral, femoral and popliteal veins. COMPARISON:  None. FINDINGS: RIGHT LOWER EXTREMITY Common Femoral Vein: No evidence of thrombus. Normal compressibility, respiratory phasicity and response to augmentation. Saphenofemoral Junction: No evidence of thrombus. Normal compressibility and flow on color Doppler imaging. Profunda Femoral Vein: No evidence of thrombus. Normal compressibility and flow on color Doppler imaging. Femoral Vein: No evidence of thrombus. Normal compressibility, respiratory phasicity and response to augmentation. Popliteal Vein: No evidence of thrombus. Normal compressibility, respiratory phasicity  and response to augmentation. Calf Veins: No evidence of thrombus. Normal compressibility and flow on color Doppler imaging. Superficial Great Saphenous Vein: No evidence of thrombus. Normal compressibility. Venous Reflux:  None. Other Findings:  Subcutaneous edema noted. LEFT LOWER EXTREMITY Common Femoral Vein: No evidence of thrombus. Normal compressibility, respiratory phasicity and response to augmentation. Saphenofemoral  Junction: No evidence of thrombus. Normal compressibility and flow on color Doppler imaging. Profunda Femoral Vein: No evidence of thrombus. Normal compressibility and flow on color Doppler imaging. Femoral Vein: No evidence of thrombus. Normal compressibility, respiratory phasicity and response to augmentation. Popliteal Vein: No evidence of thrombus. Normal compressibility, respiratory phasicity and response to augmentation. Calf Veins: No evidence of thrombus. Normal compressibility and flow on color Doppler imaging. Superficial Great Saphenous Vein: No evidence of thrombus. Normal compressibility. Venous Reflux:  None. Other Findings:  Subcutaneous edema evident. IMPRESSION: Negative for significant lower extremity DVT in either leg. Peripheral subcutaneous edema noted bilaterally. Electronically Signed   By: Judie PetitM.  Shick M.D.   On: 07/08/2017 17:05    Scheduled Meds: . allopurinol  100 mg Oral Daily  . aspirin  81 mg Oral Daily  . atorvastatin  20 mg Oral q1800  . cholecalciferol  2,000 Units Oral Daily  . enoxaparin (LOVENOX) injection  40 mg Subcutaneous Q24H  . feeding supplement (ENSURE ENLIVE)  237 mL Oral BID BM  . feeding supplement (PRO-STAT SUGAR FREE 64)  30 mL Oral BID  . furosemide  40 mg Intravenous Daily  . insulin aspart  0-5 Units Subcutaneous QHS  . insulin aspart  0-9 Units Subcutaneous TID WC  . insulin glargine  10 Units Subcutaneous Q2200  . isosorbide-hydrALAZINE  1 tablet Oral BID  . lisinopril  40 mg Oral Daily  . omega-3 acid ethyl esters  1 g Oral Daily  . sodium chloride flush  3 mL Intravenous Q12H   Continuous Infusions: . sodium chloride       LOS: 1 day    Time spent: 35. Greater than 50% of this time was spent in direct contact with the patient, coordinating care and discussing relevant ongoing clinical issues, including discussion of CHF, explanation about why legs were swollen, education about need for decrease sodium intake and update to family member  at bedside on plan of care.Vassie Loll.     Haven Foss, MD [facility] Pager [phone]  If 7PM-7AM, please contact night-coverage [email or web address] Password [email or web address] 07/09/2017, 6:15 PM

## 2017-07-09 NOTE — Progress Notes (Signed)
Initial Nutrition Assessment  DOCUMENTATION CODES:   Not applicable  INTERVENTION:   Monitor intake; pt's intake is good and does not have wt loss.  Continue Pro-Stat and Enlive.  NUTRITION DIAGNOSIS:   Increased nutrient needs related to chronic illness(heart failure) as evidenced by estimated needs.  GOAL:   Patient will meet greater than or equal to 90% of their needs  MONITOR:   PO intake, Supplement acceptance, Skin  REASON FOR ASSESSMENT:   Malnutrition Screening Tool   ASSESSMENT:  84 y/o male with Hx of CAD, DM2, HLD, HTN, neuropathy, stroke.  Admitted for edema in legs.  MST score of 2.    Asked pt about poor appetite reported from MST score and pt said he has had no problems with hunger.  In fact, his lunch tray came while I was in the room and, according to intake records, he ate 80% of it.  Pt has been eating 75-80% of all meals and is currently on a heart healthy/CHO mod diet.  At home, pt manages his DM2 with his diet and says that a morning blood sugar is usually around 160.  Pt does not take any vitamins or supplements at home and denies any nausea, vomiting, constipation, or diarrhea.    Pt also said he has had no changes in wt and recorded wt hx confirms this.  Pt's wt has been stable in between 140s and 150s lbs for the past 2 years.  Deferred NFPE due to no apparent concerns in appetite or weight changes and due to the fact that pt had started to eat lunch.    No immediate concerns.  Continue Pro-Stat and Ensure Enlive.    Labs reviewed: Glucose 117, albumin 2.2 Recent Labs  Lab 07/08/17 1440 07/09/17 0715  NA 137 137  K 3.5 4.0  CL 100* 101  CO2 28 31  BUN 16 19  CREATININE 1.14 1.02  CALCIUM 8.2* 7.8*  GLUCOSE 157* 117*   Medications: Zyloprim, vitamin D, lovenox, ensure, lasix, insulin, prinivil, omega-3, saline   Past Medical History:  Diagnosis Date  . 1st degree AV block   . Atrial fibrillation (HCC)   . Diabetes mellitus without  complication (HCC)   . Gout   . Hyperlipidemia   . Hypertension   . Neuropathy   . Stroke [facility](HCC)    NUTRITION - FOCUSED PHYSICAL EXAM:  Deferred due to no pressing concerns and pt was busy eating lunch.  Diet Order:   Diet Order           Diet heart healthy/carb modified Room service appropriate? Yes; Fluid consistency: Thin  Diet effective now         EDUCATION NEEDS:   No education needs have been identified at this time  Skin:  Skin Assessment: Skin Integrity Issues: Skin Integrity Issues:: Other (Comment) Other: Blister: right/medial/lower leg.  Red, serous.  Last BM:  6/17 (last recorded)  Height:   Ht Readings from Last 1 Encounters:  07/08/17 5\' 4"  (1.626 m)   Weight:   Wt Readings from Last 1 Encounters:  07/09/17 157 lb 13.6 oz (71.6 kg)   Ideal Body Weight:  59 kg  BMI:  Body mass index is 27.09 kg/m.  Estimated Nutritional Needs:   Kcal:  1500-1720 kcal (21-24 kcal/kg BW)  Protein:  75-86 g (20% kcal)  Fluid:  >1800 mL (25 mL/kg BW)

## 2017-07-10 ENCOUNTER — Encounter (HOSPITAL_COMMUNITY): Payer: Self-pay | Admitting: Student

## 2017-07-10 ENCOUNTER — Inpatient Hospital Stay (HOSPITAL_COMMUNITY): Payer: Medicare Other

## 2017-07-10 DIAGNOSIS — I340 Nonrheumatic mitral (valve) insufficiency: Secondary | ICD-10-CM

## 2017-07-10 DIAGNOSIS — I509 Heart failure, unspecified: Secondary | ICD-10-CM

## 2017-07-10 DIAGNOSIS — I25708 Atherosclerosis of coronary artery bypass graft(s), unspecified, with other forms of angina pectoris: Secondary | ICD-10-CM

## 2017-07-10 DIAGNOSIS — I482 Chronic atrial fibrillation, unspecified: Secondary | ICD-10-CM

## 2017-07-10 DIAGNOSIS — I10 Essential (primary) hypertension: Secondary | ICD-10-CM

## 2017-07-10 DIAGNOSIS — E1065 Type 1 diabetes mellitus with hyperglycemia: Secondary | ICD-10-CM

## 2017-07-10 DIAGNOSIS — Z9111 Patient's noncompliance with dietary regimen: Secondary | ICD-10-CM

## 2017-07-10 DIAGNOSIS — I5023 Acute on chronic systolic (congestive) heart failure: Secondary | ICD-10-CM

## 2017-07-10 DIAGNOSIS — E785 Hyperlipidemia, unspecified: Secondary | ICD-10-CM

## 2017-07-10 DIAGNOSIS — R600 Localized edema: Secondary | ICD-10-CM

## 2017-07-10 DIAGNOSIS — I361 Nonrheumatic tricuspid (valve) insufficiency: Secondary | ICD-10-CM

## 2017-07-10 DIAGNOSIS — R0601 Orthopnea: Secondary | ICD-10-CM

## 2017-07-10 LAB — GLUCOSE, CAPILLARY
GLUCOSE-CAPILLARY: 237 mg/dL — AB (ref 65–99)
GLUCOSE-CAPILLARY: 159 mg/dL — AB (ref 65–99)
Glucose-Capillary: 113 mg/dL — ABNORMAL HIGH (ref 65–99)
Glucose-Capillary: 185 mg/dL — ABNORMAL HIGH (ref 65–99)
Glucose-Capillary: 187 mg/dL — ABNORMAL HIGH (ref 65–99)

## 2017-07-10 LAB — BASIC METABOLIC PANEL
ANION GAP: 5 (ref 5–15)
BUN: 20 mg/dL (ref 6–20)
CALCIUM: 7.9 mg/dL — AB (ref 8.9–10.3)
CHLORIDE: 102 mmol/L (ref 101–111)
CO2: 31 mmol/L (ref 22–32)
Creatinine, Ser: 0.95 mg/dL (ref 0.61–1.24)
GFR calc non Af Amer: >60 mL/min (ref >60)
Glucose, Bld: 164 mg/dL — ABNORMAL HIGH (ref 65–99)
Potassium: 3.7 mmol/L (ref 3.5–5.1)
Sodium: 138 mmol/L (ref 135–145)

## 2017-07-10 LAB — ECHOCARDIOGRAM COMPLETE
HEIGHTINCHES: 64 in
WEIGHTICAEL: 2472.68 [oz_av]

## 2017-07-10 MED ORDER — DIPHENHYDRAMINE HCL 25 MG PO CAPS
25.0000 mg | ORAL_CAPSULE | Freq: Every evening | ORAL | Status: DC | PRN
  Administered 2017-07-10 – 2017-07-13 (×4): 25 mg via ORAL
  Filled 2017-07-10 (×4): qty 1

## 2017-07-10 MED ORDER — ZOLPIDEM TARTRATE 5 MG PO TABS: 5.0000 mg | ORAL_TABLET | Freq: Every evening | ORAL | Status: DC | PRN

## 2017-07-10 MED ORDER — FUROSEMIDE 10 MG/ML IJ SOLN
40.0000 mg | Freq: Two times a day (BID) | INTRAMUSCULAR | Status: AC
  Administered 2017-07-10 – 2017-07-13 (×8): 40 mg via INTRAVENOUS
  Filled 2017-07-10 (×8): qty 4

## 2017-07-10 NOTE — Consult Note (Addendum)
Cardiology Consult    Patient ID: Chad [name]; [id number]; 1931-04-02   Admit date: 07/08/2017 Date of Consult: 07/10/2017  Primary Care Provider: Ernestina Penna, MD Primary Cardiologist: Rollene Rotunda, MD   Patient Profile    Chad [name] is a 84 y.o. male with past medical history of CAD (s/p prior CABG in the 1980's), chronic atrial fibrillation (no longer on anticoagulation), HTN, HLD, IDDM, and prior CVA who is being seen today for the evaluation of CHF and atrial fibrillation at the request of Dr. Gwenlyn Perking.   History of Present Illness    Chad [name] was last evaluated by Dr. Antoine Poche in 07/2013 and denied any recent chest discomfort or dyspnea on exertion at that time. He was started on Eliquis 2.5 mg twice daily at that time for anticoagulation in the setting of known atrial fibrillation. It does not appear he has followed-up with Cardiology since.   He presented to [facility] ED on 07/08/2017 for evaluation of worsening lower extremity edema over the past week. He reports having chronic edema along his right lower extremity ever since bypass surgery in the 1980's but says his symptoms worsened over the past week. He reports associated orthopnea but denies any PND or dyspnea on exertion. No recent chest discomfort or palpitations. No recent lightheadedness, dizziness, or presyncope. He reports being active for his age as he performs yard work regularly and has a garden. He does eat at [facility] on a daily basis as he says this is easier than cooking for himself. He is no longer on anticoagulation for atrial fibrillation and by review of records this was discontinued in 2015 by his PCP as he was unable to afford Eliquis and declined Coumadin at that time. Reports that most of his medications are managed by his daughter who is a Engineer, civil (consulting).  Initial labs showed WBC 6.5, Hgb 13.5, platelets 208, Na+ 137, K+ 3.5, and creatinine 1.14.  BNP elevated to 1752.  TSH 3.670.  Initial and  cyclic troponin values have been negative. EKG shows rate controlled atrial fibrillation, heart rate 69, and IVCD (similar to prior tracings). CXR showed moderate right and small left pleural effusions with interstitial edema. Lower extremity dopplers were obtained and negative for DVT.  He was started on IV Lasix 40 mg daily at the time of admission and Atenolol was decreased from 50 mg daily to 25 mg daily given his baseline bradycardia. This has since been fully discontinued as of 07/09/2017. He is overall -2.3 L since the time of admission and weight has declined by 2 pounds.   Past Medical History:  Diagnosis Date  . 1st degree AV block   . Atrial fibrillation (HCC)   . CAD (coronary artery disease)    a. s/p CABG in 1980's (patient unsure of exact year or grafts)  . Diabetes mellitus without complication (HCC)   . Gout   . Hyperlipidemia   . Hypertension   . Neuropathy   . Stroke [facility])     Past Surgical History:  Procedure Laterality Date  . carpel tunnel Bilateral   . CORONARY ARTERY BYPASS GRAFT     heart- x 6  . KIDNEY STONE SURGERY    . PARATHYROIDECTOMY       Home Medications:  Prior to Admission medications   Medication Sig Start Date End Date Taking? Authorizing Provider  allopurinol (ZYLOPRIM) 100 MG tablet Take 100 mg by mouth daily.   Yes [provider]  aspirin 81 MG  tablet Take 81 mg by mouth daily.   Yes [provider]  atenolol (TENORMIN) 50 MG tablet TAKE 1 TABLET BY MOUTH ONCE DAILY 06/11/17  Yes Ernestina PennaMoore, Donald W, MD  atorvastatin (LIPITOR) 20 MG tablet TAKE 1 TABLET BY MOUTH ONCE DAILY AT  6  PM 04/24/17  Yes Ernestina PennaMoore, Donald W, MD  Cholecalciferol (VITAMIN D3) 2000 units TABS Take 2,000 Units by mouth daily.   Yes [provider]  fish oil-omega-3 fatty acids 1000 MG capsule Take 1 g by mouth daily.   Yes [provider]  furosemide (LASIX) 20 MG tablet TAKE 2 TABLETS BY MOUTH ONCE DAILY Patient taking differently: TAKE 1  TABLET BY MOUTH ONCE DAILY 05/02/17  Yes Ernestina PennaMoore, Donald W, MD  Insulin Glargine (LANTUS SOLOSTAR) 100 UNIT/ML Solostar Pen Inject 10 Units into the skin daily at 10 pm. 10/16/16  Yes Gottschalk, Ashly M, DO  lisinopril (PRINIVIL,ZESTRIL) 40 MG tablet TAKE 1 TABLET BY MOUTH ONCE DAILY 05/02/17  Yes Ernestina PennaMoore, Donald W, MD  Probiotic Product (PROBIOTIC DAILY PO) Take 1 capsule by mouth daily.   Yes [provider]  glucose blood (ONE TOUCH ULTRA TEST) test strip Test Tid. Dx E11.9 10/16/16   Delynn FlavinGottschalk, Ashly M, DO  glucose blood (ONE TOUCH ULTRA TEST) test strip USE TO CHECK GLUCOSE THREE TIMES DAILY 02/28/17   Ernestina PennaMoore, Donald W, MD  Insulin Pen Needle (PEN NEEDLES) 32G X 4 MM MISC 1 each by Does not apply route daily. Use with pen to inject insulin QD (Dx: E10.65, E10.8 - IDDM, uncontrolled) 11/09/15   Ernestina PennaMoore, Donald W, MD  Lancets [facility](ONETOUCH ULTRASOFT) lancets Test TId. DX E11.9 10/16/16   Delynn FlavinGottschalk, Ashly M, DO  LANTUS SOLOSTAR 100 UNIT/ML Solostar Pen INJECT 40 UNITS SUBCUTANEOUSLY AT BEDTIME Patient not taking: Reported on 07/08/2017 01/31/17   Ernestina PennaMoore, Donald W, MD    Inpatient Medications: Scheduled Meds: . allopurinol  100 mg Oral Daily  . aspirin  81 mg Oral Daily  . atorvastatin  20 mg Oral q1800  . cholecalciferol  2,000 Units Oral Daily  . enoxaparin (LOVENOX) injection  40 mg Subcutaneous Q24H  . feeding supplement (ENSURE ENLIVE)  237 mL Oral BID BM  . feeding supplement (PRO-STAT SUGAR FREE 64)  30 mL Oral BID  . furosemide  40 mg Intravenous BID  . insulin aspart  0-5 Units Subcutaneous QHS  . insulin aspart  0-9 Units Subcutaneous TID WC  . insulin glargine  10 Units Subcutaneous Q2200  . isosorbide-hydrALAZINE  1 tablet Oral BID  . lisinopril  40 mg Oral Daily  . omega-3 acid ethyl esters  1 g Oral Daily  . sodium chloride flush  3 mL Intravenous Q12H   Continuous Infusions: . sodium chloride     PRN Meds: sodium chloride, acetaminophen **OR** acetaminophen, sodium chloride  flush  Allergies:   No Known Allergies  Social History:   Social History   Socioeconomic History  . Marital status: Married    Spouse name: Not on file  . Number of children: 1  . Years of education: Not on file  . Highest education level: Not on file  Occupational History  . Not on file  Social Needs  . Financial resource strain: Not on file  . Food insecurity:    Worry: Not on file    Inability: Not on file  . Transportation needs:    Medical: Not on file    Non-medical: Not on file  Tobacco Use  . Smoking status: Former  Smoker    Types: Cigarettes    Last attempt to quit: 06/12/1977    Years since quitting: 40.1  . Smokeless tobacco: Never Used  Substance and Sexual Activity  . Alcohol use: No  . Drug use: No  . Sexual activity: Not on file  Lifestyle  . Physical activity:    Days per week: Not on file    Minutes per session: Not on file  . Stress: Not on file  Relationships  . Social connections:    Talks on phone: Not on file    Gets together: Not on file    Attends religious service: Not on file    Active member of club or organization: Not on file    Attends meetings of clubs or organizations: Not on file    Relationship status: Not on file  . Intimate partner violence:    Fear of current or ex partner: Not on file    Emotionally abused: Not on file    Physically abused: Not on file    Forced sexual activity: Not on file  Other Topics Concern  . Not on file  Social History Narrative  . Not on file     Family History:    Family History  Problem Relation Age of Onset  . Cancer Father        throat  . CAD Brother 57       MI questionable, sudden death  . CAD Daughter 46       MI, stent      Review of Systems    General:  No chills, fever, night sweats or weight changes.  Cardiovascular:  No chest pain, dyspnea on exertion, palpitations, paroxysmal nocturnal dyspnea. Positive for edema and orthopnea.  Dermatological: No rash,  lesions/masses Respiratory: No cough, dyspnea Urologic: No hematuria, dysuria Abdominal:   No nausea, vomiting, diarrhea, bright red blood per rectum, melena, or hematemesis Neurologic:  No visual changes, wkns, changes in mental status. All other systems reviewed and are otherwise negative except as noted above.  Physical Exam/Data    Vitals:   07/09/17 1425 07/09/17 1940 07/09/17 2125 07/10/17 0603  BP: (!) 149/85  (!) 151/60 128/62  Pulse: (!) 52  (!) 47 61  Resp: 18  16 14   Temp: 97.6 F (36.4 C)  98.2 F (36.8 C) 97.7 F (36.5 C)  TempSrc: Oral  Oral Oral  SpO2: 99% 95% 94% 98%  Weight:    154 lb 8.7 oz (70.1 kg)  Height:        Intake/Output Summary (Last 24 hours) at 07/10/2017 1610 Last data filed at 07/10/2017 0600 Gross per 24 hour  Intake 480 ml  Output 2200 ml  Net -1720 ml   Filed Weights   07/08/17 2017 07/09/17 0519 07/10/17 0603  Weight: 156 lb 9.6 oz (71 kg) 157 lb 13.6 oz (71.6 kg) 154 lb 8.7 oz (70.1 kg)   Body mass index is 26.53 kg/m.   General: Pleasant, elderly Caucasian male appearing in NAD Psych: Normal affect. Neuro: Alert and oriented X 3. Moves all extremities spontaneously. HEENT: Normal  Neck: Supple without bruits or JVD. Lungs:  Resp regular and unlabored, decreased breath sounds along bases bilaterally. Heart: Irregular irregular, no s3, s4, or murmurs. Abdomen: Soft, non-tender, non-distended, BS + x 4.  Extremities: No clubbing or cyanosis. 2+ pitting edema along RLE, 1+ along LLE. DP/PT/Radials 2+ and equal bilaterally.   EKG:  The EKG was personally reviewed and demonstrates: Atrial fibrillation, heart rate  69, and IVCD (similar to prior tracings).   Telemetry:  Telemetry was personally reviewed and demonstrates: Atrial fibrillation, HR in mid-40's to 50's. 8 beats NSVT.    Labs/Studies     Relevant CV Studies:  Echocardiogram: Pending  Laboratory Data:  Chemistry Recent Labs  Lab 07/08/17 1440 07/09/17 0715  NA  137 137  K 3.5 4.0  CL 100* 101  CO2 28 31  GLUCOSE 157* 117*  BUN 16 19  CREATININE 1.14 1.02  CALCIUM 8.2* 7.8*  GFRNONAA 57* >60  GFRAA >60 >60  ANIONGAP 9 5    Recent Labs  Lab 07/08/17 1541 07/09/17 0715  PROT 6.2* 5.7*  ALBUMIN 2.4* 2.2*  AST 22 21  ALT 19 18  ALKPHOS 121 101  BILITOT 0.8 0.4   Hematology Recent Labs  Lab 07/08/17 1440 07/09/17 0715  WBC 6.5 5.8  RBC 4.15* 3.82*  HGB 13.5 12.4*  HCT 41.2 37.8*  MCV 99.3 99.0  MCH 32.5 32.5  MCHC 32.8 32.8  RDW 16.0* 16.0*  PLT 208 185   Cardiac Enzymes Recent Labs  Lab 07/08/17 1440 07/08/17 1939 07/09/17 0120 07/09/17 0715  TROPONINI <0.03 <0.03 <0.03 <0.03   No results for input(s): TROPIPOC in the last 168 hours.  BNP Recent Labs  Lab 07/08/17 1440  BNP 1,752.0*    DDimer No results for input(s): DDIMER in the last 168 hours.  Radiology/Studies:  Dg Chest 2 View  Result Date: 07/08/2017 CLINICAL DATA:  84 y/o  M; lower leg and lower abdominal edema. EXAM: CHEST - 2 VIEW COMPARISON:  11/08/2016 chest radiograph. FINDINGS: Normal cardiac silhouette given projection and technique. Post median sternotomy and CABG. Calcific atherosclerosis of aorta. Moderate right and small left pleural effusions. Reticular opacities of the lungs probably representing interstitial edema. No acute osseous abnormality is evident. IMPRESSION: 1. Moderate right and small left pleural effusions. 2. Interstitial edema. 3. Aortic atherosclerosis. Electronically Signed   By: Mitzi Hansen M.D.   On: 07/08/2017 15:02   US Venous Img Lower Bilateral  Result Date: 07/08/2017 CLINICAL DATA:  Bilateral lower extremity edema right greater than EXAM: BILATERAL LOWER EXTREMITY VENOUS DOPPLER ULTRASOUND TECHNIQUE: Gray-scale sonography with graded compression, as well as color Doppler and duplex ultrasound were performed to evaluate the lower extremity deep venous systems from the level of the common femoral vein and  including the common femoral, femoral, profunda femoral, popliteal and calf veins including the posterior tibial, peroneal and gastrocnemius veins when visible. The superficial great saphenous vein was also interrogated. Spectral Doppler was utilized to evaluate flow at rest and with distal augmentation maneuvers in the common femoral, femoral and popliteal veins. COMPARISON:  None. FINDINGS: RIGHT LOWER EXTREMITY Common Femoral Vein: No evidence of thrombus. Normal compressibility, respiratory phasicity and response to augmentation. Saphenofemoral Junction: No evidence of thrombus. Normal compressibility and flow on color Doppler imaging. Profunda Femoral Vein: No evidence of thrombus. Normal compressibility and flow on color Doppler imaging. Femoral Vein: No evidence of thrombus. Normal compressibility, respiratory phasicity and response to augmentation. Popliteal Vein: No evidence of thrombus. Normal compressibility, respiratory phasicity and response to augmentation. Calf Veins: No evidence of thrombus. Normal compressibility and flow on color Doppler imaging. Superficial Great Saphenous Vein: No evidence of thrombus. Normal compressibility. Venous Reflux:  None. Other Findings:  Subcutaneous edema noted. LEFT LOWER EXTREMITY Common Femoral Vein: No evidence of thrombus. Normal compressibility, respiratory phasicity and response to augmentation. Saphenofemoral Junction: No evidence of thrombus. Normal compressibility and flow on color Doppler imaging.  Profunda Femoral Vein: No evidence of thrombus. Normal compressibility and flow on color Doppler imaging. Femoral Vein: No evidence of thrombus. Normal compressibility, respiratory phasicity and response to augmentation. Popliteal Vein: No evidence of thrombus. Normal compressibility, respiratory phasicity and response to augmentation. Calf Veins: No evidence of thrombus. Normal compressibility and flow on color Doppler imaging. Superficial Great Saphenous Vein: No  evidence of thrombus. Normal compressibility. Venous Reflux:  None. Other Findings:  Subcutaneous edema evident. IMPRESSION: Negative for significant lower extremity DVT in either leg. Peripheral subcutaneous edema noted bilaterally. Electronically Signed   By: Judie Petit.  Shick M.D.   On: 07/08/2017 17:05    Assessment & Plan     1. Acute CHF Exacerbation (Echo pending to determine systolic versus diastolic dysfunction) - Presented with worsening lower extremity edema and orthopnea over the past week. BNP elevated to 1752 and initial CXR showed moderate right and small left pleural effusions with interstitial edema. Echocardiogram is pending to assess for structural abnormalities.  - He has been started on IV Lasix 40 mg daily and is overall -2.3 L since the time of admission and weight has declined by 2 pounds. He still has significant lower extremity edema on examination and decreased breath sounds along the bases bilaterally. Will plan to titrate IV Lasix to 40 mg twice daily. Repeat BMET in AM. Continue to follow I's and O's along with daily weights. He was on PO Lasix 20 mg daily prior to admission. Will likely require titration to 40 mg daily at the time of discharge. Sodium restriction reviewed with the patient as he visits restaurants on a daily basis.   2. Chronic Atrial Fibrillation - This dates back to at least 2015 by review of notes and prior EKG's. He is overall asymptomatic with the arrhythmia and denies any recent palpitations. He has been bradycardia on telemetry with heart rate in the 40's to 50's and heart rate was previously in the 30's as noted on prior EKG's in 2018. Agree with discontinuation of PTA Atenolol.  -  This patients CHA2DS2-VASc Score and unadjusted Ischemic Stroke Rate (% per year) is equal to 11.2 % stroke rate/year from a score of 7 (HTN, DM, Vascular, Age (2), CVA (2)). Previously unable to afford NOAC and declined to be on Coumadin. Patient reports his daughter who is a  nurse manages his medications and he wishes to review with her further before starting anticoagulation. Remains on ASA at this time.   3. CAD - s/p prior CABG in the 1980's with no recent cardiac evaluation since. He denies any recent episodes of chest discomfort and is active at baseline. Cyclic troponin values have been negative and his EKG shows no acute ischemic changes. Echocardiogram is pending to assess LV function and wall motion. - Continue ASA and statin therapy. Beta-blocker has been discontinued due to baseline bradycardia.  4. HTN - BP has overall been well controlled and was at 128/62 on most recent check. Continue Lisinopril 40 mg daily. Was recently started on BiDil on 6/18 by the admitting team as BP was elevated with discontinuation of Atenolol. If EF is preserved by echo, would consider switching from BiDil to Amlodipine as this would be a more affordable option for the patient.   5. HLD - followed by PCP. Remains on Atorvastatin 20mg  daily.   6. IDDM - Hgb A1c at 7.8 in 09/2016. Per admitting team.    For questions or updates, please contact [facility] Please consult [email or web address] for contact info under Cardiology/STEMI.  Signed, Ellsworth Lennox, PA-C 07/10/2017, 8:22 AM Pager: [phone]  The patient was seen and examined, and I agree with the history, physical exam, assessment and plan as documented above, with modifications as noted below. I have also personally reviewed all relevant documentation, old records, labs, and both radiographic and cardiovascular studies. I have also independently interpreted old and new ECG's.  Briefly, this is an 84 year old male with a history of coronary artery disease and prior CABG in the 1980s, chronic atrial fibrillation, hypertension, hyperlipidemia, and prior CVA.  He last saw Dr. Antoine Poche in July 2015 and did not follow-up with a cardiologist since that time.  He told me he has been experiencing progressive bilateral  lower extremity swelling over the last 3 weeks.  He does have chronic right lower extremity edema since bypass surgery but his right leg increased in size over the past 3 weeks.  He also complains of shortness of breath when lying down.  He does say after several minutes those symptoms resolved.  He denies exertional chest pain.  He also denies palpitations and paroxysmal nocturnal dyspnea.  He eats fast food on a daily basis and does not cook for himself much.  He is not on anticoagulation as he could not afford Eliquis and declined the use of warfarin back in 2015.  At the present time, he wants to discuss systemic any coagulation further with his daughter who is a Engineer, civil (consulting).  He had been on atenolol but due to bradycardia this was discontinued yesterday.  He was given IV Lasix and thus far has put out over 2.3 L.  He still has bilateral lower extremity edema.  I reviewed the chest x-ray which showed moderate right and small left pleural effusions and interstitial edema.  Lower extreme Dopplers were negative for DVT.  Renal function is stable.  Troponins are normal.  CBC is unremarkable.  BNP elevated to 1752.  I personally reviewed the ECG which demonstrates rate controlled atrial fibrillation, nonspecific intraventricular conduction delay, and high lateral infarct which is old.  Recommendations: As he still has a significant amount of lower extremity edema, I agree with increasing Lasix to IV 40 mg twice daily.  His blood pressure is reasonably controlled.  I will review the follow-up echocardiogram already ordered to see if there has been a decline in left ventricular function.  With respect to ischemic heart disease, he is symptomatically stable.  Troponins have been normal.  Continue aspirin and statin therapy.  Atenolol has been discontinued due to bradycardia.  Given his history of prior stroke he has had a high thromboembolic risk and I would recommend systemic anticoagulation.  He  will discuss this further with his daughter.  He needs a sodium restricted diet at home to prevent recurrences.  Prentice Docker, MD, [facility]  07/10/2017 10:20 AM

## 2017-07-10 NOTE — Progress Notes (Signed)
PROGRESS NOTE  Chad [name] ZOX:[id number] DOB: November 23, 1931 DOA: 07/08/2017 PCP: Ernestina Penna, MD  Brief History:  84 year old male with a history of chronic atrial fibrillation, diabetes mellitus, hyperlipidemia, hypertension, stroke presenting with 1 week history of increasing lower extremity edema up to his thighs.  The patient also complained of orthopnea type symptoms.  He also complained of some scrotal edema.  Upon presentation, the patient was noted to have a BNP of 1752.  Chest x-ray showed interstitial edema with bilateral pleural effusions.  The patient was started on intravenous furosemide with good clinical effect.  Review of records showed AC was discontinued in 2015 by his PCP as he was unable to afford Eliquis and declined Coumadin at that time. Reports that most of his medications are managed by his daughter who is a Engineer, civil (consulting).   Assessment/Plan: Acute systolic CHF -continue IV lasix--increase to bid dosing -NEG 2.7 L -NEG 3 lbs -strict I/Os -remains clinically fluid overloaded -07/10/2017 echo EF 35-40%, diffuse HK, AK mid inferolateral, PASP 78, moderate TR/MR  Chronic atrial fibrillation -Rate control -CHADSVASc = 6 -Previously unable to afford NOAC and declined to be on Coumadin -continue ASA for now -pt to discuss with daughter regards to restart [facility]  Lower extremity edema -Venous duplex negative for DVT  Essential hypertension -Continue BiDil and lisinopril -holding atenolol due to bradycardia  Diabetes mellitus type 2 -CBGs controlled -Continue Lantus 10 units daily -Continue NovoLog sliding scale  Hyperlipidemia -Continue statin   Disposition Plan:   Home in 1-2 days  Family Communication:   No Family at bedside  Consultants:  cardiology  Code Status:  FULL  DVT Prophylaxis:  [place] Lovenox   Procedures: As Listed in Progress Note Above  Antibiotics: None    Subjective: Patient denies fevers, chills, headache, chest pain,  dyspnea, nausea, vomiting, diarrhea, abdominal pain, dysuria, hematuria, hematochezia, and melena.   Objective: Vitals:   07/09/17 2125 07/10/17 0603 07/10/17 1344 07/10/17 1500  BP: (!) 151/60 128/62 (!) 145/54 (!) 155/74  Pulse: (!) 47 61 (!) 56 (!) 48  Resp: 16 14 18 12   Temp: 98.2 F (36.8 C) 97.7 F (36.5 C) 97.8 F (36.6 C) (!) 97.5 F (36.4 C)  TempSrc: Oral Oral Oral Oral  SpO2: 94% 98% 98% 97%  Weight:  70.1 kg (154 lb 8.7 oz)    Height:        Intake/Output Summary (Last 24 hours) at 07/10/2017 1750 Last data filed at 07/10/2017 1700 Gross per 24 hour  Intake 960 ml  Output 1650 ml  Net -690 ml   Weight change: 4.328 kg (9 lb 8.7 oz) Exam:   General:  Pt is alert, follows commands appropriately, not in acute distress  HEENT: No icterus, No thrush, No neck mass, [place]/AT  Cardiovascular: RRR, S1/S2, no rubs, no gallops  Respiratory: CTA bilaterally, no wheezing, no crackles, no rhonchi  Abdomen: Soft/+BS, non tender, non distended, no guarding  Extremities: 1 + LE edema, No lymphangitis, No petechiae, No rashes, no synovitis   Data Reviewed: I have personally reviewed following labs and imaging studies Basic Metabolic Panel: Recent Labs  Lab 07/08/17 1440 07/09/17 0715 07/10/17 1008  NA 137 137 138  K 3.5 4.0 3.7  CL 100* 101 102  CO2 28 31 31   GLUCOSE 157* 117* 164*  BUN 16 19 20   CREATININE 1.14 1.02 0.95  CALCIUM 8.2* 7.8* 7.9*   Liver Function Tests: Recent Labs  Lab  07/08/17 1541 07/09/17 0715  AST 22 21  ALT 19 18  ALKPHOS 121 101  BILITOT 0.8 0.4  PROT 6.2* 5.7*  ALBUMIN 2.4* 2.2*   No results for input(s): LIPASE, AMYLASE in the last 168 hours. No results for input(s): AMMONIA in the last 168 hours. Coagulation Profile: No results for input(s): INR, PROTIME in the last 168 hours. CBC: Recent Labs  Lab 07/08/17 1440 07/09/17 0715  WBC 6.5 5.8  NEUTROABS 3.8  --   HGB 13.5 12.4*  HCT 41.2 37.8*  MCV 99.3 99.0  PLT 208  185   Cardiac Enzymes: Recent Labs  Lab 07/08/17 1440 07/08/17 1939 07/09/17 0120 07/09/17 0715  TROPONINI <0.03 <0.03 <0.03 <0.03   BNP: Invalid input(s): POCBNP CBG: Recent Labs  Lab 07/09/17 2125 07/10/17 0058 07/10/17 0741 07/10/17 1140 07/10/17 1643  GLUCAP 117* 185* 113* 187* 237*   HbA1C: No results for input(s): HGBA1C in the last 72 hours. Urine analysis:    Component Value Date/Time   COLORURINE YELLOW 07/09/2017 0720   APPEARANCEUR CLEAR 07/09/2017 0720   LABSPEC 1.017 07/09/2017 0720   PHURINE 5.0 07/09/2017 0720   GLUCOSEU NEGATIVE 07/09/2017 0720   HGBUR SMALL (A) 07/09/2017 0720   BILIRUBINUR NEGATIVE 07/09/2017 0720   BILIRUBINUR negative 04/13/2013 1030   KETONESUR NEGATIVE 07/09/2017 0720   PROTEINUR >300 (A) 07/09/2017 0720   UROBILINOGEN negative 04/13/2013 1030   UROBILINOGEN 0.2 08/12/2009 2205   NITRITE NEGATIVE 07/09/2017 0720   LEUKOCYTESUR NEGATIVE 07/09/2017 0720   Sepsis Labs: @LABRCNTIP (procalcitonin:4,lacticidven:4) )No results found for this or any previous visit (from the past 240 hour(s)).   Scheduled Meds: . allopurinol  100 mg Oral Daily  . aspirin  81 mg Oral Daily  . atorvastatin  20 mg Oral q1800  . cholecalciferol  2,000 Units Oral Daily  . enoxaparin (LOVENOX) injection  40 mg Subcutaneous Q24H  . feeding supplement (ENSURE ENLIVE)  237 mL Oral BID BM  . feeding supplement (PRO-STAT SUGAR FREE 64)  30 mL Oral BID  . furosemide  40 mg Intravenous BID  . insulin aspart  0-5 Units Subcutaneous QHS  . insulin aspart  0-9 Units Subcutaneous TID WC  . insulin glargine  10 Units Subcutaneous Q2200  . isosorbide-hydrALAZINE  1 tablet Oral BID  . lisinopril  40 mg Oral Daily  . omega-3 acid ethyl esters  1 g Oral Daily  . sodium chloride flush  3 mL Intravenous Q12H   Continuous Infusions: . sodium chloride      Procedures/Studies: Dg Chest 2 View  Result Date: 07/08/2017 CLINICAL DATA:  84 y/o  M; lower leg and  lower abdominal edema. EXAM: CHEST - 2 VIEW COMPARISON:  11/08/2016 chest radiograph. FINDINGS: Normal cardiac silhouette given projection and technique. Post median sternotomy and CABG. Calcific atherosclerosis of aorta. Moderate right and small left pleural effusions. Reticular opacities of the lungs probably representing interstitial edema. No acute osseous abnormality is evident. IMPRESSION: 1. Moderate right and small left pleural effusions. 2. Interstitial edema. 3. Aortic atherosclerosis. Electronically Signed   By: Mitzi HansenLance  Furusawa-Stratton M.D.   On: 07/08/2017 15:02   [place] Venous Img Lower Bilateral  Result Date: 07/08/2017 CLINICAL DATA:  Bilateral lower extremity edema right greater than EXAM: BILATERAL LOWER EXTREMITY VENOUS DOPPLER ULTRASOUND TECHNIQUE: Gray-scale sonography with graded compression, as well as color Doppler and duplex ultrasound were performed to evaluate the lower extremity deep venous systems from the level of the common femoral vein and including the common femoral, femoral, profunda femoral,  popliteal and calf veins including the posterior tibial, peroneal and gastrocnemius veins when visible. The superficial great saphenous vein was also interrogated. Spectral Doppler was utilized to evaluate flow at rest and with distal augmentation maneuvers in the common femoral, femoral and popliteal veins. COMPARISON:  None. FINDINGS: RIGHT LOWER EXTREMITY Common Femoral Vein: No evidence of thrombus. Normal compressibility, respiratory phasicity and response to augmentation. Saphenofemoral Junction: No evidence of thrombus. Normal compressibility and flow on color Doppler imaging. Profunda Femoral Vein: No evidence of thrombus. Normal compressibility and flow on color Doppler imaging. Femoral Vein: No evidence of thrombus. Normal compressibility, respiratory phasicity and response to augmentation. Popliteal Vein: No evidence of thrombus. Normal compressibility, respiratory phasicity and  response to augmentation. Calf Veins: No evidence of thrombus. Normal compressibility and flow on color Doppler imaging. Superficial Great Saphenous Vein: No evidence of thrombus. Normal compressibility. Venous Reflux:  None. Other Findings:  Subcutaneous edema noted. LEFT LOWER EXTREMITY Common Femoral Vein: No evidence of thrombus. Normal compressibility, respiratory phasicity and response to augmentation. Saphenofemoral Junction: No evidence of thrombus. Normal compressibility and flow on color Doppler imaging. Profunda Femoral Vein: No evidence of thrombus. Normal compressibility and flow on color Doppler imaging. Femoral Vein: No evidence of thrombus. Normal compressibility, respiratory phasicity and response to augmentation. Popliteal Vein: No evidence of thrombus. Normal compressibility, respiratory phasicity and response to augmentation. Calf Veins: No evidence of thrombus. Normal compressibility and flow on color Doppler imaging. Superficial Great Saphenous Vein: No evidence of thrombus. Normal compressibility. Venous Reflux:  None. Other Findings:  Subcutaneous edema evident. IMPRESSION: Negative for significant lower extremity DVT in either leg. Peripheral subcutaneous edema noted bilaterally. Electronically Signed   By: Judie Petit.  Shick M.D.   On: 07/08/2017 17:05    Catarina Hartshorn, DO  [facility] Pager [phone]  If 7PM-7AM, please contact night-coverage [email or web address] Password [email or web address] 07/10/2017, 5:50 PM   LOS: 2 days

## 2017-07-10 NOTE — Care Management Important Message (Signed)
Important Message  Patient Details  Name: Gilda CreaseHoward J Coddington MRN: [id number] Date of Birth: [birth year]   Medicare Important Message Given:  Yes    Renie OraHawkins, Keona Sheffler Smith 07/10/2017, 12:09 PM

## 2017-07-10 NOTE — Progress Notes (Signed)
*  PRELIMINARY RESULTS* Echocardiogram 2D Echocardiogram has been performed.  Stacey DrainWhite, Bellamy Rubey J 07/10/2017, 4:31 PM

## 2017-07-11 DIAGNOSIS — Z7189 Other specified counseling: Secondary | ICD-10-CM

## 2017-07-11 DIAGNOSIS — I5023 Acute on chronic systolic (congestive) heart failure: Secondary | ICD-10-CM

## 2017-07-11 LAB — GLUCOSE, CAPILLARY
GLUCOSE-CAPILLARY: 124 mg/dL — AB (ref 65–99)
GLUCOSE-CAPILLARY: 173 mg/dL — AB (ref 65–99)
Glucose-Capillary: 86 mg/dL (ref 65–99)
Glucose-Capillary: 176 mg/dL — ABNORMAL HIGH (ref 65–99)

## 2017-07-11 LAB — BASIC METABOLIC PANEL
ANION GAP: 8 (ref 5–15)
BUN: 26 mg/dL — AB (ref 6–20)
CHLORIDE: 99 mmol/L — AB (ref 101–111)
CO2: 31 mmol/L (ref 22–32)
Calcium: 8.4 mg/dL — ABNORMAL LOW (ref 8.9–10.3)
Creatinine, Ser: 1.01 mg/dL (ref 0.61–1.24)
GFR calc Af Amer: >60 mL/min (ref >60)
GFR calc non Af Amer: >60 mL/min (ref >60)
GLUCOSE: 141 mg/dL — AB (ref 65–99)
POTASSIUM: 3.8 mmol/L (ref 3.5–5.1)
Sodium: 138 mmol/L (ref 135–145)

## 2017-07-11 MED ORDER — APIXABAN 5 MG PO TABS
5.0000 mg | ORAL_TABLET | Freq: Two times a day (BID) | ORAL | Status: DC
  Administered 2017-07-11 – 2017-07-14 (×7): 5 mg via ORAL
  Filled 2017-07-11 (×7): qty 1

## 2017-07-11 NOTE — Progress Notes (Addendum)
Progress Note  Patient Name: Chad [name] Date of Encounter: 07/11/2017  Primary Cardiologist: Rollene RotundaJames Hochrein, MD   Subjective   Breathing significantly improved. Orthopnea has resolved. Still has significant lower extremity edema. Denies any chest pain or palpitations.   Inpatient Medications    Scheduled Meds: . allopurinol  100 mg Oral Daily  . aspirin  81 mg Oral Daily  . atorvastatin  20 mg Oral q1800  . cholecalciferol  2,000 Units Oral Daily  . enoxaparin (LOVENOX) injection  40 mg Subcutaneous Q24H  . feeding supplement (ENSURE ENLIVE)  237 mL Oral BID BM  . feeding supplement (PRO-STAT SUGAR FREE 64)  30 mL Oral BID  . furosemide  40 mg Intravenous BID  . insulin aspart  0-5 Units Subcutaneous QHS  . insulin aspart  0-9 Units Subcutaneous TID WC  . insulin glargine  10 Units Subcutaneous Q2200  . isosorbide-hydrALAZINE  1 tablet Oral BID  . lisinopril  40 mg Oral Daily  . omega-3 acid ethyl esters  1 g Oral Daily  . sodium chloride flush  3 mL Intravenous Q12H   Continuous Infusions: . sodium chloride     PRN Meds: sodium chloride, acetaminophen **OR** acetaminophen, diphenhydrAMINE, sodium chloride flush   Vital Signs    Vitals:   07/10/17 1344 07/10/17 1500 07/10/17 2136 07/11/17 0640  BP: (!) 145/54 (!) 155/74 126/62 128/64  Pulse: (!) 56 (!) 48 65 (!) 54  Resp: 18 12 15 18   Temp: 97.8 F (36.6 C) (!) 97.5 F (36.4 C) 98.6 F (37 C) 98.5 F (36.9 C)  TempSrc: Oral Oral Oral Oral  SpO2: 98% 97% 95% 95%  Weight:    149 lb 14.6 oz (68 kg)  Height:        Intake/Output Summary (Last 24 hours) at 07/11/2017 0817 Last data filed at 07/11/2017 0600 Gross per 24 hour  Intake 963 ml  Output 2350 ml  Net -1387 ml   Filed Weights   07/09/17 0519 07/10/17 0603 07/11/17 0640  Weight: 157 lb 13.6 oz (71.6 kg) 154 lb 8.7 oz (70.1 kg) 149 lb 14.6 oz (68 kg)    Telemetry    Atrial fibrillation, HR in 50's to 60's. Frequent PVC's with episodes of  bigeminy at times. - Personally Reviewed  ECG    No new tracings.   Physical Exam   General: Well developed, elderly Caucasian male appearing in no acute distress. Head: Normocephalic, atraumatic.  Neck: Supple without bruits, JVD not elevated. Lungs:  Resp regular and unlabored, CTA without wheezing or rales Heart: Irregularly irregular, S1, S2, no S3, S4, or murmur; no rub. Abdomen: Soft, non-tender, non-distended with normoactive bowel sounds. No hepatomegaly. No rebound/guarding. No obvious abdominal masses. Extremities: No clubbing or cyanosis, 2+ pitting edema along RLE, improved along LLE. Distal pedal pulses are 2+ bilaterally. Neuro: Alert and oriented X 3. Moves all extremities spontaneously. Psych: Normal affect.  Labs    Chemistry Recent Labs  Lab 07/08/17 1440 07/08/17 1541 07/09/17 0715 07/10/17 1008  NA 137  --  137 138  K 3.5  --  4.0 3.7  CL 100*  --  101 102  CO2 28  --  31 31  GLUCOSE 157*  --  117* 164*  BUN 16  --  19 20  CREATININE 1.14  --  1.02 0.95  CALCIUM 8.2*  --  7.8* 7.9*  PROT  --  6.2* 5.7*  --   ALBUMIN  --  2.4* 2.2*  --  AST  --  22 21  --   ALT  --  19 18  --   ALKPHOS  --  121 101  --   BILITOT  --  0.8 0.4  --   GFRNONAA 57*  --  >60 >60  GFRAA >60  --  >60 >60  ANIONGAP 9  --  5 5     Hematology Recent Labs  Lab 07/08/17 1440 07/09/17 0715  WBC 6.5 5.8  RBC 4.15* 3.82*  HGB 13.5 12.4*  HCT 41.2 37.8*  MCV 99.3 99.0  MCH 32.5 32.5  MCHC 32.8 32.8  RDW 16.0* 16.0*  PLT 208 185    Cardiac Enzymes Recent Labs  Lab 07/08/17 1440 07/08/17 1939 07/09/17 0120 07/09/17 0715  TROPONINI <0.03 <0.03 <0.03 <0.03   No results for input(s): TROPIPOC in the last 168 hours.   BNP Recent Labs  Lab 07/08/17 1440  BNP 1,752.0*     DDimer No results for input(s): DDIMER in the last 168 hours.   Radiology    No results found.  Cardiac Studies   Echocardiogram: 07/10/2017 Study Conclusions  - Left ventricle:  The cavity size was normal. Wall thickness was   increased in a pattern of mild LVH. Systolic function was   moderately reduced. The estimated ejection fraction was in the   range of 35% to 40%. Diffuse hypokinesis. The study was not   technically sufficient to allow evaluation of LV diastolic   dysfunction due to atrial fibrillation. - Regional wall motion abnormality: Akinesis of the basal   anteroseptal, mid inferior, and mid inferolateral myocardium. - Aortic valve: Moderately calcified annulus. Trileaflet; mildly   thickened leaflets. There was mild regurgitation. - Mitral valve: Mildly calcified annulus. Mildly calcified leaflets   . There was moderate regurgitation. - Left atrium: The atrium was severely dilated. - Right ventricle: Systolic function was reduced. - Atrial septum: No defect or patent foramen ovale was identified. - Tricuspid valve: There was moderate regurgitation. - Pulmonary arteries: Systolic pressure was severely increased. PA   peak pressure: 78 mm Hg (S). - Inferior vena cava: The vessel was dilated. The respirophasic   diameter changes were blunted (< 50%), consistent with elevated   central venous pressure. Estimated CVP 15 mmHg.  Patient Profile     84 y.o. male w/ PMH of CAD (s/p prior CABG in the 1980's), chronic atrial fibrillation (no longer on anticoagulation), HTN, HLD, IDDM, and prior CVA who presented to [facility] ED on 6/17 for worsening edema and orthopnea over the past few weeks. Cardiology consulted to assist with management of atrial fibrillation and CHF.  Assessment & Plan    1. Acute Systolic CHF Exacerbation - Presented with worsening lower extremity edema and orthopnea over the past week. BNP elevated to 1752 and initial CXR showed moderate right and small left pleural effusions with interstitial edema. Echocardiogram shows a reduced EF of 35-40%.  - He has been started on IV Lasix 40 and is overall -3.7 L since the time of admission and  weight has declined by over 7 pounds (156 --> 149 lbs). Orthopnea has resolved but still with significant edema which is more significant along his RLE (dopplers negative for DVT). Would continue with IV Lasix 40mg  BID today with possible switch back to PO diuretics tomorrow (will likely require PO Lasix 40mg  daily at the time of discharge). Will recheck a BMET as this has not been obtained. - in regards to his cardiomyopathy, I reviewed the echo  results with the patient. He is in agreement with medical management at this time as he has not experienced any recent anginal symptoms. Would continue ACE-I and BiDil. Possible switch to [facility] as an outpatient pending BP response as creatinine has remained stable.  No longer on BB therapy given baseline bradycardia. I attempted to call his daughter Efraim Kaufmann Bossman) at his request to review the report but there was no answer and a VM is not currently set-up.   2. Chronic Atrial Fibrillation - This dates back to at least 2015 by review of notes and prior EKG's. He had been bradycardiac on telemetry with heart rate in the 40's to 50's and PTA Atenolol was discontinued. HR has now improved into the mid-50's to 60's.   - This patients CHA2DS2-VASc Score and unadjusted Ischemic Stroke Rate (% per year) is equal to 11.2 % stroke rate/year from a score of 7 (HTN, DM, Vascular, Age (2), CVA (2)). Previously unable to afford NOAC and declined to be on Coumadin. Patient reports his daughter who is a nurse manages his medications and he wishes to review with her further before starting anticoagulation. Continue ASA.   3. CAD - s/p prior CABG in the 1980's with no recent cardiac evaluation since. He denies any recent episodes of chest discomfort and is active at baseline. Cyclic troponin values have been negative and his EKG shows no acute ischemic changes.  - echo this admission shows a reduced EF of 35-40% with diffuse HK and akinesis of the basal anteroseptal, mid  inferior, and mid inferolateral myocardium.  - Continue ASA and statin therapy. Beta-blocker has been discontinued due to baseline bradycardia.  4. HTN - BP has been stable at 126/54 - 155/74 within the past 24 hours.  - continue Lisinopril 40mg  daily and BiDil 20-37.5mg  BID. Atenolol discontinued secondary to bradycardia.   5. HLD - followed by PCP. FLP in 09/2016 showed total cholesterol of 137, HDL 48, and LDL 78. Remains on Atorvastatin 20mg  daily.   6. IDDM - Hgb A1c at 7.8 in 09/2016. Further management per admitting team.    For questions or updates, please contact [facility] Please consult [email or web address] for contact info under Cardiology/STEMI.   Signed, Ellsworth Lennox , PA-C 8:17 AM 07/11/2017 Pager: [phone]  The patient was seen and examined, and I agree with the history, physical exam, assessment and plan as documented above, with modifications as noted below.  The patient is feeling better today with improved breathing and less lower extremity edema.  His daughter, Efraim Kaufmann, who had been a Multimedia programmer several years ago is present in the room.  I spoke to them about the echocardiogram results with reduced left ventricular systolic function, EF 35 to 40%.  Full echocardiogram is reviewed above.  We spoke about the need for systemic anticoagulation for thromboembolic risk reduction in both she and the patient are in agreement.  We will start apixaban 5 mg twice daily.  I also informed the patient and his daughter that I will likely transition IV Lasix to 40 mg daily of oral Lasix on 6/21 and they are in agreement with this as well.  He has put out nearly 2 L in the last 24+ hours.  BUN up to 26 today from 20 yesterday.  Creatinine is stable at 1.01.  Currently on BiDil and lisinopril.  I would consider switching lisinopril to [facility] in the outpatient setting.  He remains bradycardic and beta-blocker has been discontinued.  Blood pressure is normal  today.  Prentice Docker, MD, [facility]  07/11/2017 11:32 AM

## 2017-07-11 NOTE — Care Management (Signed)
Benefits check completed for Eliquis:       Co-pay amount for Eliquis is $74.00 60 for 30 day supply.  PA required @ [phone]  Pharmacy : [facility]     Pt agreeable to paying co-pay. CM provided 30-day free card.

## 2017-07-11 NOTE — Progress Notes (Signed)
PROGRESS NOTE  Arpan Eskelson Csaszar ZOX:[id number] DOB: 1931/12/08 DOA: 07/08/2017 PCP: Ernestina Penna, MD  Brief History:  84 year old male with a history of chronic atrial fibrillation, diabetes mellitus, hyperlipidemia, hypertension, stroke presenting with 1 week history of increasing lower extremity edema up to his thighs.  The patient also complained of orthopnea type symptoms.  He also complained of some scrotal edema.  Upon presentation, the patient was noted to have a BNP of 1752.  Chest x-ray showed interstitial edema with bilateral pleural effusions.  The patient was started on intravenous furosemide with good clinical effect.  Review of records showed AC was discontinued in 2015by his PCPas he was unable to afford Eliquis and declined Coumadin at that time. Reports that most of his medications are managed by his daughter who is a Engineer, civil (consulting).   Assessment/Plan: Acute systolic CHF -continue IV lasix--increase to bid dosing -NEG 3.7 L -NEG 8 lbs -strict I/Os -remains clinically fluid overloaded -07/10/2017 echo EF 35-40%, diffuse HK, AK mid inferolateral, PASP 78, moderate TR/MR  Chronic atrial fibrillation -Rate control -CHADSVASc = 6 -apixaban started  Lower extremity edema -Venous duplex negative for DVT  Essential hypertension -Continue BiDil and lisinopril -holding atenolol due to bradycardia  Diabetes mellitus type 2 -CBGs controlled -Continue Lantus 10 units daily -Continue NovoLog sliding scale  Hyperlipidemia -Continue statin   Disposition Plan:   Home 6/22 if stable Family Communication:   No Family at bedside  Consultants:  cardiology  Code Status:  FULL  DVT Prophylaxis:  [place] Lovenox   Procedures: As Listed in Progress Note Above  Antibiotics: None       Subjective: Patient denies fevers, chills, headache, chest pain, dyspnea, nausea, vomiting, diarrhea, abdominal pain, dysuria, hematuria, hematochezia, and  melena.   Objective: Vitals:   07/10/17 1344 07/10/17 1500 07/10/17 2136 07/11/17 0640  BP: (!) 145/54 (!) 155/74 126/62 128/64  Pulse: (!) 56 (!) 48 65 (!) 54  Resp: 18 12 15 18   Temp: 97.8 F (36.6 C) (!) 97.5 F (36.4 C) 98.6 F (37 C) 98.5 F (36.9 C)  TempSrc: Oral Oral Oral Oral  SpO2: 98% 97% 95% 95%  Weight:    68 kg (149 lb 14.6 oz)  Height:        Intake/Output Summary (Last 24 hours) at 07/11/2017 1243 Last data filed at 07/11/2017 1209 Gross per 24 hour  Intake 723 ml  Output 2400 ml  Net -1677 ml   Weight change: -2.1 kg (-4 lb 10.1 oz) Exam:   General:  Pt is alert, follows commands appropriately, not in acute distress  HEENT: No icterus, No thrush, No neck mass, [place]/AT  Cardiovascular: IRRR, S1/S2, no rubs, no gallops  Respiratory: CTA bilaterally, no wheezing, no crackles, no rhonchi  Abdomen: Soft/+BS, non tender, non distended, no guarding  Extremities: 1 +LE edema, No lymphangitis, No petechiae, No rashes, no synovitis   Data Reviewed: I have personally reviewed following labs and imaging studies Basic Metabolic Panel: Recent Labs  Lab 07/08/17 1440 07/09/17 0715 07/10/17 1008 07/11/17 0931  NA 137 137 138 138  K 3.5 4.0 3.7 3.8  CL 100* 101 102 99*  CO2 28 31 31 31   GLUCOSE 157* 117* 164* 141*  BUN 16 19 20  26*  CREATININE 1.14 1.02 0.95 1.01  CALCIUM 8.2* 7.8* 7.9* 8.4*   Liver Function Tests: Recent Labs  Lab 07/08/17 1541 07/09/17 0715  AST 22 21  ALT 19 18  ALKPHOS  121 101  BILITOT 0.8 0.4  PROT 6.2* 5.7*  ALBUMIN 2.4* 2.2*   No results for input(s): LIPASE, AMYLASE in the last 168 hours. No results for input(s): AMMONIA in the last 168 hours. Coagulation Profile: No results for input(s): INR, PROTIME in the last 168 hours. CBC: Recent Labs  Lab 07/08/17 1440 07/09/17 0715  WBC 6.5 5.8  NEUTROABS 3.8  --   HGB 13.5 12.4*  HCT 41.2 37.8*  MCV 99.3 99.0  PLT 208 185   Cardiac Enzymes: Recent Labs  Lab  07/08/17 1440 07/08/17 1939 07/09/17 0120 07/09/17 0715  TROPONINI <0.03 <0.03 <0.03 <0.03   BNP: Invalid input(s): POCBNP CBG: Recent Labs  Lab 07/10/17 1140 07/10/17 1643 07/10/17 2135 07/11/17 0804 07/11/17 1145  GLUCAP 187* 237* 159* 86 176*   HbA1C: No results for input(s): HGBA1C in the last 72 hours. Urine analysis:    Component Value Date/Time   COLORURINE YELLOW 07/09/2017 0720   APPEARANCEUR CLEAR 07/09/2017 0720   LABSPEC 1.017 07/09/2017 0720   PHURINE 5.0 07/09/2017 0720   GLUCOSEU NEGATIVE 07/09/2017 0720   HGBUR SMALL (A) 07/09/2017 0720   BILIRUBINUR NEGATIVE 07/09/2017 0720   BILIRUBINUR negative 04/13/2013 1030   KETONESUR NEGATIVE 07/09/2017 0720   PROTEINUR >300 (A) 07/09/2017 0720   UROBILINOGEN negative 04/13/2013 1030   UROBILINOGEN 0.2 08/12/2009 2205   NITRITE NEGATIVE 07/09/2017 0720   LEUKOCYTESUR NEGATIVE 07/09/2017 0720   Sepsis Labs: @LABRCNTIP (procalcitonin:4,lacticidven:4) )No results found for this or any previous visit (from the past 240 hour(s)).   Scheduled Meds: . allopurinol  100 mg Oral Daily  . apixaban  5 mg Oral BID  . atorvastatin  20 mg Oral q1800  . cholecalciferol  2,000 Units Oral Daily  . feeding supplement (ENSURE ENLIVE)  237 mL Oral BID BM  . feeding supplement (PRO-STAT SUGAR FREE 64)  30 mL Oral BID  . furosemide  40 mg Intravenous BID  . insulin aspart  0-5 Units Subcutaneous QHS  . insulin aspart  0-9 Units Subcutaneous TID WC  . insulin glargine  10 Units Subcutaneous Q2200  . isosorbide-hydrALAZINE  1 tablet Oral BID  . lisinopril  40 mg Oral Daily  . omega-3 acid ethyl esters  1 g Oral Daily  . sodium chloride flush  3 mL Intravenous Q12H   Continuous Infusions: . sodium chloride      Procedures/Studies: Dg Chest 2 View  Result Date: 07/08/2017 CLINICAL DATA:  84 y/o  M; lower leg and lower abdominal edema. EXAM: CHEST - 2 VIEW COMPARISON:  11/08/2016 chest radiograph. FINDINGS: Normal cardiac  silhouette given projection and technique. Post median sternotomy and CABG. Calcific atherosclerosis of aorta. Moderate right and small left pleural effusions. Reticular opacities of the lungs probably representing interstitial edema. No acute osseous abnormality is evident. IMPRESSION: 1. Moderate right and small left pleural effusions. 2. Interstitial edema. 3. Aortic atherosclerosis. Electronically Signed   By: Mitzi HansenLance  Furusawa-Stratton M.D.   On: 07/08/2017 15:02   [place] Venous Img Lower Bilateral  Result Date: 07/08/2017 CLINICAL DATA:  Bilateral lower extremity edema right greater than EXAM: BILATERAL LOWER EXTREMITY VENOUS DOPPLER ULTRASOUND TECHNIQUE: Gray-scale sonography with graded compression, as well as color Doppler and duplex ultrasound were performed to evaluate the lower extremity deep venous systems from the level of the common femoral vein and including the common femoral, femoral, profunda femoral, popliteal and calf veins including the posterior tibial, peroneal and gastrocnemius veins when visible. The superficial great saphenous vein was also interrogated. Spectral Doppler  was utilized to evaluate flow at rest and with distal augmentation maneuvers in the common femoral, femoral and popliteal veins. COMPARISON:  None. FINDINGS: RIGHT LOWER EXTREMITY Common Femoral Vein: No evidence of thrombus. Normal compressibility, respiratory phasicity and response to augmentation. Saphenofemoral Junction: No evidence of thrombus. Normal compressibility and flow on color Doppler imaging. Profunda Femoral Vein: No evidence of thrombus. Normal compressibility and flow on color Doppler imaging. Femoral Vein: No evidence of thrombus. Normal compressibility, respiratory phasicity and response to augmentation. Popliteal Vein: No evidence of thrombus. Normal compressibility, respiratory phasicity and response to augmentation. Calf Veins: No evidence of thrombus. Normal compressibility and flow on color Doppler  imaging. Superficial Great Saphenous Vein: No evidence of thrombus. Normal compressibility. Venous Reflux:  None. Other Findings:  Subcutaneous edema noted. LEFT LOWER EXTREMITY Common Femoral Vein: No evidence of thrombus. Normal compressibility, respiratory phasicity and response to augmentation. Saphenofemoral Junction: No evidence of thrombus. Normal compressibility and flow on color Doppler imaging. Profunda Femoral Vein: No evidence of thrombus. Normal compressibility and flow on color Doppler imaging. Femoral Vein: No evidence of thrombus. Normal compressibility, respiratory phasicity and response to augmentation. Popliteal Vein: No evidence of thrombus. Normal compressibility, respiratory phasicity and response to augmentation. Calf Veins: No evidence of thrombus. Normal compressibility and flow on color Doppler imaging. Superficial Great Saphenous Vein: No evidence of thrombus. Normal compressibility. Venous Reflux:  None. Other Findings:  Subcutaneous edema evident. IMPRESSION: Negative for significant lower extremity DVT in either leg. Peripheral subcutaneous edema noted bilaterally. Electronically Signed   By: Judie Petit.  Shick M.D.   On: 07/08/2017 17:05    Catarina Hartshorn, DO  [facility] Pager [phone]  If 7PM-7AM, please contact night-coverage [email or web address] Password [email or web address] 07/11/2017, 12:43 PM   LOS: 3 days

## 2017-07-12 DIAGNOSIS — I5021 Acute systolic (congestive) heart failure: Secondary | ICD-10-CM

## 2017-07-12 LAB — BASIC METABOLIC PANEL
ANION GAP: 6 (ref 5–15)
BUN: 27 mg/dL — AB (ref 6–20)
CO2: 31 mmol/L (ref 22–32)
CREATININE: 1.03 mg/dL (ref 0.61–1.24)
Calcium: 8 mg/dL — ABNORMAL LOW (ref 8.9–10.3)
Chloride: 98 mmol/L — ABNORMAL LOW (ref 101–111)
GFR calc Af Amer: >60 mL/min (ref >60)
GFR calc non Af Amer: >60 mL/min (ref >60)
Glucose, Bld: 115 mg/dL — ABNORMAL HIGH (ref 65–99)
Potassium: 4.2 mmol/L (ref 3.5–5.1)
Sodium: 135 mmol/L (ref 135–145)

## 2017-07-12 LAB — URINALYSIS, COMPLETE (UACMP) WITH MICROSCOPIC
Bilirubin Urine: NEGATIVE
GLUCOSE, UA: NEGATIVE mg/dL
Hgb urine dipstick: NEGATIVE
KETONES UR: NEGATIVE mg/dL
LEUKOCYTES UA: NEGATIVE
Nitrite: NEGATIVE
PH: 8 (ref 5.0–8.0)
Protein, ur: 30 mg/dL — AB
SPECIFIC GRAVITY, URINE: 1.006 (ref 1.005–1.030)

## 2017-07-12 LAB — GLUCOSE, CAPILLARY
Glucose-Capillary: 107 mg/dL — ABNORMAL HIGH (ref 65–99)
Glucose-Capillary: 182 mg/dL — ABNORMAL HIGH (ref 65–99)
Glucose-Capillary: 143 mg/dL — ABNORMAL HIGH (ref 65–99)
Glucose-Capillary: 213 mg/dL — ABNORMAL HIGH (ref 65–99)

## 2017-07-12 NOTE — Plan of Care (Signed)
progressing 

## 2017-07-12 NOTE — Progress Notes (Addendum)
Progress Note  Patient Name: Chad [name] J Wissink Date of Encounter: 07/12/2017  Primary Cardiologist: Rollene RotundaJames Hochrein, MD   Subjective   Breathing significantly improved. Feels his breathing is at baseline but has not walked.   Inpatient Medications    Scheduled Meds: . allopurinol  100 mg Oral Daily  . apixaban  5 mg Oral BID  . atorvastatin  20 mg Oral q1800  . cholecalciferol  2,000 Units Oral Daily  . feeding supplement (ENSURE ENLIVE)  237 mL Oral BID BM  . feeding supplement (PRO-STAT SUGAR FREE 64)  30 mL Oral BID  . furosemide  40 mg Intravenous BID  . insulin aspart  0-5 Units Subcutaneous QHS  . insulin aspart  0-9 Units Subcutaneous TID WC  . insulin glargine  10 Units Subcutaneous Q2200  . isosorbide-hydrALAZINE  1 tablet Oral BID  . lisinopril  40 mg Oral Daily  . omega-3 acid ethyl esters  1 g Oral Daily  . sodium chloride flush  3 mL Intravenous Q12H   Continuous Infusions: . sodium chloride     PRN Meds: sodium chloride, acetaminophen **OR** acetaminophen, diphenhydrAMINE, sodium chloride flush   Vital Signs    Vitals:   07/11/17 0640 07/11/17 1545 07/11/17 2121 07/12/17 0653  BP: 128/64 (!) 148/57 136/66 (!) 138/59  Pulse: (!) 54 (!) 52 71 (!) 137  Resp: 18 16 20 18   Temp: 98.5 F (36.9 C) 98.4 F (36.9 C) 97.6 F (36.4 C) (!) 97.5 F (36.4 C)  TempSrc: Oral Oral Oral Oral  SpO2: 95% 98% 97% 96%  Weight: 149 lb 14.6 oz (68 kg)     Height:        Intake/Output Summary (Last 24 hours) at 07/12/2017 0909 Last data filed at 07/12/2017 0500 Gross per 24 hour  Intake 720 ml  Output 1675 ml  Net -955 ml   Filed Weights   07/09/17 0519 07/10/17 0603 07/11/17 0640  Weight: 157 lb 13.6 oz (71.6 kg) 154 lb 8.7 oz (70.1 kg) 149 lb 14.6 oz (68 kg)    Telemetry    Atrial fibrillation, HR in 30's at times. PVCs and short runs NSVT - Personally Reviewed  ECG    No new tracings.   Physical Exam   General: Well developed, elderly Caucasian male  appearing in no acute distress. Head: Normocephalic, atraumatic.  Neck: Supple without bruits, JVD 9 CM. Lungs:  Resp regular and unlabored, decreased BS bases R>L, without wheezing or rales Heart: Irregularly irregular, S1, S2, no S3, S4, or murmur; no rub. Abdomen: Soft, non-tender, non-distended with normoactive bowel sounds. No hepatomegaly. No rebound/guarding. No obvious abdominal masses. Extremities: No clubbing or cyanosis, 1+ R, trace L edema, Distal pedal pulses are 2+ bilaterally. Neuro: Alert and oriented X 3. Moves all extremities spontaneously. Psych: Normal affect.  Labs    Chemistry Recent Labs  Lab 07/08/17 1541 07/09/17 0715 07/10/17 1008 07/11/17 0931 07/12/17 0524  NA  --  137 138 138 135  K  --  4.0 3.7 3.8 4.2  CL  --  101 102 99* 98*  CO2  --  31 31 31 31   GLUCOSE  --  117* 164* 141* 115*  BUN  --  19 20 26* 27*  CREATININE  --  1.02 0.95 1.01 1.03  CALCIUM  --  7.8* 7.9* 8.4* 8.0*  PROT 6.2* 5.7*  --   --   --   ALBUMIN 2.4* 2.2*  --   --   --  AST 22 21  --   --   --   ALT 19 18  --   --   --   ALKPHOS 121 101  --   --   --   BILITOT 0.8 0.4  --   --   --   GFRNONAA  --  >60 >60 >60 >60  GFRAA  --  >60 >60 >60 >60  ANIONGAP  --  5 5 8 6      Hematology Recent Labs  Lab 07/08/17 1440 07/09/17 0715  WBC 6.5 5.8  RBC 4.15* 3.82*  HGB 13.5 12.4*  HCT 41.2 37.8*  MCV 99.3 99.0  MCH 32.5 32.5  MCHC 32.8 32.8  RDW 16.0* 16.0*  PLT 208 185    Cardiac Enzymes Recent Labs  Lab 07/08/17 1440 07/08/17 1939 07/09/17 0120 07/09/17 0715  TROPONINI <0.03 <0.03 <0.03 <0.03   No results for input(s): TROPIPOC in the last 168 hours.   BNP Recent Labs  Lab 07/08/17 1440  BNP 1,752.0*     DDimer No results for input(s): DDIMER in the last 168 hours.   Radiology    Dg Chest 2 View  Result Date: 07/08/2017 CLINICAL DATA:  84 y/o  M; lower leg and lower abdominal edema. EXAM: CHEST - 2 VIEW COMPARISON:  11/08/2016 chest radiograph.  FINDINGS: Normal cardiac silhouette given projection and technique. Post median sternotomy and CABG. Calcific atherosclerosis of aorta. Moderate right and small left pleural effusions. Reticular opacities of the lungs probably representing interstitial edema. No acute osseous abnormality is evident. IMPRESSION: 1. Moderate right and small left pleural effusions. 2. Interstitial edema. 3. Aortic atherosclerosis. Electronically Signed   By: Mitzi Hansen M.D.   On: 07/08/2017 15:02   US Venous Img Lower Bilateral  Result Date: 07/08/2017 CLINICAL DATA:  Bilateral lower extremity edema right greater than EXAM: BILATERAL LOWER EXTREMITY VENOUS DOPPLER ULTRASOUND TECHNIQUE: Gray-scale sonography with graded compression, as well as color Doppler and duplex ultrasound were performed to evaluate the lower extremity deep venous systems from the level of the common femoral vein and including the common femoral, femoral, profunda femoral, popliteal and calf veins including the posterior tibial, peroneal and gastrocnemius veins when visible. The superficial great saphenous vein was also interrogated. Spectral Doppler was utilized to evaluate flow at rest and with distal augmentation maneuvers in the common femoral, femoral and popliteal veins. COMPARISON:  None. FINDINGS: RIGHT LOWER EXTREMITY Common Femoral Vein: No evidence of thrombus. Normal compressibility, respiratory phasicity and response to augmentation. Saphenofemoral Junction: No evidence of thrombus. Normal compressibility and flow on color Doppler imaging. Profunda Femoral Vein: No evidence of thrombus. Normal compressibility and flow on color Doppler imaging. Femoral Vein: No evidence of thrombus. Normal compressibility, respiratory phasicity and response to augmentation. Popliteal Vein: No evidence of thrombus. Normal compressibility, respiratory phasicity and response to augmentation. Calf Veins: No evidence of thrombus. Normal compressibility and  flow on color Doppler imaging. Superficial Great Saphenous Vein: No evidence of thrombus. Normal compressibility. Venous Reflux:  None. Other Findings:  Subcutaneous edema noted. LEFT LOWER EXTREMITY Common Femoral Vein: No evidence of thrombus. Normal compressibility, respiratory phasicity and response to augmentation. Saphenofemoral Junction: No evidence of thrombus. Normal compressibility and flow on color Doppler imaging. Profunda Femoral Vein: No evidence of thrombus. Normal compressibility and flow on color Doppler imaging. Femoral Vein: No evidence of thrombus. Normal compressibility, respiratory phasicity and response to augmentation. Popliteal Vein: No evidence of thrombus. Normal compressibility, respiratory phasicity and response to augmentation. Calf Veins: No evidence  of thrombus. Normal compressibility and flow on color Doppler imaging. Superficial Great Saphenous Vein: No evidence of thrombus. Normal compressibility. Venous Reflux:  None. Other Findings:  Subcutaneous edema evident. IMPRESSION: Negative for significant lower extremity DVT in either leg. Peripheral subcutaneous edema noted bilaterally. Electronically Signed   By: Judie Petit.  Shick M.D.   On: 07/08/2017 17:05     Cardiac Studies   Echocardiogram: 07/10/2017 Study Conclusions  - Left ventricle: The cavity size was normal. Wall thickness was   increased in a pattern of mild LVH. Systolic function was   moderately reduced. The estimated ejection fraction was in the   range of 35% to 40%. Diffuse hypokinesis. The study was not   technically sufficient to allow evaluation of LV diastolic   dysfunction due to atrial fibrillation. - Regional wall motion abnormality: Akinesis of the basal   anteroseptal, mid inferior, and mid inferolateral myocardium. - Aortic valve: Moderately calcified annulus. Trileaflet; mildly   thickened leaflets. There was mild regurgitation. - Mitral valve: Mildly calcified annulus. Mildly calcified  leaflets   . There was moderate regurgitation. - Left atrium: The atrium was severely dilated. - Right ventricle: Systolic function was reduced. - Atrial septum: No defect or patent foramen ovale was identified. - Tricuspid valve: There was moderate regurgitation. - Pulmonary arteries: Systolic pressure was severely increased. PA   peak pressure: 78 mm Hg (S). - Inferior vena cava: The vessel was dilated. The respirophasic   diameter changes were blunted (< 50%), consistent with elevated   central venous pressure. Estimated CVP 15 mmHg.  Patient Profile     84 y.o. male w/ PMH of CAD (s/p prior CABG in the 1980's), chronic atrial fibrillation (no longer on anticoagulation), HTN, HLD, IDDM, and prior CVA who presented to [facility] ED on 6/17 for worsening edema and orthopnea over the past few weeks. Cardiology consulted to assist with management of atrial fibrillation and CHF.  Assessment & Plan    1. Acute Systolic CHF Exacerbation - Presented with worsening lower extremity edema and orthopnea over the past week. BNP elevated to 1752 and initial CXR showed moderate right and small left pleural effusions with interstitial edema. - Echocardiogram shows a reduced EF of 35-40%.  - He has been started on IV Lasix 40 and is overall -4.7 L since the time of admission and weight has declined by over 7 pounds (156 --> 149 lbs 06/20, wt pending today).  - Orthopnea has resolved - has R>L edema (dopplers negative for DVT). - continue with IV Lasix 40mg  BID today with possible switch back to PO diuretics tomorrow (will likely require PO Lasix 40mg  daily at the time of discharge).  - check a BMET qd - in regards to his cardiomyopathy, I reviewed the echo results with the patient. He is in agreement with medical management at this time as he has not experienced any recent anginal symptoms. Would continue ACE-I and BiDil. - Possible switch to [facility] as an outpatient pending BP response as creatinine  has remained stable.  - No longer on BB therapy given baseline bradycardia.  2. Chronic Atrial Fibrillation - This dates back to at least 2015 by review of notes and prior EKG's.  - Atenolol was discontinued.  - HR has now generally improved but still drops into the 30s at times - no discernable sx from this   - This patients CHA2DS2-VASc Score and unadjusted Ischemic Stroke Rate (% per year) is equal to 11.2 % stroke rate/year from a score of  7 (HTN, DM, Vascular, Age (2), CVA (2)). - Previously unable to afford NOAC and declined to be on Coumadin. Patient reports his daughter who is a nurse manages his medications and he wishes to review with her further before starting anticoagulation. Continue ASA.  - also w/ frequent PVCs and NSVT bursts - ck Mg, Keep K+ >4  3. CAD - s/p prior CABG in the 1980's with no recent cardiac evaluation since. He denies any recent episodes of chest discomfort and is active at baseline. Cyclic troponin values have been negative and his EKG shows no acute ischemic changes.  - echo this admission shows a reduced EF of 35-40% with diffuse HK and akinesis of the basal anteroseptal, mid inferior, and mid inferolateral myocardium.  - Continue ASA and statin therapy. Beta-blocker has been discontinued due to baseline bradycardia.  4. HTN - BP has been stable at 120s - 140s within the past 24 hours.  - continue Lisinopril 40mg  daily and BiDil 20-37.5mg  BID. Atenolol discontinued secondary to bradycardia.   5. HLD - followed by PCP. FLP in 09/2016 showed total cholesterol of 137, HDL 48, and LDL 78. Remains on Atorvastatin 20mg  daily.   6. IDDM - Hgb A1c at 7.8 in 09/2016.  - Per IM    For questions or updates, please contact [facility] Please consult [email or web address] for contact info under Cardiology/STEMI.   Signed, Theodore Demark, PA-C 07/12/2017 9:10 AM Beeper [phone]   The patient was seen and examined, and I agree with the history, physical  exam, assessment and plan as documented above, with modifications as noted below.  He is improving with respect to shortness of breath and bilateral leg edema.  He has put out roughly 1.4 L in the last 24+ hours. He complains of diminished appetite.  Renal function remained stable (BUN 27 today, 26 yesterday).  I spoke to his daughter, Efraim Kaufmann, yesterday who had been a Multimedia programmer several years ago.  LVEF is reduced, 35 to 40%.  I initiated apixaban 5 mg twice daily yesterday for systemic anticoagulation.  For this reason, he is no longer on aspirin.  He is received IV Lasix 40 mg this morning and as he still has some lower extremity edema and some faint bibasilar Rales, I would continue this today and switch to Lasix 40 mg daily on 07/13/2017.  He remains on BiDil and lisinopril.  I would consider switching lisinopril to [facility] in the outpatient setting.  He remains bradycardic and beta-blocker has been discontinued.  Blood pressure remained stable.  Hopefully, he will be able to be discharged within the next 24 hours.   Prentice Docker, MD, [facility]  07/12/2017 12:33 PM      Prentice Docker, MD, [facility]  07/12/2017 9:09 AM

## 2017-07-12 NOTE — Progress Notes (Signed)
PROGRESS NOTE  Chad [name] ZOX:[id number] DOB: December 11, 1931 DOA: 07/08/2017 PCP: Ernestina Penna, MD  Brief History: 84 year old male with a history of chronic atrial fibrillation, diabetes mellitus, hyperlipidemia, hypertension, stroke presenting with 1 week history of increasing lower extremity edema up to his thighs. The patient also complained of orthopnea type symptoms. He also complained of some scrotal edema. Upon presentation, the patient was noted to have a BNP of 1752. Chest x-ray showed interstitial edema with bilateral pleural effusions. The patient was started on intravenous furosemide with good clinical effect. Review of recordsshowed ACwas discontinued in 2015by his PCPas he was unable to afford Eliquis and declined Coumadin at that time. Reports that most of his medications are managed by his daughter who is a Engineer, civil (consulting).   Assessment/Plan: Acute systolic CHF -continue IV lasix--increase to bid dosing -NEG 4.7 L -NEG 8 lbs -strict I/Os -remains clinically fluid overloaded -07/10/2017 echo EF 35-40%, diffuse HK, AK mid inferolateral, PASP 78, moderate TR/MR  Chronic atrial fibrillation -Rate control -CHADSVASc = 6 -apixaban started  Lower extremity edema -Venous duplex negative for DVT  Essential hypertension -Continue BiDil and lisinopril -holding atenolol due to bradycardia  Diabetes mellitus type 2 -CBGs controlled -Continue Lantus 10 units daily -Continue NovoLog sliding scale  Hyperlipidemia -Continue statin  CAD -s/p prior CABGin the 1980'swith no recent cardiac evaluation since. -no chest pain -Troponins neg x 4 -EKG no ischemic changes  Suprapubic pain -UA and urine culture -CT abd/pelvis if pain worsens    Disposition Plan: Home 6/22 or 6/23 if stable Family Communication:brother and nephew updated at bedside 6/21--Total time spent 35 minutes.  Greater than 50% spent face to face counseling and coordinating  care.   Consultants:cardiology  Code Status: FULL  DVT Prophylaxis: [place] Lovenox   Procedures: As Listed in Progress Note Above  Antibiotics: None     Subjective: Pt c/o suprapubic pain with urination--burning sensation.  Started this afternoon.  Moderate intensity.  Otherwise Patient denies fevers, chills, headache, chest pain, dyspnea, nausea, vomiting, diarrhea,  hematuria, hematochezia, and melena.   Objective: Vitals:   07/11/17 1545 07/11/17 2121 07/12/17 0653 07/12/17 1351  BP: (!) 148/57 136/66 (!) 138/59 (!) 113/41  Pulse: (!) 52 71 (!) 137 (!) 59  Resp: 16 20 18 19   Temp: 98.4 F (36.9 C) 97.6 F (36.4 C) (!) 97.5 F (36.4 C) 97.6 F (36.4 C)  TempSrc: Oral Oral Oral Oral  SpO2: 98% 97% 96% 99%  Weight:      Height:        Intake/Output Summary (Last 24 hours) at 07/12/2017 1602 Last data filed at 07/12/2017 1300 Gross per 24 hour  Intake 840 ml  Output 2025 ml  Net -1185 ml   Weight change:  Exam:   General:  Pt is alert, follows commands appropriately, not in acute distress  HEENT: No icterus, No thrush, No neck mass, [place]/AT  Cardiovascular: RRR, S1/S2, no rubs, no gallops  Respiratory: fine bibasilar crackles, no wheeze  Abdomen: Soft/+BS, non tender, non distended, no guarding  Extremities: 1 + LE edema, No lymphangitis, No petechiae, No rashes, no synovitis   Data Reviewed: I have personally reviewed following labs and imaging studies Basic Metabolic Panel: Recent Labs  Lab 07/08/17 1440 07/09/17 0715 07/10/17 1008 07/11/17 0931 07/12/17 0524  NA 137 137 138 138 135  K 3.5 4.0 3.7 3.8 4.2  CL 100* 101 102 99* 98*  CO2 28 31 31 31  31  GLUCOSE 157* 117* 164* 141* 115*  BUN 16 19 20  26* 27*  CREATININE 1.14 1.02 0.95 1.01 1.03  CALCIUM 8.2* 7.8* 7.9* 8.4* 8.0*   Liver Function Tests: Recent Labs  Lab 07/08/17 1541 07/09/17 0715  AST 22 21  ALT 19 18  ALKPHOS 121 101  BILITOT 0.8 0.4  PROT 6.2* 5.7*  ALBUMIN  2.4* 2.2*   No results for input(s): LIPASE, AMYLASE in the last 168 hours. No results for input(s): AMMONIA in the last 168 hours. Coagulation Profile: No results for input(s): INR, PROTIME in the last 168 hours. CBC: Recent Labs  Lab 07/08/17 1440 07/09/17 0715  WBC 6.5 5.8  NEUTROABS 3.8  --   HGB 13.5 12.4*  HCT 41.2 37.8*  MCV 99.3 99.0  PLT 208 185   Cardiac Enzymes: Recent Labs  Lab 07/08/17 1440 07/08/17 1939 07/09/17 0120 07/09/17 0715  TROPONINI <0.03 <0.03 <0.03 <0.03   BNP: Invalid input(s): POCBNP CBG: Recent Labs  Lab 07/11/17 1145 07/11/17 1605 07/11/17 2118 07/12/17 0821 07/12/17 1145  GLUCAP 176* 124* 173* 107* 182*   HbA1C: No results for input(s): HGBA1C in the last 72 hours. Urine analysis:    Component Value Date/Time   COLORURINE YELLOW 07/09/2017 0720   APPEARANCEUR CLEAR 07/09/2017 0720   LABSPEC 1.017 07/09/2017 0720   PHURINE 5.0 07/09/2017 0720   GLUCOSEU NEGATIVE 07/09/2017 0720   HGBUR SMALL (A) 07/09/2017 0720   BILIRUBINUR NEGATIVE 07/09/2017 0720   BILIRUBINUR negative 04/13/2013 1030   KETONESUR NEGATIVE 07/09/2017 0720   PROTEINUR >300 (A) 07/09/2017 0720   UROBILINOGEN negative 04/13/2013 1030   UROBILINOGEN 0.2 08/12/2009 2205   NITRITE NEGATIVE 07/09/2017 0720   LEUKOCYTESUR NEGATIVE 07/09/2017 0720   Sepsis Labs: @LABRCNTIP (procalcitonin:4,lacticidven:4) )No results found for this or any previous visit (from the past 240 hour(s)).   Scheduled Meds: . allopurinol  100 mg Oral Daily  . apixaban  5 mg Oral BID  . atorvastatin  20 mg Oral q1800  . cholecalciferol  2,000 Units Oral Daily  . feeding supplement (ENSURE ENLIVE)  237 mL Oral BID BM  . feeding supplement (PRO-STAT SUGAR FREE 64)  30 mL Oral BID  . furosemide  40 mg Intravenous BID  . insulin aspart  0-5 Units Subcutaneous QHS  . insulin aspart  0-9 Units Subcutaneous TID WC  . insulin glargine  10 Units Subcutaneous Q2200  . isosorbide-hydrALAZINE   1 tablet Oral BID  . lisinopril  40 mg Oral Daily  . omega-3 acid ethyl esters  1 g Oral Daily  . sodium chloride flush  3 mL Intravenous Q12H   Continuous Infusions: . sodium chloride      Procedures/Studies: Dg Chest 2 View  Result Date: 07/08/2017 CLINICAL DATA:  84 y/o  M; lower leg and lower abdominal edema. EXAM: CHEST - 2 VIEW COMPARISON:  11/08/2016 chest radiograph. FINDINGS: Normal cardiac silhouette given projection and technique. Post median sternotomy and CABG. Calcific atherosclerosis of aorta. Moderate right and small left pleural effusions. Reticular opacities of the lungs probably representing interstitial edema. No acute osseous abnormality is evident. IMPRESSION: 1. Moderate right and small left pleural effusions. 2. Interstitial edema. 3. Aortic atherosclerosis. Electronically Signed   By: Mitzi Hansen M.D.   On: 07/08/2017 15:02   US Venous Img Lower Bilateral  Result Date: 07/08/2017 CLINICAL DATA:  Bilateral lower extremity edema right greater than EXAM: BILATERAL LOWER EXTREMITY VENOUS DOPPLER ULTRASOUND TECHNIQUE: Gray-scale sonography with graded compression, as well as color Doppler and duplex ultrasound  were performed to evaluate the lower extremity deep venous systems from the level of the common femoral vein and including the common femoral, femoral, profunda femoral, popliteal and calf veins including the posterior tibial, peroneal and gastrocnemius veins when visible. The superficial great saphenous vein was also interrogated. Spectral Doppler was utilized to evaluate flow at rest and with distal augmentation maneuvers in the common femoral, femoral and popliteal veins. COMPARISON:  None. FINDINGS: RIGHT LOWER EXTREMITY Common Femoral Vein: No evidence of thrombus. Normal compressibility, respiratory phasicity and response to augmentation. Saphenofemoral Junction: No evidence of thrombus. Normal compressibility and flow on color Doppler imaging. Profunda  Femoral Vein: No evidence of thrombus. Normal compressibility and flow on color Doppler imaging. Femoral Vein: No evidence of thrombus. Normal compressibility, respiratory phasicity and response to augmentation. Popliteal Vein: No evidence of thrombus. Normal compressibility, respiratory phasicity and response to augmentation. Calf Veins: No evidence of thrombus. Normal compressibility and flow on color Doppler imaging. Superficial Great Saphenous Vein: No evidence of thrombus. Normal compressibility. Venous Reflux:  None. Other Findings:  Subcutaneous edema noted. LEFT LOWER EXTREMITY Common Femoral Vein: No evidence of thrombus. Normal compressibility, respiratory phasicity and response to augmentation. Saphenofemoral Junction: No evidence of thrombus. Normal compressibility and flow on color Doppler imaging. Profunda Femoral Vein: No evidence of thrombus. Normal compressibility and flow on color Doppler imaging. Femoral Vein: No evidence of thrombus. Normal compressibility, respiratory phasicity and response to augmentation. Popliteal Vein: No evidence of thrombus. Normal compressibility, respiratory phasicity and response to augmentation. Calf Veins: No evidence of thrombus. Normal compressibility and flow on color Doppler imaging. Superficial Great Saphenous Vein: No evidence of thrombus. Normal compressibility. Venous Reflux:  None. Other Findings:  Subcutaneous edema evident. IMPRESSION: Negative for significant lower extremity DVT in either leg. Peripheral subcutaneous edema noted bilaterally. Electronically Signed   By: Judie PetitM.  Shick M.D.   On: 07/08/2017 17:05    Catarina Hartshornavid Brendyn Mclaren, DO  [facility] Pager [phone]  If 7PM-7AM, please contact night-coverage [email or web address] Password [email or web address] 07/12/2017, 4:02 PM   LOS: 4 days

## 2017-07-12 NOTE — Care Management Important Message (Signed)
Important Message  Patient Details  Name: Gilda CreaseHoward J Strickland MRN: [id number] Date of Birth: 02-24-1931   Medicare Important Message Given:  Yes    Renie OraHawkins, Taym Twist Smith 07/12/2017, 11:04 AM

## 2017-07-13 ENCOUNTER — Inpatient Hospital Stay (HOSPITAL_COMMUNITY): Payer: Medicare Other

## 2017-07-13 LAB — BASIC METABOLIC PANEL
ANION GAP: 6 (ref 5–15)
BUN: 30 mg/dL — ABNORMAL HIGH (ref 6–20)
CALCIUM: 8 mg/dL — AB (ref 8.9–10.3)
CO2: 34 mmol/L — ABNORMAL HIGH (ref 22–32)
CREATININE: 1.01 mg/dL (ref 0.61–1.24)
Chloride: 96 mmol/L — ABNORMAL LOW (ref 101–111)
Glucose, Bld: 105 mg/dL — ABNORMAL HIGH (ref 65–99)
Potassium: 4.3 mmol/L (ref 3.5–5.1)
Sodium: 136 mmol/L (ref 135–145)

## 2017-07-13 LAB — GLUCOSE, CAPILLARY
GLUCOSE-CAPILLARY: 76 mg/dL (ref 65–99)
GLUCOSE-CAPILLARY: 94 mg/dL (ref 65–99)
GLUCOSE-CAPILLARY: 158 mg/dL — AB (ref 65–99)
Glucose-Capillary: 92 mg/dL (ref 65–99)
Glucose-Capillary: 125 mg/dL — ABNORMAL HIGH (ref 65–99)
Glucose-Capillary: 100 mg/dL — ABNORMAL HIGH (ref 65–99)

## 2017-07-13 LAB — CBC
HCT: 34.9 % — ABNORMAL LOW (ref 39.0–52.0)
HEMOGLOBIN: 11.4 g/dL — AB (ref 13.0–17.0)
MCH: 31.9 pg (ref 26.0–34.0)
MCHC: 32.7 g/dL (ref 30.0–36.0)
MCV: 97.8 fL (ref 78.0–100.0)
PLATELETS: 161 10*3/uL (ref 150–400)
RBC: 3.57 MIL/uL — AB (ref 4.22–5.81)
RDW: 15.8 % — ABNORMAL HIGH (ref 11.5–15.5)
WBC: 6.1 10*3/uL (ref 4.0–10.5)

## 2017-07-13 MED ORDER — IOPAMIDOL (ISOVUE-300) INJECTION 61%
30.0000 mL | Freq: Once | INTRAVENOUS | Status: AC | PRN
  Administered 2017-07-13: 30 mL via ORAL

## 2017-07-13 MED ORDER — FUROSEMIDE 40 MG PO TABS
40.0000 mg | ORAL_TABLET | Freq: Every day | ORAL | Status: DC
  Administered 2017-07-14: 40 mg via ORAL
  Filled 2017-07-13: qty 1

## 2017-07-13 NOTE — Progress Notes (Signed)
PROGRESS NOTE  Chad [name] ZOX:[id number] DOB: 11-20-1931 DOA: 07/08/2017 PCP: Ernestina PennaMoore, Donald W, MD  Brief History: 84 year old male with a history of chronic atrial fibrillation, diabetes mellitus, hyperlipidemia, hypertension, stroke presenting with 1 week history of increasing lower extremity edema up to his thighs. The patient also complained of orthopnea type symptoms. He also complained of some scrotal edema. Upon presentation, the patient was noted to have a BNP of 1752. Chest x-ray showed interstitial edema with bilateral pleural effusions. The patient was started on intravenous furosemide with good clinical effect. Review of recordsshowed ACwas discontinued in 2015by his PCPas he was unable to afford Eliquis and declined Coumadin at that time. Reports that most of his medications are managed by his daughter who is a Engineer, civil (consulting)nurse.   Assessment/Plan: Acute on chronic systolic CHF -continue IV lasix--increase to bid dosing>>po lasix on 6/23 -NEG6.4 L -NEG6lbs -strict I/Os -07/10/2017 echo EF 35-40%, diffuse HK, AK mid inferolateral, PASP 78, moderate TR/MR  Chronic atrial fibrillation -Rate controlled -CHADSVASc = 6 -apixaban started  Lower extremity edema -Venous duplex negative for DVT  Essential hypertension -Continue BiDil and lisinopril -holding atenolol due to bradycardia  Diabetes mellitus type 2 -CBGs controlled -Continue Lantus 10 units daily -Continue NovoLog sliding scale  Hyperlipidemia -Continue statin  CAD -s/p prior CABGin the 1980'swith no recent cardiac evaluation since. -no chest pain -Troponins neg x 4 -EKG no ischemic changes  Suprapubic pain -UA--neg for pyuria -CT abd/pelvis     Disposition Plan: Home6/23 if stable and CT unremarkable Family Communication:No family present   Consultants:cardiology  Code Status: FULL  DVT Prophylaxis:  Lovenox   Procedures: As Listed in Progress  Note Above  Antibiotics: None      Subjective: Patient complains of some dysuria and suprapubic discomfort only with urination.  He denies any otherwise abdominal pain.  There is no hematochezia, diarrhea, melena, hematuria.  He denies any chest pain, shortness breath.  He is laying flat.  Objective: Vitals:   07/12/17 2039 07/12/17 2128 07/13/17 0500 07/13/17 1350  BP: (!) 102/40 (!) 122/58 120/72 (!) 106/52  Pulse: 70 64 70 68  Resp: 18  16 19   Temp: 98.4 F (36.9 C)  98.2 F (36.8 C) 98.3 F (36.8 C)  TempSrc: Oral  Oral   SpO2: 98%  100% 97%  Weight:   68.5 kg (151 lb 0.2 oz)   Height:        Intake/Output Summary (Last 24 hours) at 07/13/2017 1635 Last data filed at 07/13/2017 1500 Gross per 24 hour  Intake 1080 ml  Output 2250 ml  Net -1170 ml   Weight change:  Exam:   General:  Pt is alert, follows commands appropriately, not in acute distress  HEENT: No icterus, No thrush, No neck mass, [place]/AT  Cardiovascular: IRRR, S1/S2, no rubs, no gallops  Respiratory: Fine bibasilar crackles but no wheeze.  Good air movement.  Abdomen: Soft/+BS, mild suprapubic tender, non distended, no guarding  Extremities: trace LE edema, No lymphangitis, No petechiae, No rashes, no synovitis   Data Reviewed: I have personally reviewed following labs and imaging studies Basic Metabolic Panel: Recent Labs  Lab 07/09/17 0715 07/10/17 1008 07/11/17 0931 07/12/17 0524 07/13/17 0650  NA 137 138 138 135 136  K 4.0 3.7 3.8 4.2 4.3  CL 101 102 99* 98* 96*  CO2 31 31 31 31  34*  GLUCOSE 117* 164* 141* 115* 105*  BUN 19 20 26* 27* 30*  CREATININE 1.02 0.95 1.01 1.03 1.01  CALCIUM 7.8* 7.9* 8.4* 8.0* 8.0*   Liver Function Tests: Recent Labs  Lab 07/08/17 1541 07/09/17 0715  AST 22 21  ALT 19 18  ALKPHOS 121 101  BILITOT 0.8 0.4  PROT 6.2* 5.7*  ALBUMIN 2.4* 2.2*   No results for input(s): LIPASE, AMYLASE in the last 168 hours. No results for input(s): AMMONIA in  the last 168 hours. Coagulation Profile: No results for input(s): INR, PROTIME in the last 168 hours. CBC: Recent Labs  Lab 07/08/17 1440 07/09/17 0715 07/13/17 0650  WBC 6.5 5.8 6.1  NEUTROABS 3.8  --   --   HGB 13.5 12.4* 11.4*  HCT 41.2 37.8* 34.9*  MCV 99.3 99.0 97.8  PLT 208 185 161   Cardiac Enzymes: Recent Labs  Lab 07/08/17 1440 07/08/17 1939 07/09/17 0120 07/09/17 0715  TROPONINI <0.03 <0.03 <0.03 <0.03   BNP: Invalid input(s): POCBNP CBG: Recent Labs  Lab 07/12/17 2040 07/13/17 0255 07/13/17 0346 07/13/17 0733 07/13/17 1143  GLUCAP 213* 76 94 92 158*   HbA1C: No results for input(s): HGBA1C in the last 72 hours. Urine analysis:    Component Value Date/Time   COLORURINE STRAW (A) 07/12/2017 1940   APPEARANCEUR CLEAR 07/12/2017 1940   LABSPEC 1.006 07/12/2017 1940   PHURINE 8.0 07/12/2017 1940   GLUCOSEU NEGATIVE 07/12/2017 1940   HGBUR NEGATIVE 07/12/2017 1940   BILIRUBINUR NEGATIVE 07/12/2017 1940   BILIRUBINUR negative 04/13/2013 1030   KETONESUR NEGATIVE 07/12/2017 1940   PROTEINUR 30 (A) 07/12/2017 1940   UROBILINOGEN negative 04/13/2013 1030   UROBILINOGEN 0.2 08/12/2009 2205   NITRITE NEGATIVE 07/12/2017 1940   LEUKOCYTESUR NEGATIVE 07/12/2017 1940   Sepsis Labs: @LABRCNTIP (procalcitonin:4,lacticidven:4) )No results found for this or any previous visit (from the past 240 hour(s)).   Scheduled Meds: . allopurinol  100 mg Oral Daily  . apixaban  5 mg Oral BID  . atorvastatin  20 mg Oral q1800  . cholecalciferol  2,000 Units Oral Daily  . feeding supplement (ENSURE ENLIVE)  237 mL Oral BID BM  . feeding supplement (PRO-STAT SUGAR FREE 64)  30 mL Oral BID  . furosemide  40 mg Intravenous BID  . [START ON 07/14/2017] furosemide  40 mg Oral Daily  . insulin aspart  0-5 Units Subcutaneous QHS  . insulin aspart  0-9 Units Subcutaneous TID WC  . insulin glargine  10 Units Subcutaneous Q2200  . isosorbide-hydrALAZINE  1 tablet Oral BID  .  lisinopril  40 mg Oral Daily  . omega-3 acid ethyl esters  1 g Oral Daily  . sodium chloride flush  3 mL Intravenous Q12H   Continuous Infusions: . sodium chloride      Procedures/Studies: Dg Chest 2 View  Result Date: 07/08/2017 CLINICAL DATA:  84 y/o  M; lower leg and lower abdominal edema. EXAM: CHEST - 2 VIEW COMPARISON:  11/08/2016 chest radiograph. FINDINGS: Normal cardiac silhouette given projection and technique. Post median sternotomy and CABG. Calcific atherosclerosis of aorta. Moderate right and small left pleural effusions. Reticular opacities of the lungs probably representing interstitial edema. No acute osseous abnormality is evident. IMPRESSION: 1. Moderate right and small left pleural effusions. 2. Interstitial edema. 3. Aortic atherosclerosis. Electronically Signed   By: Mitzi Hansen M.D.   On: 07/08/2017 15:02   US Venous Img Lower Bilateral  Result Date: 07/08/2017 CLINICAL DATA:  Bilateral lower extremity edema right greater than EXAM: BILATERAL LOWER EXTREMITY VENOUS DOPPLER ULTRASOUND TECHNIQUE: Gray-scale sonography with graded compression, as  well as color Doppler and duplex ultrasound were performed to evaluate the lower extremity deep venous systems from the level of the common femoral vein and including the common femoral, femoral, profunda femoral, popliteal and calf veins including the posterior tibial, peroneal and gastrocnemius veins when visible. The superficial great saphenous vein was also interrogated. Spectral Doppler was utilized to evaluate flow at rest and with distal augmentation maneuvers in the common femoral, femoral and popliteal veins. COMPARISON:  None. FINDINGS: RIGHT LOWER EXTREMITY Common Femoral Vein: No evidence of thrombus. Normal compressibility, respiratory phasicity and response to augmentation. Saphenofemoral Junction: No evidence of thrombus. Normal compressibility and flow on color Doppler imaging. Profunda Femoral Vein: No  evidence of thrombus. Normal compressibility and flow on color Doppler imaging. Femoral Vein: No evidence of thrombus. Normal compressibility, respiratory phasicity and response to augmentation. Popliteal Vein: No evidence of thrombus. Normal compressibility, respiratory phasicity and response to augmentation. Calf Veins: No evidence of thrombus. Normal compressibility and flow on color Doppler imaging. Superficial Great Saphenous Vein: No evidence of thrombus. Normal compressibility. Venous Reflux:  None. Other Findings:  Subcutaneous edema noted. LEFT LOWER EXTREMITY Common Femoral Vein: No evidence of thrombus. Normal compressibility, respiratory phasicity and response to augmentation. Saphenofemoral Junction: No evidence of thrombus. Normal compressibility and flow on color Doppler imaging. Profunda Femoral Vein: No evidence of thrombus. Normal compressibility and flow on color Doppler imaging. Femoral Vein: No evidence of thrombus. Normal compressibility, respiratory phasicity and response to augmentation. Popliteal Vein: No evidence of thrombus. Normal compressibility, respiratory phasicity and response to augmentation. Calf Veins: No evidence of thrombus. Normal compressibility and flow on color Doppler imaging. Superficial Great Saphenous Vein: No evidence of thrombus. Normal compressibility. Venous Reflux:  None. Other Findings:  Subcutaneous edema evident. IMPRESSION: Negative for significant lower extremity DVT in either leg. Peripheral subcutaneous edema noted bilaterally. Electronically Signed   By: Judie Petit.  Shick M.D.   On: 07/08/2017 17:05    Catarina Hartshorn, DO  [facility] Pager [phone]  If 7PM-7AM, please contact night-coverage [email or web address] Password [email or web address] 07/13/2017, 4:35 PM   LOS: 5 days

## 2017-07-14 LAB — BASIC METABOLIC PANEL
Anion gap: 7 (ref 5–15)
BUN: 31 mg/dL — AB (ref 6–20)
CHLORIDE: 96 mmol/L — AB (ref 101–111)
CO2: 32 mmol/L (ref 22–32)
CREATININE: 1.07 mg/dL (ref 0.61–1.24)
Calcium: 8 mg/dL — ABNORMAL LOW (ref 8.9–10.3)
GLUCOSE: 117 mg/dL — AB (ref 65–99)
POTASSIUM: 4.8 mmol/L (ref 3.5–5.1)
SODIUM: 135 mmol/L (ref 135–145)

## 2017-07-14 LAB — GLUCOSE, CAPILLARY: Glucose-Capillary: 98 mg/dL (ref 65–99)

## 2017-07-14 MED ORDER — FUROSEMIDE 40 MG PO TABS
40.0000 mg | ORAL_TABLET | Freq: Once | ORAL | Status: AC
  Administered 2017-07-14: 40 mg via ORAL
  Filled 2017-07-14: qty 1

## 2017-07-14 MED ORDER — LISINOPRIL 10 MG PO TABS: 20.0000 mg | ORAL_TABLET | Freq: Every day | ORAL | Status: DC

## 2017-07-14 MED ORDER — FUROSEMIDE 40 MG PO TABS: 40.0000 mg | ORAL_TABLET | Freq: Once | ORAL | 1 refills | Status: DC

## 2017-07-14 MED ORDER — LISINOPRIL 20 MG PO TABS: 20.0000 mg | ORAL_TABLET | Freq: Every day | ORAL | 1 refills | Status: DC

## 2017-07-14 MED ORDER — APIXABAN 5 MG PO TABS: 5.0000 mg | ORAL_TABLET | Freq: Two times a day (BID) | ORAL | 0 refills | Status: DC

## 2017-07-14 MED ORDER — ISOSORB DINITRATE-HYDRALAZINE 20-37.5 MG PO TABS: 1.0000 | ORAL_TABLET | Freq: Two times a day (BID) | ORAL | 1 refills | Status: DC

## 2017-07-14 NOTE — Progress Notes (Signed)
Patient discharged to home taken to care via wheelchair.  Daughter and patient given discharge instruction and verbalized understanding.

## 2017-07-14 NOTE — Plan of Care (Signed)
Care plan complete with no concerns from patient.

## 2017-07-14 NOTE — Discharge Summary (Signed)
Physician Discharge Summary  Chad [name]:[id number] DOB: 10-09-1931 DOA: 07/08/2017  PCP: Ernestina Penna, MD  Admit date: 07/08/2017 Discharge date: 07/14/2017  Admitted From: Home Disposition:  Home   Recommendations for Outpatient Follow-up:  1. Follow up with PCP in 1-2 weeks 2. Please obtain BMP/CBC in one week   Discharge Condition: Stable CODE STATUS: FULL Diet recommendation: Heart Healthy / Carb Modified   Brief/Interim Summary: 84 year old male with a history of chronic atrial fibrillation, diabetes mellitus, hyperlipidemia, hypertension, stroke presenting with 1 week history of increasing lower extremity edema up to his thighs. The patient also complained of orthopnea type symptoms. He also complained of some scrotal edema. Upon presentation, the patient was noted to have a BNP of 1752. Chest x-ray showed interstitial edema with bilateral pleural effusions. The patient was started on intravenous furosemide with good clinical effect. Review of recordsshowed ACwas discontinued in 2015by his PCPas he was unable to afford Eliquis and declined Coumadin at that time. Reports that most of his medications are managed by his daughter who is a Engineer, civil (consulting).    Discharge Diagnoses:  Acute on chronic systolic CHF -continue IV lasix--increase to bid dosing>>po lasix on 6/23 -NEG7 L -NEG6lbs -strict I/Os -07/10/2017 echo EF 35-40%, diffuse HK, AK mid inferolateral, PASP 78, moderate TR/MR -discharge weight 137 -decreaase lisinopril due to rising K and change of lasix to po-->repeat BMP in one week  Chronic atrial fibrillation -Rate controlled -CHADSVASc = 6 -apixaban started  Lower extremity edema -Venous duplex negative for DVT  Essential hypertension -Continue BiDil and lisinopril -holding atenolol due to bradycardia  Diabetes mellitus type 2 -CBGs controlled -Continue Lantus 10 units daily -Continue NovoLog sliding scale  Hyperlipidemia -Continue  statin  CAD -s/p prior CABGin the 1980'swith no recent cardiac evaluation since. -no chest pain -Troponins neg x 4 -EKG no ischemic changes  Suprapubic pain -UA--neg for pyuria -Occurs only with urination -Etiology unclear, but improving clinically -CT abd/pelvis--moderate right, and small left pleural effusion, cholelithiasis without choledocholithiasis, mild left hydronephrosis with abrupt termination at the left UPVJ  Left hydronephrosis -Case discussed with urology, Dr. Sherald Barge previous CTs--> left hydronephrosis present previously 06/05/2015, unchanged--> in the setting of normal serum creatinine and lack of flank pain or back pain--> would monitor clinically and obtain serial CT abdomen if worsens clinically -No need for intervention surgically at this time   Discharge Instructions   Allergies as of 07/14/2017   No Known Allergies     Medication List    STOP taking these medications   atenolol 50 MG tablet Commonly known as:  TENORMIN     TAKE these medications   allopurinol 100 MG tablet Commonly known as:  ZYLOPRIM Take 100 mg by mouth daily.   apixaban 5 MG Tabs tablet Commonly known as:  ELIQUIS Take 1 tablet (5 mg total) by mouth 2 (two) times daily.   aspirin 81 MG tablet Take 81 mg by mouth daily.   atorvastatin 20 MG tablet Commonly known as:  LIPITOR TAKE 1 TABLET BY MOUTH ONCE DAILY AT  6  PM   fish oil-omega-3 fatty acids 1000 MG capsule Take 1 g by mouth daily.   furosemide 40 MG tablet Commonly known as:  LASIX Take 1 tablet (40 mg total) by mouth once for 1 dose. What changed:    medication strength  when to take this   glucose blood test strip Commonly known as:  ONE TOUCH ULTRA TEST Test Tid. Dx E11.9   glucose blood test strip  Commonly known as:  ONE TOUCH ULTRA TEST USE TO CHECK GLUCOSE THREE TIMES DAILY   Insulin Glargine 100 UNIT/ML Solostar Pen Commonly known as:  LANTUS SOLOSTAR Inject 10 Units into the skin  daily at 10 pm. What changed:  Another medication with the same name was removed. Continue taking this medication, and follow the directions you see here.   isosorbide-hydrALAZINE 20-37.5 MG tablet Commonly known as:  BIDIL Take 1 tablet by mouth 2 (two) times daily.   lisinopril 20 MG tablet Commonly known as:  PRINIVIL,ZESTRIL Take 1 tablet (20 mg total) by mouth daily. Start taking on:  07/15/2017 What changed:    medication strength  how much to take   onetouch ultrasoft lancets Test TId. DX E11.9   Pen Needles 32G X 4 MM Misc 1 each by Does not apply route daily. Use with pen to inject insulin QD (Dx: E10.65, E10.8 - IDDM, uncontrolled)   PROBIOTIC DAILY PO Take 1 capsule by mouth daily.   Vitamin D3 2000 units Tabs Take 2,000 Units by mouth daily.      Follow-up Information    Laqueta Linden, MD Follow up in 1 week(s).   Specialty:  Cardiology Contact information: [address] [place] [address] [phone]          No Known Allergies  Consultations:  Cardiology  Urology by phone   Procedures/Studies: Ct Abdomen Pelvis Wo Contrast  Result Date: 07/13/2017 CLINICAL DATA:  84 year old male with history of burning pain in the groin especially during urination. EXAM: CT ABDOMEN AND PELVIS WITHOUT CONTRAST TECHNIQUE: Multidetector CT imaging of the abdomen and pelvis was performed following the standard protocol without IV contrast. COMPARISON:  CT the abdomen and pelvis 06/05/2015. FINDINGS: Lower chest: Moderate right and small left pleural effusions lying dependently. Areas of passive atelectasis are noted in the right lower lobe. Atherosclerotic calcifications in the descending thoracic aorta as well as the left main, left anterior descending, left circumflex and right coronary arteries. Calcifications of the mitral annulus. Median sternotomy wires. Hepatobiliary: No definite cystic or solid hepatic lesions are confidently identified on today's  noncontrast CT examination. Numerous calcified gallstones are lying dependently in the gallbladder. No findings to suggest an acute cholecystitis at this time. Pancreas: No definite pancreatic mass or peripancreatic fluid or inflammatory changes are noted on today's noncontrast CT examination. Spleen: Unremarkable. Adrenals/Urinary Tract: No calcifications are noted within the collecting system of either kidney, along the course of either ureter, or within the lumen of the urinary bladder. Mild left hydronephrosis which abruptly terminates at the left ureteropelvic junction. No hydroureter. Unenhanced appearance of the urinary bladder is normal. Bilateral adrenal glands are normal in appearance. Stomach/Bowel: Unenhanced appearance of the stomach is normal. No pathologic dilatation of small bowel or colon. The appendix is not confidently identified and may be surgically absent. Regardless, there are no inflammatory changes noted adjacent to the cecum to suggest the presence of an acute appendicitis at this time. Vascular/Lymphatic: Aortic atherosclerosis. No lymphadenopathy noted in the abdomen or pelvis. Reproductive: Prostate gland and seminal vesicles are unremarkable in appearance. Other: No significant volume of ascites.  No pneumoperitoneum. Musculoskeletal: There are no aggressive appearing lytic or blastic lesions noted in the visualized portions of the skeleton. Median sternotomy wires. IMPRESSION: 1. Mild left-sided hydronephrosis which terminates at the left ureteropelvic junction, new compared to the prior study. No obstructing stone. This may suggest the presence of a left UPJ stricture. 2. No other acute findings are noted in  the abdomen or pelvis. 3. Colonic diverticulosis without evidence of acute diverticulitis at this time. 4. Moderate right and small left pleural effusions. 5. Aortic atherosclerosis, in addition to left main and 3 vessel coronary artery disease. Electronically Signed   By: Trudie Reed M.D.   On: 07/13/2017 19:21   Dg Chest 2 View  Result Date: 07/08/2017 CLINICAL DATA:  84 y/o  M; lower leg and lower abdominal edema. EXAM: CHEST - 2 VIEW COMPARISON:  11/08/2016 chest radiograph. FINDINGS: Normal cardiac silhouette given projection and technique. Post median sternotomy and CABG. Calcific atherosclerosis of aorta. Moderate right and small left pleural effusions. Reticular opacities of the lungs probably representing interstitial edema. No acute osseous abnormality is evident. IMPRESSION: 1. Moderate right and small left pleural effusions. 2. Interstitial edema. 3. Aortic atherosclerosis. Electronically Signed   By: Mitzi Hansen M.D.   On: 07/08/2017 15:02   US Venous Img Lower Bilateral  Result Date: 07/08/2017 CLINICAL DATA:  Bilateral lower extremity edema right greater than EXAM: BILATERAL LOWER EXTREMITY VENOUS DOPPLER ULTRASOUND TECHNIQUE: Gray-scale sonography with graded compression, as well as color Doppler and duplex ultrasound were performed to evaluate the lower extremity deep venous systems from the level of the common femoral vein and including the common femoral, femoral, profunda femoral, popliteal and calf veins including the posterior tibial, peroneal and gastrocnemius veins when visible. The superficial great saphenous vein was also interrogated. Spectral Doppler was utilized to evaluate flow at rest and with distal augmentation maneuvers in the common femoral, femoral and popliteal veins. COMPARISON:  None. FINDINGS: RIGHT LOWER EXTREMITY Common Femoral Vein: No evidence of thrombus. Normal compressibility, respiratory phasicity and response to augmentation. Saphenofemoral Junction: No evidence of thrombus. Normal compressibility and flow on color Doppler imaging. Profunda Femoral Vein: No evidence of thrombus. Normal compressibility and flow on color Doppler imaging. Femoral Vein: No evidence of thrombus. Normal compressibility, respiratory phasicity  and response to augmentation. Popliteal Vein: No evidence of thrombus. Normal compressibility, respiratory phasicity and response to augmentation. Calf Veins: No evidence of thrombus. Normal compressibility and flow on color Doppler imaging. Superficial Great Saphenous Vein: No evidence of thrombus. Normal compressibility. Venous Reflux:  None. Other Findings:  Subcutaneous edema noted. LEFT LOWER EXTREMITY Common Femoral Vein: No evidence of thrombus. Normal compressibility, respiratory phasicity and response to augmentation. Saphenofemoral Junction: No evidence of thrombus. Normal compressibility and flow on color Doppler imaging. Profunda Femoral Vein: No evidence of thrombus. Normal compressibility and flow on color Doppler imaging. Femoral Vein: No evidence of thrombus. Normal compressibility, respiratory phasicity and response to augmentation. Popliteal Vein: No evidence of thrombus. Normal compressibility, respiratory phasicity and response to augmentation. Calf Veins: No evidence of thrombus. Normal compressibility and flow on color Doppler imaging. Superficial Great Saphenous Vein: No evidence of thrombus. Normal compressibility. Venous Reflux:  None. Other Findings:  Subcutaneous edema evident. IMPRESSION: Negative for significant lower extremity DVT in either leg. Peripheral subcutaneous edema noted bilaterally. Electronically Signed   By: Judie Petit.  Shick M.D.   On: 07/08/2017 17:05        Discharge Exam: Vitals:   07/13/17 2219 07/14/17 0656  BP: (!) 123/59 112/74  Pulse: (!) 59 71  Resp: 18 18  Temp: 98.2 F (36.8 C) 98.2 F (36.8 C)  SpO2: 97%    Vitals:   07/13/17 1350 07/13/17 2219 07/14/17 0600 07/14/17 0656  BP: (!) 106/52 (!) 123/59  112/74  Pulse: 68 (!) 59  71  Resp: 19 18  18   Temp: 98.3 F (36.8  C) 98.2 F (36.8 C)  98.2 F (36.8 C)  TempSrc:  Oral  Oral  SpO2: 97% 97%    Weight:   62.5 kg (137 lb 12.6 oz)   Height:        General: Pt is alert, awake, not in acute  distress Cardiovascular: IRRR, S1/S2 +, no rubs, no gallops Respiratory: fine bibasilar cracklesbilaterally, no wheezing, no rhonchi Abdominal: Soft, NT, ND, bowel sounds + Extremities: trace LE edema, no cyanosis   The results of significant diagnostics from this hospitalization (including imaging, microbiology, ancillary and laboratory) are listed below for reference.    Significant Diagnostic Studies: Ct Abdomen Pelvis Wo Contrast  Result Date: 07/13/2017 CLINICAL DATA:  84 year old male with history of burning pain in the groin especially during urination. EXAM: CT ABDOMEN AND PELVIS WITHOUT CONTRAST TECHNIQUE: Multidetector CT imaging of the abdomen and pelvis was performed following the standard protocol without IV contrast. COMPARISON:  CT the abdomen and pelvis 06/05/2015. FINDINGS: Lower chest: Moderate right and small left pleural effusions lying dependently. Areas of passive atelectasis are noted in the right lower lobe. Atherosclerotic calcifications in the descending thoracic aorta as well as the left main, left anterior descending, left circumflex and right coronary arteries. Calcifications of the mitral annulus. Median sternotomy wires. Hepatobiliary: No definite cystic or solid hepatic lesions are confidently identified on today's noncontrast CT examination. Numerous calcified gallstones are lying dependently in the gallbladder. No findings to suggest an acute cholecystitis at this time. Pancreas: No definite pancreatic mass or peripancreatic fluid or inflammatory changes are noted on today's noncontrast CT examination. Spleen: Unremarkable. Adrenals/Urinary Tract: No calcifications are noted within the collecting system of either kidney, along the course of either ureter, or within the lumen of the urinary bladder. Mild left hydronephrosis which abruptly terminates at the left ureteropelvic junction. No hydroureter. Unenhanced appearance of the urinary bladder is normal. Bilateral  adrenal glands are normal in appearance. Stomach/Bowel: Unenhanced appearance of the stomach is normal. No pathologic dilatation of small bowel or colon. The appendix is not confidently identified and may be surgically absent. Regardless, there are no inflammatory changes noted adjacent to the cecum to suggest the presence of an acute appendicitis at this time. Vascular/Lymphatic: Aortic atherosclerosis. No lymphadenopathy noted in the abdomen or pelvis. Reproductive: Prostate gland and seminal vesicles are unremarkable in appearance. Other: No significant volume of ascites.  No pneumoperitoneum. Musculoskeletal: There are no aggressive appearing lytic or blastic lesions noted in the visualized portions of the skeleton. Median sternotomy wires. IMPRESSION: 1. Mild left-sided hydronephrosis which terminates at the left ureteropelvic junction, new compared to the prior study. No obstructing stone. This may suggest the presence of a left UPJ stricture. 2. No other acute findings are noted in the abdomen or pelvis. 3. Colonic diverticulosis without evidence of acute diverticulitis at this time. 4. Moderate right and small left pleural effusions. 5. Aortic atherosclerosis, in addition to left main and 3 vessel coronary artery disease. Electronically Signed   By: Trudie Reed M.D.   On: 07/13/2017 19:21   Dg Chest 2 View  Result Date: 07/08/2017 CLINICAL DATA:  84 y/o  M; lower leg and lower abdominal edema. EXAM: CHEST - 2 VIEW COMPARISON:  11/08/2016 chest radiograph. FINDINGS: Normal cardiac silhouette given projection and technique. Post median sternotomy and CABG. Calcific atherosclerosis of aorta. Moderate right and small left pleural effusions. Reticular opacities of the lungs probably representing interstitial edema. No acute osseous abnormality is evident. IMPRESSION: 1. Moderate right and small left pleural  effusions. 2. Interstitial edema. 3. Aortic atherosclerosis. Electronically Signed   By: Mitzi Hansen M.D.   On: 07/08/2017 15:02   US Venous Img Lower Bilateral  Result Date: 07/08/2017 CLINICAL DATA:  Bilateral lower extremity edema right greater than EXAM: BILATERAL LOWER EXTREMITY VENOUS DOPPLER ULTRASOUND TECHNIQUE: Gray-scale sonography with graded compression, as well as color Doppler and duplex ultrasound were performed to evaluate the lower extremity deep venous systems from the level of the common femoral vein and including the common femoral, femoral, profunda femoral, popliteal and calf veins including the posterior tibial, peroneal and gastrocnemius veins when visible. The superficial great saphenous vein was also interrogated. Spectral Doppler was utilized to evaluate flow at rest and with distal augmentation maneuvers in the common femoral, femoral and popliteal veins. COMPARISON:  None. FINDINGS: RIGHT LOWER EXTREMITY Common Femoral Vein: No evidence of thrombus. Normal compressibility, respiratory phasicity and response to augmentation. Saphenofemoral Junction: No evidence of thrombus. Normal compressibility and flow on color Doppler imaging. Profunda Femoral Vein: No evidence of thrombus. Normal compressibility and flow on color Doppler imaging. Femoral Vein: No evidence of thrombus. Normal compressibility, respiratory phasicity and response to augmentation. Popliteal Vein: No evidence of thrombus. Normal compressibility, respiratory phasicity and response to augmentation. Calf Veins: No evidence of thrombus. Normal compressibility and flow on color Doppler imaging. Superficial Great Saphenous Vein: No evidence of thrombus. Normal compressibility. Venous Reflux:  None. Other Findings:  Subcutaneous edema noted. LEFT LOWER EXTREMITY Common Femoral Vein: No evidence of thrombus. Normal compressibility, respiratory phasicity and response to augmentation. Saphenofemoral Junction: No evidence of thrombus. Normal compressibility and flow on color Doppler imaging. Profunda Femoral  Vein: No evidence of thrombus. Normal compressibility and flow on color Doppler imaging. Femoral Vein: No evidence of thrombus. Normal compressibility, respiratory phasicity and response to augmentation. Popliteal Vein: No evidence of thrombus. Normal compressibility, respiratory phasicity and response to augmentation. Calf Veins: No evidence of thrombus. Normal compressibility and flow on color Doppler imaging. Superficial Great Saphenous Vein: No evidence of thrombus. Normal compressibility. Venous Reflux:  None. Other Findings:  Subcutaneous edema evident. IMPRESSION: Negative for significant lower extremity DVT in either leg. Peripheral subcutaneous edema noted bilaterally. Electronically Signed   By: Judie Petit.  Shick M.D.   On: 07/08/2017 17:05     Microbiology: Recent Results (from the past 240 hour(s))  Culture, Urine     Status: None (Preliminary result)   Collection Time: 07/12/17  7:40 PM  Result Value Ref Range Status   Specimen Description   Final    URINE, CLEAN CATCH Performed at [facility], [address], [place], [place] [address]    Special Requests   Final    NONE Performed at [facility], [address] [place], [place] [address]    Culture   Final    CULTURE REINCUBATED FOR BETTER GROWTH Performed at [facility], [address], [place], [place] [address]    Report Status PENDING  Incomplete     Labs: Basic Metabolic Panel: Recent Labs  Lab 07/10/17 1008 07/11/17 0931 07/12/17 0524 07/13/17 0650 07/14/17 0651  NA 138 138 135 136 135  K 3.7 3.8 4.2 4.3 4.8  CL 102 99* 98* 96* 96*  CO2 31 31 31  34* 32  GLUCOSE 164* 141* 115* 105* 117*  BUN 20 26* 27* 30* 31*  CREATININE 0.95 1.01 1.03 1.01 1.07  CALCIUM 7.9* 8.4* 8.0* 8.0* 8.0*   Liver Function Tests: Recent Labs  Lab 07/08/17 1541 07/09/17 0715  AST 22  21  ALT 19 18  ALKPHOS 121 101  BILITOT 0.8 0.4  PROT 6.2* 5.7*  ALBUMIN 2.4* 2.2*   No results for input(s): LIPASE, AMYLASE in the last 168  hours. No results for input(s): AMMONIA in the last 168 hours. CBC: Recent Labs  Lab 07/08/17 1440 07/09/17 0715 07/13/17 0650  WBC 6.5 5.8 6.1  NEUTROABS 3.8  --   --   HGB 13.5 12.4* 11.4*  HCT 41.2 37.8* 34.9*  MCV 99.3 99.0 97.8  PLT 208 185 161   Cardiac Enzymes: Recent Labs  Lab 07/08/17 1440 07/08/17 1939 07/09/17 0120 07/09/17 0715  TROPONINI <0.03 <0.03 <0.03 <0.03   BNP: Invalid input(s): POCBNP CBG: Recent Labs  Lab 07/13/17 0733 07/13/17 1143 07/13/17 1649 07/13/17 2226 07/14/17 0744  GLUCAP 92 158* 125* 100* 98    Time coordinating discharge:  36 minutes  Signed:  Catarina Hartshornavid Daquawn Seelman, DO [facility] Pager: [phone] 07/14/2017, 11:45 AM

## 2017-07-15 ENCOUNTER — Telehealth: Payer: Self-pay | Admitting: *Deleted

## 2017-07-15 NOTE — Telephone Encounter (Signed)
Call Completed and Appointment Scheduled: Yes, Date: 07/18/2017 with Dr Dettinger   DISCHARGE INFORMATION Date of Discharge:07/14/17  [facility]  Principal Discharge Diagnosis: acute on chronic congestive heart failure  Patient and/or caregiver is knowledgeable of his/her condition(s) and treatment: Yes ,spoke with daughter  MEDICATION RECONCILIATION Current medication list reviewed with patient:Yes  Outpatient Encounter Medications as of 07/15/2017  Medication Sig  . allopurinol (ZYLOPRIM) 100 MG tablet Take 100 mg by mouth daily.  Marland Kitchen. apixaban (ELIQUIS) 5 MG TABS tablet Take 1 tablet (5 mg total) by mouth 2 (two) times daily.  Marland Kitchen. aspirin 81 MG tablet Take 81 mg by mouth daily.  Marland Kitchen. atorvastatin (LIPITOR) 20 MG tablet TAKE 1 TABLET BY MOUTH ONCE DAILY AT  6  PM  . Cholecalciferol (VITAMIN D3) 2000 units TABS Take 2,000 Units by mouth daily.  . fish oil-omega-3 fatty acids 1000 MG capsule Take 1 g by mouth daily.  . furosemide (LASIX) 40 MG tablet Take 1 tablet (40 mg total) by mouth once for 1 dose.  Marland Kitchen. glucose blood (ONE TOUCH ULTRA TEST) test strip Test Tid. Dx E11.9  . glucose blood (ONE TOUCH ULTRA TEST) test strip USE TO CHECK GLUCOSE THREE TIMES DAILY  . Insulin Glargine (LANTUS SOLOSTAR) 100 UNIT/ML Solostar Pen Inject 10 Units into the skin daily at 10 pm.  . Insulin Pen Needle (PEN NEEDLES) 32G X 4 MM MISC 1 each by Does not apply route daily. Use with pen to inject insulin QD (Dx: E10.65, E10.8 - IDDM, uncontrolled)  . isosorbide-hydrALAZINE (BIDIL) 20-37.5 MG tablet Take 1 tablet by mouth 2 (two) times daily.  . Lancets (ONETOUCH ULTRASOFT) lancets Test TId. DX E11.9  . lisinopril (PRINIVIL,ZESTRIL) 20 MG tablet Take 1 tablet (20 mg total) by mouth daily.  . Probiotic Product (PROBIOTIC DAILY PO) Take 1 capsule by mouth daily.   No facility-administered encounter medications on file as of 07/15/2017.     Discharge Medications reviewed and reconciled with  current medications.yes  Patient is able to obtain needed medications:No - Bidil needs prior authorization and has not been picked up  ACTIVITIES OF DAILY LIVING  Is the patient able to perform his/her own ADLs: Yes.    Patient is receiving home health services: No.  PATIENT EDUCATION Questions/Concerns Discussed: Bidil (isosorbide-hydralazine) may need prior authorization. Daughter said that pharmacy told her there was a problem with getting that medication. Prior authorization needed for formulary exception. Spoke with [facility] and they weren't able to tell me if anyone [place] working on the [place]. I went ahead and started the process on Cover My Meds and called daughter to let her know.

## 2017-07-16 LAB — URINE CULTURE

## 2017-07-16 NOTE — Telephone Encounter (Signed)
Prior Auth received for BiDil. Pharmacy notified. Attempted to contact daughter but no answer and no voicemail picked up.

## 2017-07-18 ENCOUNTER — Ambulatory Visit (INDEPENDENT_AMBULATORY_CARE_PROVIDER_SITE_OTHER): Payer: Medicare Other | Admitting: Family Medicine

## 2017-07-18 ENCOUNTER — Encounter: Payer: Self-pay | Admitting: Family Medicine

## 2017-07-18 VITALS — BP 104/55 | HR 75 | Temp 96.7°F | Ht 64.0 in | Wt 134.4 lb

## 2017-07-18 DIAGNOSIS — F419 Anxiety disorder, unspecified: Secondary | ICD-10-CM

## 2017-07-18 DIAGNOSIS — I509 Heart failure, unspecified: Secondary | ICD-10-CM

## 2017-07-18 DIAGNOSIS — F329 Major depressive disorder, single episode, unspecified: Secondary | ICD-10-CM

## 2017-07-18 DIAGNOSIS — I10 Essential (primary) hypertension: Secondary | ICD-10-CM | POA: Diagnosis not present

## 2017-07-18 DIAGNOSIS — E785 Hyperlipidemia, unspecified: Secondary | ICD-10-CM

## 2017-07-18 DIAGNOSIS — E1169 Type 2 diabetes mellitus with other specified complication: Secondary | ICD-10-CM

## 2017-07-18 DIAGNOSIS — F32A Depression, unspecified: Secondary | ICD-10-CM

## 2017-07-18 LAB — CBC WITH DIFFERENTIAL/PLATELET
BASOS ABS: 0 10*3/uL (ref 0.0–0.2)
Basos: 0 %
EOS (ABSOLUTE): 0 10*3/uL (ref 0.0–0.4)
Eos: 1 %
HEMOGLOBIN: 13.3 g/dL (ref 13.0–17.7)
Hematocrit: 40.8 % (ref 37.5–51.0)
Immature Grans (Abs): 0 10*3/uL (ref 0.0–0.1)
Immature Granulocytes: 0 %
LYMPHS: 30 %
Lymphocytes Absolute: 2 10*3/uL (ref 0.7–3.1)
MCH: 32.4 pg (ref 26.6–33.0)
MCHC: 32.6 g/dL (ref 31.5–35.7)
MCV: 99 fL — ABNORMAL HIGH (ref 79–97)
MONOCYTES: 7 %
Monocytes Absolute: 0.5 10*3/uL (ref 0.1–0.9)
NEUTROS ABS: 4.2 10*3/uL (ref 1.4–7.0)
Neutrophils: 62 %
Platelets: 201 10*3/uL (ref 150–450)
RBC: 4.11 x10E6/uL — AB (ref 4.14–5.80)
RDW: 16.6 % — ABNORMAL HIGH (ref 12.3–15.4)
WBC: 6.7 10*3/uL (ref 3.4–10.8)

## 2017-07-18 LAB — BMP8+EGFR
BUN/Creatinine Ratio: 31 — ABNORMAL HIGH (ref 10–24)
BUN: 34 mg/dL — ABNORMAL HIGH (ref 8–27)
CO2: 25 mmol/L (ref 20–29)
CREATININE: 1.09 mg/dL (ref 0.76–1.27)
Calcium: 8.5 mg/dL — ABNORMAL LOW (ref 8.6–10.2)
Chloride: 96 mmol/L (ref 96–106)
GFR calc Af Amer: 72 mL/min/{1.73_m2} (ref >59)
GFR calc non Af Amer: 62 mL/min/{1.73_m2} (ref >59)
GLUCOSE: 234 mg/dL — AB (ref 65–99)
Potassium: 5.7 mmol/L — ABNORMAL HIGH (ref 3.5–5.2)
Sodium: 134 mmol/L (ref 134–144)

## 2017-07-18 MED ORDER — MIRTAZAPINE 7.5 MG PO TABS: 7.5000 mg | ORAL_TABLET | Freq: Every day | ORAL | 1 refills | Status: DC

## 2017-07-18 NOTE — Progress Notes (Addendum)
HPI  Patient presents today for hospital follow-up/transitional medicine  Presenting to hospital with CHF with swelling up to the mid thigh orthopnea and x-ray showing pulmonary edema.  He was treated with IV Lasix and medications were adjusted.  Patient was discharged on 07/14/2017.   Lisinopril was decreased due to rising potassium.  He and his daughter present today, they state that he felt very dizzy after starting BiDil at home, he has stopped this over the last 2 days.  He states that his breathing and swelling is much better.  He has good diuresis and compliance with Lasix.  He states that his blood sugars are not as well-controlled as usual, less that he seen in the last few days is been 126, the highest has been 290s.  He is having more fluctuation in usual.  He also reports anxiety, he states is worse since leaving the hospital  Discharge summary reviewed, medications carefully reconciled and patient has good understanding of all of them.   PMH: Smoking status noted ROS: Per HPI  Objective: BP (!) 104/55   Pulse 75   Temp (!) 96.7 F (35.9 C) (Oral)   Ht 5' 4" (1.626 m)   Wt 134 lb 6.4 oz (61 kg)   BMI 23.07 kg/m  Gen: NAD, alert, cooperative with exam HEENT: NCAT, EOMI, PERRL CV: irregularly irregular Resp: CTABL, no wheezes, non-labored Ext: No edema Neuro: Alert and oriented, No gross deficits  Assessment and plan:  #CHF Refer to cardiology, appreciate the recommendations and management Appears euvolemic, patient has lost 3 more pounds since discharge weight of 137. Adequate diuresis Repeat labs  #Hypertension BP is soft today, patient and his daughter have chosen to discontinue BiDil which was continued on discharge. Considering blood pressure today and no chest pain I have recommended that they continue to hold this until discussion with cardiology Continue ACE inhibitor  #Type 2 diabetes Fluctuating blood sugars since coming home Patient is on  a low dose of Lantus, no adjustment at this time  #Anxiety and depression Patient states that he has had long-standing issues with anxiety He reports passive SI, he has actually asked his brother to remove all the guns from the house which she has done.  Patient contracts for safety and states that he is not planning to hurt himself today Starting  mirtazapine, he has difficulty sleeping, poor appetite, and anxiety depression-this should help with these multiple problems   Orders Placed This Encounter  Procedures  . CBC with Differential/Platelet  . BMP8+EGFR  . Ambulatory referral to Cardiology    Referral Priority:   Routine    Referral Type:   Consultation    Referral Reason:   Specialty Services Required    Referred to Provider:   Herminio Commons, MD    Requested Specialty:   Cardiology    Number of Visits Requested:   1    Meds ordered this encounter  Medications  . mirtazapine (REMERON) 7.5 MG tablet    Sig: Take 1 tablet (7.5 mg total) by mouth at bedtime.    Dispense:  30 tablet    Refill:  [place], MD [facility] 07/18/2017, 10:16 AM

## 2017-07-18 NOTE — Patient Instructions (Signed)
Great to see you!  It is ok to hold Bi-Dil until you see cardiology  Start mirtazapine 1 pill once daily at bedtime. Come back to see Dr. Christell ConstantMoore in 3-4 weeks

## 2017-07-21 DIAGNOSIS — E118 Type 2 diabetes mellitus with unspecified complications: Secondary | ICD-10-CM | POA: Diagnosis not present

## 2017-07-24 ENCOUNTER — Encounter: Payer: Self-pay | Admitting: Cardiology

## 2017-07-24 ENCOUNTER — Other Ambulatory Visit: Payer: Self-pay

## 2017-07-24 ENCOUNTER — Ambulatory Visit: Payer: Medicare Other | Admitting: Cardiology

## 2017-07-24 VITALS — BP 108/68 | HR 70 | Ht 66.0 in | Wt 135.0 lb

## 2017-07-24 DIAGNOSIS — I2510 Atherosclerotic heart disease of native coronary artery without angina pectoris: Secondary | ICD-10-CM

## 2017-07-24 DIAGNOSIS — I5022 Chronic systolic (congestive) heart failure: Secondary | ICD-10-CM

## 2017-07-24 DIAGNOSIS — I10 Essential (primary) hypertension: Secondary | ICD-10-CM

## 2017-07-24 NOTE — Progress Notes (Signed)
Clinical Summary Chad [name] is a 84 y.o.male seen for hospital follow up for the following medical problems.   1. Chronic  systolic HF - admit 06/2017 with LE edema and orthopnea - echo 06/2017 LVEF 35-40%. New diagnosis for patient.  - diuresed around 5 liters, discharge weight of 137 appears inaccurate as he would have had to have lost 14 lbs in a day.  - beta blocker stopped due to bradycardia.  - labs after discharge showed K up to 5.7, lisinopril 20mg  was stopped.    - taking lasix 40mg  daily.  - dizzy and nausea on bidil, stopped taking.   2. CAD - history of CABG in 1980s, unknown anatomy - no recent chest pain.   3. Afib - rate control has been complicated by episodes of bradycardia noted during 06/2017 admission, beta blocker stopped - This patients CHA2DS2-VASc Score and unadjusted Ischemic Stroke Rate (% per year) is equal to11.2 % stroke rate/year from a score of 8(CHF, HTN, DM, Vascular, Age (2), CVA (2)).He previously refused anticoag but has started eliquis.  - no recent palpitations.    4. HTN - compliant with meds  5. Hyperlipidemia - compliant with statin.   6. Hyperkalemia - K 5.7 on 07/18/17 labs    SH: his daughter is also a patient of mine.  Past Medical History:  Diagnosis Date  . 1st degree AV block   . Atrial fibrillation (HCC)   . CAD (coronary artery disease)    a. s/p CABG in 1980's (patient unsure of exact year or grafts)  . Diabetes mellitus without complication (HCC)   . Gout   . Hyperlipidemia   . Hypertension   . Neuropathy   . Stroke [facility](HCC)      No Known Allergies   Current Outpatient Medications  Medication Sig Dispense Refill  . allopurinol (ZYLOPRIM) 100 MG tablet Take 100 mg by mouth daily.    Marland Kitchen. apixaban (ELIQUIS) 5 MG TABS tablet Take 1 tablet (5 mg total) by mouth 2 (two) times daily. 60 tablet 0  . aspirin 81 MG tablet Take 81 mg by mouth daily.    Marland Kitchen. atorvastatin (LIPITOR) 20 MG tablet TAKE 1 TABLET BY MOUTH  ONCE DAILY AT  6  PM 90 tablet 0  . Cholecalciferol (VITAMIN D3) 2000 units TABS Take 2,000 Units by mouth daily.    . fish oil-omega-3 fatty acids 1000 MG capsule Take 1 g by mouth daily.    . furosemide (LASIX) 40 MG tablet Take 1 tablet (40 mg total) by mouth once for 1 dose. 30 tablet 1  . glucose blood (ONE TOUCH ULTRA TEST) test strip Test Tid. Dx E11.9 100 each 5  . glucose blood (ONE TOUCH ULTRA TEST) test strip USE TO CHECK GLUCOSE THREE TIMES DAILY 300 each 2  . Insulin Glargine (LANTUS SOLOSTAR) 100 UNIT/ML Solostar Pen Inject 10 Units into the skin daily at 10 pm. 3 mL 3  . Insulin Pen Needle (PEN NEEDLES) 32G X 4 MM MISC 1 each by Does not apply route daily. Use with pen to inject insulin QD (Dx: E10.65, E10.8 - IDDM, uncontrolled) 100 each 2  . isosorbide-hydrALAZINE (BIDIL) 20-37.5 MG tablet Take 1 tablet by mouth 2 (two) times daily. 30 tablet 1  . Lancets (ONETOUCH ULTRASOFT) lancets Test TId. DX E11.9 100 each 5  . lisinopril (PRINIVIL,ZESTRIL) 20 MG tablet Take 1 tablet (20 mg total) by mouth daily. 30 tablet 1  . mirtazapine (REMERON) 7.5 MG tablet Take  1 tablet (7.5 mg total) by mouth at bedtime. 30 tablet 1  . Probiotic Product (PROBIOTIC DAILY PO) Take 1 capsule by mouth daily.     No current facility-administered medications for this visit.      Past Surgical History:  Procedure Laterality Date  . carpel tunnel Bilateral   . CORONARY ARTERY BYPASS GRAFT     heart- x 6  . KIDNEY STONE SURGERY    . PARATHYROIDECTOMY       No Known Allergies    Family History  Problem Relation Age of Onset  . Cancer Father        throat  . CAD Brother 68       MI questionable, sudden death  . CAD Daughter 60       MI, stent     Social History Mr. Bradsher reports that he quit smoking about 40 years ago. His smoking use included cigarettes. He has never used smokeless tobacco. Mr. Bredeson reports that he does not drink alcohol.   Review of Systems CONSTITUTIONAL: No  weight loss, fever, chills, weakness or fatigue.  HEENT: Eyes: No visual loss, blurred vision, double vision or yellow sclerae.No hearing loss, sneezing, congestion, runny nose or sore throat.  SKIN: No rash or itching.  CARDIOVASCULAR: per hpi RESPIRATORY: per hpi  GASTROINTESTINAL: No anorexia, nausea, vomiting or diarrhea. No abdominal pain or blood.  GENITOURINARY: No burning on urination, no polyuria NEUROLOGICAL: No headache, dizziness, syncope, paralysis, ataxia, numbness or tingling in the extremities. No change in bowel or bladder control.  MUSCULOSKELETAL: No muscle, back pain, joint pain or stiffness.  LYMPHATICS: No enlarged nodes. No history of splenectomy.  PSYCHIATRIC: No history of depression or anxiety.  ENDOCRINOLOGIC: No reports of sweating, cold or heat intolerance. No polyuria or polydipsia.  Marland Kitchen   Physical Examination Vitals:   07/24/17 1011  BP: 108/68  Pulse: 70  SpO2: 96%   Vitals:   07/24/17 1011  Weight: 135 lb (61.2 kg)  Height: 5\' 6"  (1.676 m)    Gen: resting comfortably, no acute distress HEENT: no scleral icterus, pupils equal round and reactive, no palptable cervical adenopathy,  CV: RRR, no mr//g,n o jvd Resp: Clear to auscultation bilaterally GI: abdomen is soft, non-tender, non-distended, normal bowel sounds, no hepatosplenomegaly MSK: extremities are warm, no edema.  Skin: warm, no rash Neuro:  no focal deficits Psych: appropriate affect      Assessment and Plan  1. Chronic systolic HF - no current symptoms, continue medical therapy. Limited by recent hyperklaemia.  - rediscuss possibility for ICM and whether he would consider a cath at f/u. Today he was not in favor of.   2. CAD - no recent symptoms, continue current meds  3. HTN - at goal, continue current meds      Antoine Poche, M.D.

## 2017-07-24 NOTE — Patient Instructions (Addendum)
Your physician recommends that you schedule a follow-up appointment in: [facility]  Your physician recommends that you continue on your current medications as directed. Please refer to the Current Medication list given to you today.  Thank you for choosing  [facility]!!

## 2017-07-31 DIAGNOSIS — R350 Frequency of micturition: Secondary | ICD-10-CM | POA: Diagnosis not present

## 2017-07-31 DIAGNOSIS — Z87442 Personal history of urinary calculi: Secondary | ICD-10-CM | POA: Diagnosis not present

## 2017-07-31 DIAGNOSIS — N401 Enlarged prostate with lower urinary tract symptoms: Secondary | ICD-10-CM | POA: Diagnosis not present

## 2017-08-01 ENCOUNTER — Encounter: Payer: Self-pay | Admitting: Cardiology

## 2017-08-02 ENCOUNTER — Other Ambulatory Visit: Payer: Medicare Other

## 2017-08-02 DIAGNOSIS — E875 Hyperkalemia: Secondary | ICD-10-CM

## 2017-08-03 LAB — BMP8+EGFR
BUN/Creatinine Ratio: 27 — ABNORMAL HIGH (ref 10–24)
BUN: 26 mg/dL (ref 8–27)
CALCIUM: 8.6 mg/dL (ref 8.6–10.2)
CHLORIDE: 100 mmol/L (ref 96–106)
CO2: 26 mmol/L (ref 20–29)
CREATININE: 0.97 mg/dL (ref 0.76–1.27)
GFR calc Af Amer: 83 mL/min/{1.73_m2} (ref >59)
GFR calc non Af Amer: 71 mL/min/{1.73_m2} (ref >59)
GLUCOSE: 142 mg/dL — AB (ref 65–99)
Potassium: 4.6 mmol/L (ref 3.5–5.2)
Sodium: 139 mmol/L (ref 134–144)

## 2017-08-08 ENCOUNTER — Encounter: Payer: Self-pay | Admitting: Family Medicine

## 2017-08-08 ENCOUNTER — Ambulatory Visit (INDEPENDENT_AMBULATORY_CARE_PROVIDER_SITE_OTHER): Payer: Medicare Other | Admitting: Family Medicine

## 2017-08-08 VITALS — BP 126/62 | HR 77 | Temp 97.2°F | Ht 66.0 in | Wt 141.0 lb

## 2017-08-08 DIAGNOSIS — I872 Venous insufficiency (chronic) (peripheral): Secondary | ICD-10-CM | POA: Diagnosis not present

## 2017-08-08 DIAGNOSIS — F329 Major depressive disorder, single episode, unspecified: Secondary | ICD-10-CM

## 2017-08-08 DIAGNOSIS — F419 Anxiety disorder, unspecified: Secondary | ICD-10-CM

## 2017-08-08 DIAGNOSIS — F32A Depression, unspecified: Secondary | ICD-10-CM

## 2017-08-08 DIAGNOSIS — I509 Heart failure, unspecified: Secondary | ICD-10-CM

## 2017-08-08 MED ORDER — TRIAMCINOLONE ACETONIDE 0.5 % EX OINT: 1.0000 "application " | TOPICAL_OINTMENT | Freq: Two times a day (BID) | CUTANEOUS | 0 refills | Status: DC

## 2017-08-08 NOTE — Progress Notes (Signed)
   HPI  Patient presents today for follow-up.  Anxiety depression-patient started Remeron and states that he felt funny, he stopped after 1 pill, however he is feeling much better and states that he is happy, taking it with his friends and eating at [employer].  Patient states that Lasix is not causing adequate diuresis over the last couple of days.  He denies any shortness of breath or concerning worsening swelling.  He has had right greater than left leg swelling for quite a while.  He does note some itching and scaling of his right lower extremity  PMH: Smoking status noted ROS: Per HPI  Objective: BP 126/62   Pulse 77   Temp (!) 97.2 F (36.2 C) (Oral)   Ht 5\' 6"  (1.676 m)   Wt 141 lb (64 kg)   BMI 22.76 kg/m  Gen: NAD, alert, cooperative with exam HEENT: NCAT, EOMI, PERRL CV: RRR, good S1/S2, no murmur Resp: CTABL, no wheezes, non-labored Abd: SNTND, BS present, no guarding or organomegaly Ext:1 Plus pitting edema right greater than left slightly, venous stasis dermatitis on the right lower extremity Neuro: Alert and oriented, No gross deficits  Assessment and plan:  #CHF Patient is up 6 pounds since seeing cardiology 5 days ago He reports inadequate diuresis with 40 mg of Lasix, increase to 80 mg daily x3 days. Call back with weight  #Anxiety depression Improved, resolved No medication Follow-up as needed  Stasis dermatitis-Kenalog ointment sent  Murtis SinkSam Bradshaw, MD [facility] 08/08/2017, 9:08 AM

## 2017-08-08 NOTE — Patient Instructions (Signed)
Great to see you!  Increase Lasix to 80 mg once daily for the next 3 days, call us back on day with his weight.

## 2017-08-13 ENCOUNTER — Other Ambulatory Visit: Payer: Self-pay | Admitting: *Deleted

## 2017-08-13 MED ORDER — APIXABAN 5 MG PO TABS: 5.0000 mg | ORAL_TABLET | Freq: Two times a day (BID) | ORAL | 3 refills | Status: DC

## 2017-08-13 NOTE — Telephone Encounter (Signed)
Refilled eliquis.   Chad SinkSam Mita Vallo, MD [facility] 08/13/2017, 1:46 PM

## 2017-09-04 ENCOUNTER — Ambulatory Visit: Payer: Medicare Other | Admitting: Cardiology

## 2017-09-20 ENCOUNTER — Other Ambulatory Visit: Payer: Self-pay | Admitting: Family Medicine

## 2017-09-24 NOTE — Telephone Encounter (Signed)
Last lipid 10/16/16

## 2017-10-04 ENCOUNTER — Other Ambulatory Visit: Payer: Self-pay | Admitting: Family Medicine

## 2017-10-14 ENCOUNTER — Telehealth: Payer: Self-pay | Admitting: Family Medicine

## 2017-10-14 MED ORDER — FUROSEMIDE 40 MG PO TABS: 40.0000 mg | ORAL_TABLET | Freq: Every day | ORAL | 11 refills | Status: DC

## 2017-10-14 NOTE — Telephone Encounter (Signed)
Patient has called in Lasix 40mg  last week and we Denied it saying it had been discontinued on 07/03. Daughter it stating it was not and he can not go of this medication. Please advise

## 2017-10-14 NOTE — Telephone Encounter (Signed)
Daughter aware of RF

## 2017-10-22 ENCOUNTER — Other Ambulatory Visit: Payer: Self-pay | Admitting: Family Medicine

## 2017-10-31 ENCOUNTER — Ambulatory Visit (INDEPENDENT_AMBULATORY_CARE_PROVIDER_SITE_OTHER): Payer: Medicare Other

## 2017-10-31 DIAGNOSIS — Z23 Encounter for immunization: Secondary | ICD-10-CM | POA: Diagnosis not present

## 2017-11-26 ENCOUNTER — Other Ambulatory Visit: Payer: Self-pay | Admitting: Family Medicine

## 2017-12-06 ENCOUNTER — Other Ambulatory Visit: Payer: Self-pay | Admitting: *Deleted

## 2017-12-06 MED ORDER — TRIAMCINOLONE ACETONIDE 0.5 % EX OINT: 1.0000 "application " | TOPICAL_OINTMENT | Freq: Two times a day (BID) | CUTANEOUS | 0 refills | Status: DC

## 2017-12-06 MED ORDER — APIXABAN 5 MG PO TABS: 5.0000 mg | ORAL_TABLET | Freq: Two times a day (BID) | ORAL | 0 refills | Status: DC

## 2017-12-17 ENCOUNTER — Other Ambulatory Visit: Payer: Self-pay | Admitting: Family Medicine

## 2017-12-26 ENCOUNTER — Other Ambulatory Visit: Payer: Self-pay | Admitting: *Deleted

## 2017-12-26 NOTE — Telephone Encounter (Signed)
Moore. NTBS 30 days given 11/27/17

## 2017-12-27 NOTE — Telephone Encounter (Signed)
Pt aware needs appt but refused to schedule and states he will call back Monday to schedule.

## 2018-01-02 ENCOUNTER — Encounter: Payer: Self-pay | Admitting: Family Medicine

## 2018-01-02 ENCOUNTER — Ambulatory Visit: Payer: Medicare Other | Admitting: Family Medicine

## 2018-01-02 VITALS — BP 149/58 | HR 72 | Temp 97.8°F | Ht 66.0 in | Wt 149.2 lb

## 2018-01-02 DIAGNOSIS — E785 Hyperlipidemia, unspecified: Secondary | ICD-10-CM | POA: Diagnosis not present

## 2018-01-02 DIAGNOSIS — I5042 Chronic combined systolic (congestive) and diastolic (congestive) heart failure: Secondary | ICD-10-CM | POA: Diagnosis not present

## 2018-01-02 DIAGNOSIS — IMO0001 Reserved for inherently not codable concepts without codable children: Secondary | ICD-10-CM

## 2018-01-02 DIAGNOSIS — E1165 Type 2 diabetes mellitus with hyperglycemia: Secondary | ICD-10-CM

## 2018-01-02 DIAGNOSIS — Z794 Long term (current) use of insulin: Secondary | ICD-10-CM | POA: Diagnosis not present

## 2018-01-02 DIAGNOSIS — I10 Essential (primary) hypertension: Secondary | ICD-10-CM | POA: Diagnosis not present

## 2018-01-02 LAB — CBC WITH DIFFERENTIAL/PLATELET
Basophils Absolute: 0 10*3/uL (ref 0.0–0.2)
Basos: 1 %
EOS (ABSOLUTE): 0 10*3/uL (ref 0.0–0.4)
EOS: 0 %
HEMATOCRIT: 35.7 % — AB (ref 37.5–51.0)
Hemoglobin: 11.9 g/dL — ABNORMAL LOW (ref 13.0–17.7)
Immature Grans (Abs): 0.1 10*3/uL (ref 0.0–0.1)
Immature Granulocytes: 1 %
Lymphocytes Absolute: 1.7 10*3/uL (ref 0.7–3.1)
Lymphs: 28 %
MCH: 33.4 pg — ABNORMAL HIGH (ref 26.6–33.0)
MCHC: 33.3 g/dL (ref 31.5–35.7)
MCV: 100 fL — ABNORMAL HIGH (ref 79–97)
MONOS ABS: 0.5 10*3/uL (ref 0.1–0.9)
Monocytes: 9 %
Neutrophils Absolute: 3.6 10*3/uL (ref 1.4–7.0)
Neutrophils: 61 %
PLATELETS: 117 10*3/uL — AB (ref 150–450)
RBC: 3.56 x10E6/uL — AB (ref 4.14–5.80)
RDW: 14.2 % (ref 12.3–15.4)
WBC: 5.9 10*3/uL (ref 3.4–10.8)

## 2018-01-02 LAB — LIPID PANEL
CHOLESTEROL TOTAL: 141 mg/dL (ref 100–199)
Chol/HDL Ratio: 2.7 ratio (ref 0.0–5.0)
HDL: 53 mg/dL (ref >39)
LDL Calculated: 77 mg/dL (ref 0–99)
TRIGLYCERIDES: 57 mg/dL (ref 0–149)
VLDL Cholesterol Cal: 11 mg/dL (ref 5–40)

## 2018-01-02 LAB — CMP14+EGFR
A/G RATIO: 1.7 (ref 1.2–2.2)
ALK PHOS: 74 IU/L (ref 39–117)
ALT: 9 IU/L (ref 0–44)
AST: 16 IU/L (ref 0–40)
Albumin: 3.8 g/dL (ref 3.5–4.7)
BUN/Creatinine Ratio: 26 — ABNORMAL HIGH (ref 10–24)
BUN: 27 mg/dL (ref 8–27)
Bilirubin Total: 0.5 mg/dL (ref 0.0–1.2)
CO2: 25 mmol/L (ref 20–29)
CREATININE: 1.02 mg/dL (ref 0.76–1.27)
Calcium: 8.7 mg/dL (ref 8.6–10.2)
Chloride: 98 mmol/L (ref 96–106)
GFR calc Af Amer: 78 mL/min/{1.73_m2} (ref >59)
GFR calc non Af Amer: 67 mL/min/{1.73_m2} (ref >59)
GLOBULIN, TOTAL: 2.2 g/dL (ref 1.5–4.5)
Glucose: 173 mg/dL — ABNORMAL HIGH (ref 65–99)
POTASSIUM: 4.4 mmol/L (ref 3.5–5.2)
SODIUM: 138 mmol/L (ref 134–144)
Total Protein: 6 g/dL (ref 6.0–8.5)

## 2018-01-02 MED ORDER — ATORVASTATIN CALCIUM 20 MG PO TABS: ORAL_TABLET | ORAL | 3 refills | Status: DC

## 2018-01-02 MED ORDER — INSULIN GLARGINE 100 UNIT/ML SOLOSTAR PEN: 5.0000 [IU] | PEN_INJECTOR | Freq: Every day | SUBCUTANEOUS | 3 refills | Status: DC

## 2018-01-02 NOTE — Progress Notes (Signed)
BP (!) 149/58   Pulse 72   Temp 97.8 F (36.6 C) (Oral)   Ht 5' 6"  (1.676 m)   Wt 149 lb 3.2 oz (67.7 kg)   BMI 24.08 kg/m    Subjective:    Patient ID: Chad [name], male    DOB: [birth year], 84 y.o.   MRN: [id number]  HPI: Chad [name] is a 84 y.o. male presenting on 01/02/2018 for Diabetes (6 month follow up ) and Hypertension   HPI Type 2 diabetes mellitus Patient comes in today for recheck of his diabetes. Patient has been currently taking Lantus 10 units but not every day although some days based on the blood sugars. Patient is not currently on an ACE inhibitor/ARB. Patient has not seen an ophthalmologist this year. Patient denies any issues with their feet.   Hypertension Patient is currently on no medication currently because he stopped his lisinopril because of dizziness, and their blood pressure today is 149/58. Patient denies any lightheadedness or dizziness. Patient denies headaches, blurred vision, chest pains, shortness of breath, or weakness. Denies any side effects from medication and is content with current medication.   Hyperlipidemia Patient is coming in for recheck of his hyperlipidemia. The patient is currently taking fish oil. They deny any issues with myalgias or history of liver damage from it. They deny any focal numbness or weakness or chest pain.   CHF Patient carries a diagnosis of CHF but says that right now he is not having symptoms from it except for a little swelling in his right leg which is also where they remove the veins for stripping for another place.  He says he does not monitor his weights but he does take his fluid pill regularly.  Says he is feeling great and has no breathing troubles.  Relevant past medical, surgical, family and social history reviewed and updated as indicated. Interim medical history since our last visit reviewed. Allergies and medications reviewed and updated.  Review of Systems  Constitutional: Negative for  chills and fever.  Eyes: Negative for visual disturbance.  Respiratory: Negative for shortness of breath and wheezing.   Cardiovascular: Positive for leg swelling. Negative for chest pain.  Musculoskeletal: Negative for back pain and gait problem.  Skin: Negative for rash.  Neurological: Negative for dizziness, weakness, light-headedness and numbness.  All other systems reviewed and are negative.   Per HPI unless specifically indicated above   Allergies as of 01/02/2018   No Known Allergies     Medication List       Accurate as of January 02, 2018  9:22 AM. Always use your most recent med list.        allopurinol 100 MG tablet Commonly known as:  ZYLOPRIM Take 100 mg by mouth daily.   apixaban 5 MG Tabs tablet Commonly known as:  ELIQUIS Take 1 tablet (5 mg total) by mouth 2 (two) times daily.   atorvastatin 20 MG tablet Commonly known as:  LIPITOR TAKE 1 TABLET BY MOUTH ONCE DAILY AT 6 PM (Needs to be seen before next refill)   fish oil-omega-3 fatty acids 1000 MG capsule Take 1 g by mouth daily.   furosemide 40 MG tablet Commonly known as:  LASIX Take 1 tablet (40 mg total) by mouth daily.   glucose blood test strip Commonly known as:  ONE TOUCH ULTRA TEST Test Tid. Dx E11.9   glucose blood test strip Commonly known as:  ONE TOUCH ULTRA TEST USE TO CHECK GLUCOSE THREE  TIMES DAILY   Insulin Glargine 100 UNIT/ML Solostar Pen Commonly known as:  LANTUS SOLOSTAR Inject 5 Units into the skin daily.   onetouch ultrasoft lancets Test TId. DX E11.9   Pen Needles 32G X 4 MM Misc 1 each by Does not apply route daily. Use with pen to inject insulin QD (Dx: E10.65, E10.8 - IDDM, uncontrolled)   PROBIOTIC DAILY PO Take 1 capsule by mouth daily.   triamcinolone ointment 0.5 % Commonly known as:  KENALOG Apply 1 application topically 2 (two) times daily.   Vitamin D3 50 MCG (2000 UT) Tabs Take 2,000 Units by mouth daily.          Objective:    BP (!)  149/58   Pulse 72   Temp 97.8 F (36.6 C) (Oral)   Ht 5' 6"  (1.676 m)   Wt 149 lb 3.2 oz (67.7 kg)   BMI 24.08 kg/m   Wt Readings from Last 3 Encounters:  01/02/18 149 lb 3.2 oz (67.7 kg)  08/08/17 141 lb (64 kg)  07/24/17 135 lb (61.2 kg)    Physical Exam Vitals signs and nursing note reviewed.  Constitutional:      General: He is not in acute distress.    Appearance: He is well-developed. He is not diaphoretic.  Eyes:     General: No scleral icterus.    Conjunctiva/sclera: Conjunctivae normal.  Neck:     Musculoskeletal: Neck supple.     Thyroid: No thyromegaly.  Cardiovascular:     Rate and Rhythm: Normal rate and regular rhythm.     Heart sounds: Normal heart sounds. No murmur.  Pulmonary:     Effort: Pulmonary effort is normal. No respiratory distress.     Breath sounds: Normal breath sounds. No wheezing.  Musculoskeletal: Normal range of motion.  Lymphadenopathy:     Cervical: No cervical adenopathy.  Skin:    General: Skin is warm and dry.     Findings: No rash.  Neurological:     Mental Status: He is alert and oriented to person, place, and time.     Coordination: Coordination normal.  Psychiatric:        Behavior: Behavior normal.         Assessment & Plan:   Problem List Items Addressed This Visit      Cardiovascular and Mediastinum   HTN (hypertension)   Relevant Medications   atorvastatin (LIPITOR) 20 MG tablet   Other Relevant Orders   CMP14+EGFR   CHF (congestive heart failure) (HCC)   Relevant Medications   atorvastatin (LIPITOR) 20 MG tablet     Endocrine   Diabetes mellitus, insulin dependent (IDDM), uncontrolled (HCC) - Primary (Chronic)   Relevant Medications   atorvastatin (LIPITOR) 20 MG tablet   Insulin Glargine (LANTUS SOLOSTAR) 100 UNIT/ML Solostar Pen   Other Relevant Orders   CBC with Differential/Platelet   CMP14+EGFR   Bayer DCA Hb A1c Waived     Other   Hyperlipidemia LDL goal <70   Relevant Medications    atorvastatin (LIPITOR) 20 MG tablet   Other Relevant Orders   Lipid panel    Patient is not currently taking his Lantus every day and takes 10 units only some of the time when his sugars up, recommended that we lowered it to 5 units and take it every day and he will call if he gets any low blood sugars  Follow up plan: Return in about 6 months (around 07/04/2018), or if symptoms worsen or fail to improve,  for Diabetes recheck.  Counseling provided for all of the vaccine components Orders Placed This Encounter  Procedures  . CBC with Differential/Platelet  . CMP14+EGFR  . Lipid panel  . B[facility] Hb A1c Waived    Caryl Pina, MD [facility] 01/02/2018, 9:22 AM

## 2018-01-03 LAB — BAYER DCA HB A1C WAIVED: HB A1C: 7 % — AB (ref <7.0)

## 2018-01-08 ENCOUNTER — Other Ambulatory Visit: Payer: Self-pay | Admitting: Family Medicine

## 2018-02-06 ENCOUNTER — Encounter: Payer: Self-pay | Admitting: Family Medicine

## 2018-02-06 ENCOUNTER — Ambulatory Visit: Payer: Medicare Other | Admitting: Family Medicine

## 2018-02-06 VITALS — BP 139/65 | HR 63 | Temp 96.8°F | Ht 66.0 in | Wt 147.0 lb

## 2018-02-06 DIAGNOSIS — L309 Dermatitis, unspecified: Secondary | ICD-10-CM | POA: Diagnosis not present

## 2018-02-06 NOTE — Progress Notes (Signed)
Subjective:    Patient ID: Chad [name], male    DOB: [birth year], 85 y.o.   MRN: [id number]  Chief Complaint:  Itching on face and neck x 2 weeks, only at night   HPI: Chad [name] is a 85 y.o. male presenting on 02/06/2018 for Itching on face and neck x 2 weeks, only at night   1. Dermatitis   Pt presents today with complaints of a pruritic rash to his chin and neck. Pt states this started over 2 weeks ago and is only present at night. States he has a fine rash. Denies fever, chills, nuchal rigidity, chest pain, shortness of breath, trouble swallowing, throat swelling, or cough. States he has not changed any hygiene or household products. He does not have pets in the house. States he has not added any new medications. Denies anxiety or worry. He has not tired anything for the symptoms.    Relevant past medical, surgical, family, and social history reviewed and updated as indicated.  Allergies and medications reviewed and updated.   Past Medical History:  Diagnosis Date  . 1st degree AV block   . Atrial fibrillation ([place])   . CAD (coronary artery disease)    a. s/p CABG in 1980's (patient unsure of exact year or grafts)  . Diabetes mellitus without complication ([place])   . Gout   . Hyperlipidemia   . Hypertension   . Neuropathy   . Stroke [facility])     Past Surgical History:  Procedure Laterality Date  . carpel tunnel Bilateral   . CORONARY ARTERY BYPASS GRAFT     heart- x 6  . KIDNEY STONE SURGERY    . PARATHYROIDECTOMY      Social History   Socioeconomic History  . Marital status: Married    Spouse name: Not on file  . Number of children: 1  . Years of education: Not on file  . Highest education level: Not on file  Occupational History  . Not on file  Social Needs  . Financial resource strain: Not on file  . Food insecurity:    Worry: Not on file    Inability: Not on file  . Transportation needs:    Medical: Not on file    Non-medical: Not on file    Tobacco Use  . Smoking status: Former Smoker    Types: Cigarettes    Last attempt to quit: 06/12/1977    Years since quitting: 40.6  . Smokeless tobacco: Never Used  Substance and Sexual Activity  . Alcohol use: No  . Drug use: No  . Sexual activity: Not on file  Lifestyle  . Physical activity:    Days per week: Not on file    Minutes per session: Not on file  . Stress: Not on file  Relationships  . Social connections:    Talks on phone: Not on file    Gets together: Not on file    Attends religious service: Not on file    Active member of club or organization: Not on file    Attends meetings of clubs or organizations: Not on file    Relationship status: Not on file  . Intimate partner violence:    Fear of current or ex partner: Not on file    Emotionally abused: Not on file    Physically abused: Not on file    Forced sexual activity: Not on file  Other Topics Concern  . Not on file  Social History  Narrative  . Not on file    Outpatient Encounter Medications as of 02/06/2018  Medication Sig  . allopurinol (ZYLOPRIM) 100 MG tablet Take 100 mg by mouth daily.  Marland Kitchen atorvastatin (LIPITOR) 20 MG tablet TAKE 1 TABLET BY MOUTH ONCE DAILY AT 6 PM (Needs to be seen before next refill)  . Cholecalciferol (VITAMIN D3) 2000 units TABS Take 2,000 Units by mouth daily.  Marland Kitchen ELIQUIS 5 MG TABS tablet TAKE 1 TABLET BY MOUTH TWICE DAILY  . fish oil-omega-3 fatty acids 1000 MG capsule Take 1 g by mouth daily.  . furosemide (LASIX) 40 MG tablet Take 1 tablet (40 mg total) by mouth daily.  Marland Kitchen glucose blood (ONE TOUCH ULTRA TEST) test strip Test Tid. Dx E11.9  . glucose blood (ONE TOUCH ULTRA TEST) test strip USE TO CHECK GLUCOSE THREE TIMES DAILY  . Insulin Glargine (LANTUS SOLOSTAR) 100 UNIT/ML Solostar Pen Inject 5 Units into the skin daily.  . Insulin Pen Needle (PEN NEEDLES) 32G X 4 MM MISC 1 each by Does not apply route daily. Use with pen to inject insulin QD (Dx: E10.65, E10.8 - IDDM,  uncontrolled)  . Lancets (ONETOUCH ULTRASOFT) lancets Test TId. DX E11.9  . Probiotic Product (PROBIOTIC DAILY PO) Take 1 capsule by mouth daily.  Marland Kitchen triamcinolone ointment (KENALOG) 0.5 % Apply 1 application topically 2 (two) times daily.   No facility-administered encounter medications on file as of 02/06/2018.     No Known Allergies  Review of Systems  Constitutional: Negative for activity change, appetite change, chills, fatigue and fever.  HENT: Negative for facial swelling, sore throat, trouble swallowing and voice change.   Respiratory: Negative for cough, choking, chest tightness, shortness of breath and stridor.   Cardiovascular: Negative for chest pain and palpitations.  Skin: Positive for rash.  Allergic/Immunologic: Negative for environmental allergies and food allergies.  Neurological: Negative for weakness and headaches.  Psychiatric/Behavioral: Negative for confusion.  All other systems reviewed and are negative.       Objective:    BP 139/65   Pulse 63   Temp (!) 96.8 F (36 C) (Oral)   Ht 5' 6"  (1.676 m)   Wt 147 lb (66.7 kg)   SpO2 96%   BMI 23.73 kg/m    Wt Readings from Last 3 Encounters:  02/06/18 147 lb (66.7 kg)  01/02/18 149 lb 3.2 oz (67.7 kg)  08/08/17 141 lb (64 kg)    Physical Exam Vitals signs and nursing note reviewed.  Constitutional:      General: He is not in acute distress.    Appearance: Normal appearance.  HENT:     Head: Normocephalic and atraumatic.     Nose: Nose normal.     Mouth/Throat:     Mouth: Mucous membranes are moist.     Pharynx: Oropharynx is clear.  Eyes:     Extraocular Movements: Extraocular movements intact.     Conjunctiva/sclera: Conjunctivae normal.     Pupils: Pupils are equal, round, and reactive to light.  Neck:     Musculoskeletal: Normal range of motion and neck supple.  Cardiovascular:     Rate and Rhythm: Normal rate. Rhythm regularly irregular.     Heart sounds: Normal heart sounds. No murmur.  No friction rub. No gallop.   Pulmonary:     Effort: Pulmonary effort is normal. No respiratory distress.     Breath sounds: Normal breath sounds.  Skin:    General: Skin is warm and dry.  Capillary Refill: Capillary refill takes less than 2 seconds.     Findings: Rash present. Rash is papular (fine papular rash to chin and neck, no erythema).  Neurological:     Mental Status: He is alert and oriented to person, place, and time.  Psychiatric:        Mood and Affect: Mood normal.        Behavior: Behavior normal.        Thought Content: Thought content normal.        Judgment: Judgment normal.     Results for orders placed or performed in visit on 01/02/18  CBC with Differential/Platelet  Result Value Ref Range   WBC 5.9 3.4 - 10.8 x10E3/uL   RBC 3.56 (L) 4.14 - 5.80 x10E6/uL   Hemoglobin 11.9 (L) 13.0 - 17.7 g/dL   Hematocrit 35.7 (L) 37.5 - 51.0 %   MCV 100 (H) 79 - 97 fL   MCH 33.4 (H) 26.6 - 33.0 pg   MCHC 33.3 31.5 - 35.7 g/dL   RDW 14.2 12.3 - 15.4 %   Platelets 117 (L) 150 - 450 x10E3/uL   Neutrophils 61 Not Estab. %   Lymphs 28 Not Estab. %   Monocytes 9 Not Estab. %   Eos 0 Not Estab. %   Basos 1 Not Estab. %   Neutrophils Absolute 3.6 1.4 - 7.0 x10E3/uL   Lymphocytes Absolute 1.7 0.7 - 3.1 x10E3/uL   Monocytes Absolute 0.5 0.1 - 0.9 x10E3/uL   EOS (ABSOLUTE) 0.0 0.0 - 0.4 x10E3/uL   Basophils Absolute 0.0 0.0 - 0.2 x10E3/uL   Immature Granulocytes 1 Not Estab. %   Immature Grans (Abs) 0.1 0.0 - 0.1 x10E3/uL  CMP14+EGFR  Result Value Ref Range   Glucose 173 (H) 65 - 99 mg/dL   BUN 27 8 - 27 mg/dL   Creatinine, Ser 1.02 0.76 - 1.27 mg/dL   GFR calc non Af Amer 67 >59 mL/min/1.73   GFR calc Af Amer 78 >59 mL/min/1.73   BUN/Creatinine Ratio 26 (H) 10 - 24   Sodium 138 134 - 144 mmol/L   Potassium 4.4 3.5 - 5.2 mmol/L   Chloride 98 96 - 106 mmol/L   CO2 25 20 - 29 mmol/L   Calcium 8.7 8.6 - 10.2 mg/dL   Total Protein 6.0 6.0 - 8.5 g/dL   Albumin 3.8 3.5  - 4.7 g/dL   Globulin, Total 2.2 1.5 - 4.5 g/dL   Albumin/Globulin Ratio 1.7 1.2 - 2.2   Bilirubin Total 0.5 0.0 - 1.2 mg/dL   Alkaline Phosphatase 74 39 - 117 IU/L   AST 16 0 - 40 IU/L   ALT 9 0 - 44 IU/L  Lipid panel  Result Value Ref Range   Cholesterol, Total 141 100 - 199 mg/dL   Triglycerides 57 0 - 149 mg/dL   HDL 53 >39 mg/dL   VLDL Cholesterol Cal 11 5 - 40 mg/dL   LDL Calculated 77 0 - 99 mg/dL   Chol/HDL Ratio 2.7 0.0 - 5.0 ratio  Bayer DCA Hb A1c Waived  Result Value Ref Range   HB A1C (BAYER DCA - WAIVED) 7.0 (H) <7.0 %       Pertinent labs & imaging results that were available during my care of the patient were reviewed by me and considered in my medical decision making.  Assessment & Plan:  Rhen was seen today for itching on face and neck x 2 weeks, only at night.  Diagnoses and all orders  for this visit:  Dermatitis Discussed possible causes such as anxiety or dietary changes. Cool cloths to area. Can trial over the counter benadryl at night to see if beneficial. Report any new or worsening symptoms.    Continue all other maintenance medications.  Follow up plan: Return if symptoms worsen or fail to improve.  Educational handout given for contact dermatitis   The above assessment and management plan was discussed with the patient. The patient verbalized understanding of and has agreed to the management plan. Patient is aware to call the clinic if symptoms persist or worsen. Patient is aware when to return to the clinic for a follow-up visit. Patient educated on when it is appropriate to go to the emergency department.   Monia Pouch, FNP-C [facility] [phone]

## 2018-02-06 NOTE — Patient Instructions (Signed)
Contact Dermatitis  Dermatitis is redness, soreness, and swelling (inflammation) of the skin. Contact dermatitis is a reaction to something that touches the skin.  There are two types of contact dermatitis:   Irritant contact dermatitis. This happens when something bothers (irritates) your skin, like soap.   Allergic contact dermatitis. This is caused when you are exposed to something that you are allergic to, such as poison ivy.  What are the causes?   Common causes of irritant contact dermatitis include:  ? Makeup.  ? Soaps.  ? Detergents.  ? Bleaches.  ? Acids.  ? Metals, such as nickel.   Common causes of allergic contact dermatitis include:  ? Plants.  ? Chemicals.  ? Jewelry.  ? Latex.  ? Medicines.  ? Preservatives in products, such as clothing.  What increases the risk?   Having a job that exposes you to things that bother your skin.   Having asthma or eczema.  What are the signs or symptoms?  Symptoms may happen anywhere the irritant has touched your skin. Symptoms include:   Dry or flaky skin.   Redness.   Cracks.   Itching.   Pain or a burning feeling.   Blisters.   Blood or clear fluid draining from skin cracks.  With allergic contact dermatitis, swelling may occur. This may happen in places such as the eyelids, mouth, or genitals.  How is this treated?   This condition is treated by checking for the cause of the reaction and protecting your skin. Treatment may also include:  ? Steroid creams, ointments, or medicines.  ? Antibiotic medicines or other ointments, if you have a skin infection.  ? Lotion or medicines to help with itching.  ? A bandage (dressing).  Follow these instructions at home:  Skin care   Moisturize your skin as needed.   Put cool cloths on your skin.   Put a baking soda paste on your skin. Stir water into baking soda until it looks like a paste.   Do not scratch your skin.   Avoid having things rub up against your skin.   Avoid the use of soaps, perfumes, and  dyes.  Medicines   Take or apply over-the-counter and prescription medicines only as told by your doctor.   If you were prescribed an antibiotic medicine, take or apply it as told by your doctor. Do not stop using it even if your condition starts to get better.  Bathing   Take a bath with:  ? Epsom salts.  ? Baking soda.  ? Colloidal oatmeal.   Bathe less often.   Bathe in warm water. Avoid using hot water.  Bandage care   If you were given a bandage, change it as told by your health care provider.   Wash your hands with soap and water before and after you change your bandage. If soap and water are not available, use hand sanitizer.  General instructions   Avoid the things that caused your reaction. If you do not know what caused it, keep a journal. Write down:  ? What you eat.  ? What skin products you use.  ? What you drink.  ? What you wear in the area that has symptoms. This includes jewelry.   Check the affected areas every day for signs of infection. Check for:  ? More redness, swelling, or pain.  ? More fluid or blood.  ? Warmth.  ? Pus or a bad smell.   Keep all follow-up visits as   told by your doctor. This is important.  Contact a doctor if:   You do not get better with treatment.   Your condition gets worse.   You have signs of infection, such as:  ? More swelling.  ? Tenderness.  ? More redness.  ? Soreness.  ? Warmth.   You have a fever.   You have new symptoms.  Get help right away if:   You have a very bad headache.   You have neck pain.   Your neck is stiff.   You throw up (vomit).   You feel very sleepy.   You see red streaks coming from the area.   Your bone or joint near the area hurts after the skin has healed.   The area turns darker.   You have trouble breathing.  Summary   Dermatitis is redness, soreness, and swelling of the skin.   Symptoms may occur where the irritant has touched you.   Treatment may include medicines and skin care.   If you do not know what caused  your reaction, keep a journal.   Contact a doctor if your condition gets worse or you have signs of infection.  This information is not intended to replace advice given to you by your health care provider. Make sure you discuss any questions you have with your health care provider.  Document Released: 11/05/2008 Document Revised: 07/24/2017 Document Reviewed: 07/24/2017  [employer]vier Interactive Patient Education  2019 [employer]

## 2018-02-18 ENCOUNTER — Ambulatory Visit: Payer: Medicare Other | Admitting: *Deleted

## 2018-04-02 ENCOUNTER — Telehealth: Payer: Self-pay | Admitting: Family Medicine

## 2018-04-02 NOTE — Telephone Encounter (Signed)
PT daughter has called to see if we have samples of ELIQUIS 5 MG TABS tablet, please call her to let her know

## 2018-04-02 NOTE — Telephone Encounter (Signed)
Daughter aware we are out of eliquis samples at this time

## 2018-06-20 ENCOUNTER — Ambulatory Visit (INDEPENDENT_AMBULATORY_CARE_PROVIDER_SITE_OTHER): Payer: Medicare Other | Admitting: *Deleted

## 2018-06-20 ENCOUNTER — Other Ambulatory Visit: Payer: Self-pay

## 2018-06-20 VITALS — BP 110/70 | HR 75 | Ht 66.0 in | Wt 144.0 lb

## 2018-06-20 DIAGNOSIS — Z0000 Encounter for general adult medical examination without abnormal findings: Secondary | ICD-10-CM | POA: Diagnosis not present

## 2018-06-20 NOTE — Progress Notes (Signed)
MEDICARE ANNUAL WELLNESS VISIT  06/20/2018  Telephone Visit Disclaimer This Medicare AWV was conducted by telephone due to national recommendations for restrictions regarding the COVID-19 Pandemic (e.g. social distancing).  I verified, using two identifiers, that I am speaking with Chad [name] or their authorized healthcare agent. I discussed the limitations, risks, security, and privacy concerns of performing an evaluation and management service by telephone and the potential availability of an in-person appointment in the future. The patient expressed understanding and agreed to proceed.   Subjective:  Chad [name] is a 85 y.o. male patient of Dettinger, Elige RadonJoshua A, MD who had a Medicare Annual Wellness Visit today via telephone. Chad [name] is Retired and lives alone. he has 1 child. he reports that he is socially active and does not interact with friends/family regularly. he is minimally physically active and enjoys garden.  Patient Care Team: Dettinger, Elige RadonJoshua A, MD as PCP - General (Family Medicine) Rollene RotundaHochrein, James, MD as PCP - Cardiology (Cardiology) Corbin Adeourk, Robert M, MD as Consulting Physician (Gastroenterology)  Advanced Directives 06/20/2018 07/14/2017 07/09/2017 07/07/2015 07/06/2015 06/05/2015  Does Patient Have a Medical Advance Directive? No No Yes No No No  Type of Advance Directive - - Healthcare Power of Attorney - - -  Does patient want to make changes to medical advance directive? - No - Patient declined No - Patient declined - - -  Would patient like information on creating a medical advance directive? No - Patient declined No - Patient declined - No - patient declined information - No - patient declined information    Hospital Utilization Over the Past 12 Months: # of hospitalizations or ER visits: 0 # of surgeries: 0  Review of Systems    Patient reports that his overall health is better compared to last year.  Patient Reported Readings (BP, Pulse, CBG, Weight,  etc) BP 110/70   Pulse 75   Ht 5\' 6"  (1.676 m)   Wt 144 lb (65.3 kg)   BMI 23.24 kg/m    Review of Systems: General ROS: negative  All other systems negative.  Pain Assessment       Current Medications & Allergies (verified) Allergies as of 06/20/2018   No Known Allergies     Medication List       Accurate as of Jun 20, 2018  4:08 PM. If you have any questions, ask your nurse or doctor.        allopurinol 100 MG tablet Commonly known as:  ZYLOPRIM Take 100 mg by mouth daily.   atorvastatin 20 MG tablet Commonly known as:  LIPITOR TAKE 1 TABLET BY MOUTH ONCE DAILY AT 6 PM (Needs to be seen before next refill)   Eliquis 5 MG Tabs tablet Generic drug:  apixaban TAKE 1 TABLET BY MOUTH TWICE DAILY   fish oil-omega-3 fatty acids 1000 MG capsule Take 1 g by mouth daily.   furosemide 40 MG tablet Commonly known as:  LASIX Take 1 tablet (40 mg total) by mouth daily.   glucose blood test strip Commonly known as:  ONE TOUCH ULTRA TEST USE TO CHECK GLUCOSE THREE TIMES DAILY What changed:  Another medication with the same name was removed. Continue taking this medication, and follow the directions you see here.   Insulin Glargine 100 UNIT/ML Solostar Pen Commonly known as:  Lantus SoloStar Inject 5 Units into the skin daily.   onetouch ultrasoft lancets Test TId. DX E11.9   Pen Needles 32G X 4 MM Misc 1 each  by Does not apply route daily. Use with pen to inject insulin QD (Dx: E10.65, E10.8 - IDDM, uncontrolled)   PROBIOTIC DAILY PO Take 1 capsule by mouth daily.   triamcinolone ointment 0.5 % Commonly known as:  KENALOG Apply 1 application topically 2 (two) times daily.   Vitamin D3 50 MCG (2000 UT) Tabs Take 2,000 Units by mouth daily.       History (reviewed): Past Medical History:  Diagnosis Date  . 1st degree AV block   . Atrial fibrillation (HCC)   . CAD (coronary artery disease)    a. s/p CABG in 1980's (patient unsure of exact year or  grafts)  . Diabetes mellitus without complication (HCC)   . Gout   . Hyperlipidemia   . Hypertension   . Neuropathy   . Stroke [facility])    Past Surgical History:  Procedure Laterality Date  . carpel tunnel Bilateral   . CORONARY ARTERY BYPASS GRAFT     heart- x 6  . KIDNEY STONE SURGERY    . PARATHYROIDECTOMY     Family History  Problem Relation Age of Onset  . Cancer Father        throat  . Heart disease Father   . CAD Brother 23       MI questionable, sudden death  . CAD Daughter 11       MI, stent x 2   . CAD Brother   . Cancer Brother        lung   . CAD Brother   . CAD Brother   . CAD Brother   . CAD Brother   . CAD Brother    Social History   Socioeconomic History  . Marital status: Widowed    Spouse name: Not on file  . Number of children: 1  . Years of education: Not on file  . Highest education level: Not on file  Occupational History  . Occupation: retired     Comment: city [place]   Social Needs  . Financial resource strain: Not on file  . Food insecurity:    Worry: Not on file    Inability: Not on file  . Transportation needs:    Medical: Not on file    Non-medical: Not on file  Tobacco Use  . Smoking status: Former Smoker    Types: Cigarettes    Last attempt to quit: 06/12/1977    Years since quitting: 41.0  . Smokeless tobacco: Never Used  Substance and Sexual Activity  . Alcohol use: No  . Drug use: No  . Sexual activity: Not on file  Lifestyle  . Physical activity:    Days per week: Not on file    Minutes per session: Not on file  . Stress: Not on file  Relationships  . Social connections:    Talks on phone: Not on file    Gets together: Not on file    Attends religious service: Not on file    Active member of club or organization: Not on file    Attends meetings of clubs or organizations: Not on file    Relationship status: Not on file  Other Topics Concern  . Not on file  Social History Narrative  . Not on file     Activities of Daily Living In your present state of health, do you have any difficulty performing the following activities: 06/20/2018 07/14/2017  Hearing? Y -  Comment aids  -  Vision? Y -  Comment wears  glasses  -  Difficulty concentrating or making decisions? N -  Walking or climbing stairs? N -  Dressing or bathing? N -  Doing errands, shopping? N N  Preparing Food and eating ? N -  Using the Toilet? N -  In the past six months, have you accidently leaked urine? N -  Do you have problems with loss of bowel control? N -  Managing your Medications? Y -  Managing your Finances? Y -  Housekeeping or managing your Housekeeping? N -  Some recent data might be hidden    Patient Literacy    Exercise Current Exercise Habits: Home exercise routine, Type of exercise: walking, Time (Minutes): 20, Intensity: Mild, Exercise limited by: Other - see comments  Diet Patient reports consuming 3 meals a day and 2 snack(s) a day Patient reports that his primary diet is: Regular Patient reports that she does have regular access to food.   Depression Screen PHQ 2/9 Scores 06/20/2018 02/06/2018 01/02/2018 08/08/2017 07/18/2017 11/08/2016 10/16/2016  PHQ - 2 Score 0 0 0 0 6 0 0  PHQ- 9 Score - - - - 22 - -     Fall Risk Fall Risk  06/20/2018 06/20/2018 01/02/2018 08/08/2017 07/18/2017  Falls in the past year? 0 0 0 No No     Objective:  Chad Leigh Stach seemed alert and oriented and he participated appropriately during our telephone visit.  Blood Pressure Weight BMI  BP Readings from Last 3 Encounters:  06/20/18 110/70  02/06/18 139/65  01/02/18 (!) 149/58   Wt Readings from Last 3 Encounters:  06/20/18 144 lb (65.3 kg)  02/06/18 147 lb (66.7 kg)  01/02/18 149 lb 3.2 oz (67.7 kg)   BMI Readings from Last 1 Encounters:  06/20/18 23.24 kg/m    *Unable to obtain current vital signs, weight, and BMI due to telephone visit type  Hearing/Vision  . Peyton did not seem to have difficulty with  hearing/understanding during the telephone conversation . Reports that he has had a formal eye exam by an eye care professional within the past year . Reports that he has not had a formal hearing evaluation within the past year *Unable to fully assess hearing and vision during telephone visit type  Cognitive Function: 6CIT Screen 06/20/2018  What Year? 0 points  What month? 0 points  What time? 0 points  Count back from 20 0 points  Months in reverse 0 points  Repeat phrase 4 points  Total Score 4    Normal Cognitive Function Screening: Yes (Normal:0-7, Significant for Dysfunction: >8)  Immunization & Health Maintenance Record Immunization History  Administered Date(s) Administered  . Influenza, High Dose Seasonal PF 10/24/2015, 10/31/2017  . Influenza,inj,Quad PF,6+ Mos 11/12/2012, 11/10/2013, 11/04/2014  . Pneumococcal Conjugate-13 11/13/2012  . Td 02/01/2010  . Tdap 02/01/2010  . Zoster 05/19/2010    Health Maintenance  Topic Date Due  . PNA vac Low Risk Adult (2 of 2 - PPSV23) 11/13/2013  . URINE MICROALBUMIN  06/28/2016  . OPHTHALMOLOGY EXAM  11/08/2016  . FOOT EXAM  11/08/2017  . HEMOGLOBIN A1C  07/04/2018  . INFLUENZA VACCINE  08/23/2018  . TETANUS/TDAP  02/02/2020       Assessment  This is a routine wellness examination for Maston Wight Garner.  Health Maintenance: Due or Overdue Health Maintenance Due  Topic Date Due  . PNA vac Low Risk Adult (2 of 2 - PPSV23) 11/13/2013  . URINE MICROALBUMIN  06/28/2016  . OPHTHALMOLOGY EXAM  11/08/2016  .  FOOT EXAM  11/08/2017    Chad Leigh Mcneice does not need a referral for [employer] Assistance: Care Management:   no Social Work:    no Prescription Assistance:  no Nutrition/Diabetes Education:  no   Plan:  Personalized Goals Goals Addressed            This Visit's Progress   . stay active         Personalized Health Maintenance & Screening Recommendations  Pneumococcal vaccine   Lung Cancer Screening  Recommended: no (Low Dose CT Chest recommended if Age 3-80 years, 30 pack-year currently smoking OR have quit w/in past 15 years) Hepatitis C Screening recommended: no HIV Screening recommended: no  Advanced Directives: Written information was not prepared per patient's request.  Referrals & Orders No orders of the defined types were placed in this encounter.   Follow-up Plan . Follow-up with Dettinger, Elige Radon, MD as planned    I have personally reviewed and noted the following in the patient's chart:   . Medical and social history . Use of alcohol, tobacco or illicit drugs  . Current medications and supplements . Functional ability and status . Nutritional status . Physical activity . Advanced directives . List of other physicians . Hospitalizations, surgeries, and ER visits in previous 12 months . Vitals . Screenings to include cognitive, depression, and falls . Referrals and appointments  In addition, I have reviewed and discussed with Chad Leigh Borman certain preventive protocols, quality metrics, and best practice recommendations. A written personalized care plan for preventive services as well as general preventive health recommendations is available and can be mailed to the patient at his request.      Nora, Crick  06/20/2018

## 2018-06-20 NOTE — Patient Instructions (Signed)
  Mr. Chad [name] , Thank you for taking time to come for your Medicare Wellness Visit. I appreciate your ongoing commitment to your health goals. Please review the following plan we discussed and let me know if I can assist you in the future.   These are the goals we discussed: Goals    . stay active        This is a list of the screening recommended for you and due dates:  Health Maintenance  Topic Date Due  . Pneumonia vaccines (2 of 2 - PPSV23) 11/13/2013  . Urine Protein Check  06/28/2016  . Eye exam for diabetics  11/08/2016  . Complete foot exam   11/08/2017  . Hemoglobin A1C  07/04/2018  . Flu Shot  08/23/2018  . Tetanus Vaccine  02/02/2020

## 2018-07-04 ENCOUNTER — Ambulatory Visit: Payer: Medicare Other | Admitting: Family Medicine

## 2018-07-23 ENCOUNTER — Other Ambulatory Visit: Payer: Self-pay | Admitting: Family Medicine

## 2018-07-23 DIAGNOSIS — IMO0001 Reserved for inherently not codable concepts without codable children: Secondary | ICD-10-CM

## 2018-07-25 ENCOUNTER — Ambulatory Visit: Payer: Medicare Other | Admitting: Family Medicine

## 2018-08-13 ENCOUNTER — Other Ambulatory Visit: Payer: Self-pay | Admitting: Family Medicine

## 2018-08-14 ENCOUNTER — Ambulatory Visit (INDEPENDENT_AMBULATORY_CARE_PROVIDER_SITE_OTHER): Payer: Medicare Other | Admitting: Nurse Practitioner

## 2018-08-14 ENCOUNTER — Other Ambulatory Visit: Payer: Self-pay

## 2018-08-14 ENCOUNTER — Encounter: Payer: Self-pay | Admitting: Nurse Practitioner

## 2018-08-14 DIAGNOSIS — R197 Diarrhea, unspecified: Secondary | ICD-10-CM

## 2018-08-14 DIAGNOSIS — A0472 Enterocolitis due to Clostridium difficile, not specified as recurrent: Secondary | ICD-10-CM

## 2018-08-14 HISTORY — DX: Enterocolitis due to Clostridium difficile, not specified as recurrent: A04.72

## 2018-08-14 NOTE — Assessment & Plan Note (Signed)
Acute onset diarrhea with 6-7 stools a day that are generally watery.  History of C. difficile infection a couple years ago successfully treated with antibiotics.  He remains on a probiotic now and is still having profuse watery diarrhea.  No overt symptoms of systemic illness.  Denies fever, chills, weakness, fatigue.  Recommended stool studies including C. difficile and GI pathogen panel.  Continue probiotics.  Stay hydrated with plenty of fluids to prevent dehydration.  Follow-up seen in 4 weeks for further evaluation.  Call us if any worsening or severe symptoms.

## 2018-08-14 NOTE — Progress Notes (Signed)
cc'ed to pcp °

## 2018-08-14 NOTE — Progress Notes (Signed)
Referring Provider: Dettinger, Elige RadonJoshua A, MD Primary Care Physician:  Dettinger, Elige RadonJoshua A, MD Primary GI:  Dr.   NOTE: Service was provided via telemedicine and was requested by the patient due to COVID-19 pandemic.  Method of visit: Doxy.Me  Patient Location: Home  Provider Location: Office  Reason for Phone Visit: Acute onset diarrhea  The patient was consented to phone follow-up via telephone encounter including billing of the encounter (yes/no): Yes  Persons present on the phone encounter, with roles: Daughter  Total time (minutes) spent on medical discussion: 24 minutes  Chief Complaint  Patient presents with  . Diarrhea    5-6 times a day, no bleeding  . Abdominal Pain    below belly button, daily    HPI:   Chad [name] is a 85 y.o. male who presents for virtual visit regarding: recurrent diarrhea.  Patient was last seen in our office 11/28/2015 for C. difficile infection.  Previous positive test for C. difficile by PCR treated with Flagyl by another provider.  CT imaging at that time consistent with mild acute colitis.  After his antibiotic treatment he was having some postinfectious IBS type diarrhea symptoms and he was started on a probiotic and encouraged fluids.  At his last visit he noted no more diarrhea since probiotic, appetite improved, no other GI symptoms.  Recommended follow-up as needed.  Today he states he's doing ok overall. Diarrhea started about middle of last week. Mild lower abdominal pain associated, feels "like it's gas." Having about 6-7 times a day, watery "like just water comes out." Denies hematochezia, melena. Denies fever, chills. Denies weakness/fatigue. Denies N/V, unintentional weight loss (weighed yesterday 130.4 lbs). Denies URI or flu-like symptoms. Denies loss of sense of taste or smell. Denies chest pain, dyspnea, dizziness, lightheadedness, syncope, near syncope. Denies any other upper or lower GI symptoms. No recent healthcare  exposure, no recent antibiotics.  Past Medical History:  Diagnosis Date  . 1st degree AV block   . Atrial fibrillation (HCC)   . CAD (coronary artery disease)    a. s/p CABG in 1980's (patient unsure of exact year or grafts)  . Diabetes mellitus without complication (HCC)   . Gout   . Hyperlipidemia   . Hypertension   . Neuropathy   . Stroke [facility](HCC)     Past Surgical History:  Procedure Laterality Date  . carpel tunnel Bilateral   . CORONARY ARTERY BYPASS GRAFT     heart- x 6  . KIDNEY STONE SURGERY    . PARATHYROIDECTOMY      Current Outpatient Medications  Medication Sig Dispense Refill  . allopurinol (ZYLOPRIM) 100 MG tablet Take 100 mg by mouth daily.    Marland Kitchen. apixaban (ELIQUIS) 5 MG TABS tablet Take 1 tablet (5 mg total) by mouth 2 (two) times daily. (please make your 6 mos appt) 60 tablet 0  . atorvastatin (LIPITOR) 20 MG tablet TAKE 1 TABLET BY MOUTH ONCE DAILY AT 6 PM (Needs to be seen before next refill) 90 tablet 3  . Cholecalciferol (VITAMIN D3) 2000 units TABS Take 2,000 Units by mouth daily.    . fish oil-omega-3 fatty acids 1000 MG capsule Take 1 g by mouth daily.    . furosemide (LASIX) 40 MG tablet Take 1 tablet (40 mg total) by mouth daily. 30 tablet 11  . glucose blood (ONE TOUCH ULTRA TEST) test strip USE TO CHECK GLUCOSE THREE TIMES DAILY 300 each 2  . Insulin Pen Needle (PEN NEEDLES) 32G X  4 MM MISC 1 each by Does not apply route daily. Use with pen to inject insulin QD (Dx: E10.65, E10.8 - IDDM, uncontrolled) 100 each 2  . Lancets (ONETOUCH ULTRASOFT) lancets Test TId. DX E11.9 100 each 5  . LANTUS SOLOSTAR 100 UNIT/ML Solostar Pen INJECT 40 UNITS SUBCUTANEOUSLY AT BEDTIME 30 mL 0  . Probiotic Product (PROBIOTIC DAILY PO) Take 1 capsule by mouth daily.     No current facility-administered medications for this visit.     Allergies as of 08/14/2018  . (No Known Allergies)    Family History  Problem Relation Age of Onset  . Cancer Father        throat  .  Heart disease Father   . CAD Brother [id number]       MI questionable, sudden death  . CAD Daughter [id number]       MI, stent x 2   . CAD Brother   . Cancer Brother        lung   . CAD Brother   . CAD Brother   . CAD Brother   . CAD Brother   . CAD Brother     Social History   Socioeconomic History  . Marital status: Widowed    Spouse name: Not on file  . Number of children: 1  . Years of education: Not on file  . Highest education level: Not on file  Occupational History  . Occupation: retired     Comment: city [employer] [place]   Social Needs  . Financial resource strain: Not on file  . Food insecurity    Worry: Not on file    Inability: Not on file  . Transportation needs    Medical: Not on file    Non-medical: Not on file  Tobacco Use  . Smoking status: Former Smoker    Types: Cigarettes    Quit date: 06/12/1977    Years since quitting: 41.2  . Smokeless tobacco: Never Used  Substance and Sexual Activity  . Alcohol use: No  . Drug use: No  . Sexual activity: Not on file  Lifestyle  . Physical activity    Days per week: Not on file    Minutes per session: Not on file  . Stress: Not on file  Relationships  . Social Musicianconnections    Talks on phone: Not on file    Gets together: Not on file    Attends religious service: Not on file    Active member of club or organization: Not on file    Attends meetings of clubs or organizations: Not on file    Relationship status: Not on file  Other Topics Concern  . Not on file  Social History Narrative  . Not on file    Review of Systems: General: Negative for anorexia, weight loss, fever, chills, fatigue, weakness. ENT: Negative for hoarseness, difficulty swallowing. CV: Negative for chest pain, angina, palpitations, peripheral edema.  Respiratory: Negative for dyspnea at rest, dyspnea on exertion, cough, sputum, wheezing.  GI: See history of present illness. Endo: Negative for unusual weight change.  Heme: Negative for bruising or  bleeding. Allergy: Negative for rash or hives.  Physical Exam: Note: limited exam due to virtual visit General:   Alert and oriented. Pleasant and cooperative. Well-nourished and well-developed.  Head:  Normocephalic and atraumatic. Eyes:  Without icterus, sclera clear and conjunctiva pink.  Ears:  Normal auditory acuity. Skin:  Intact without facial significant lesions or rashes. Neurologic:  Alert and oriented  x4;  grossly normal neurologically. Psych:  Alert and cooperative. Normal mood and affect. Heme/Lymph/Immune: No excessive bruising noted.

## 2018-08-14 NOTE — Patient Instructions (Addendum)
Your health issues we discussed today were:   Diarrhea: 1. I am sorry you are having to go through diarrhea right now! 2. Pick up the stool kit from the lab soon as you are able to. 3. Provide a liquid stool sample from your bowel movement to the lab today we will test for infection 4. We will call you when we get the results. 5. Continue taking your probiotics 6. Stay hydrated with adequate fluids including water, sports drinks, Pedialyte. 7. Call us if you have any worsening or severe symptoms  Overall I recommend:  1. Continue your other current medications 2. We will have you follow-up in 4 weeks 3. Call us if you have any questions or concerns   Because of recent events of COVID-19 ("Coronavirus"), follow CDC recommendations:  1. Wash your hand frequently 2. Avoid touching your face 3. Stay away from people who are sick 4. If you have symptoms such as fever, cough, shortness of breath then call your healthcare provider for further guidance 5. If you are sick, STAY AT HOME unless otherwise directed by your healthcare provider. 6. Follow directions from state and national officials regarding staying safe   At [facility] we value your feedback. You may receive a survey about your visit today. Please share your experience as we strive to create trusting relationships with our patients to provide genuine, compassionate, quality care.  We appreciate your understanding and patience as we review any laboratory studies, imaging, and other diagnostic tests that are ordered as we care for you. Our office policy is 5 business days for review of these results, and any emergent or urgent results are addressed in a timely manner for your best interest. If you do not hear from our office in 1 week, please contact us.   We also encourage the use of MyChart, which contains your medical information for your review as well. If you are not enrolled in this feature, an access code is  on this after visit summary for your convenience. Thank you for allowing [place] to be involved in your care.  It was great to see you today!  I hope you have a great summer!!      Food Choices to Help Relieve Diarrhea, Adult When you have diarrhea, the foods you eat and your eating habits are very important. Choosing the right foods and drinks can help:  Relieve diarrhea.  Replace lost fluids and nutrients.  Prevent dehydration. What general guidelines should I follow?  Relieving diarrhea  Choose foods with less than 2 g or .07 oz. of fiber per serving.  Limit fats to less than 8 tsp (38 g or 1.34 oz.) a day.  Avoid the following: ? Foods and beverages sweetened with high-fructose corn syrup, honey, or sugar alcohols such as xylitol, sorbitol, and mannitol. ? Foods that contain a lot of fat or sugar. ? Fried, greasy, or spicy foods. ? High-fiber grains, breads, and cereals. ? Raw fruits and vegetables.  Eat foods that are rich in probiotics. These foods include dairy products such as yogurt and fermented milk products. They help increase healthy bacteria in the stomach and intestines (gastrointestinal tract, or GI tract).  If you have lactose intolerance, avoid dairy products. These may make your diarrhea worse.  Take medicine to help stop diarrhea (antidiarrheal medicine) only as told by your health care provider. Replacing nutrients  Eat small meals or snacks every 3-4 hours.  Eat bland foods, such as white rice, toast, or baked potato,  until your diarrhea starts to get better. Gradually reintroduce nutrient-rich foods as tolerated or as told by your health care provider. This includes: ? Well-cooked protein foods. ? Peeled, seeded, and soft-cooked fruits and vegetables. ? Low-fat dairy products.  Take vitamin and mineral supplements as told by your health care provider. Preventing dehydration  Start by sipping water or a special solution to prevent dehydration (oral  rehydration solution, ORS). Urine that is clear or pale yellow means that you are getting enough fluid.  Try to drink at least 8-10 cups of fluid each day to help replace lost fluids.  You may add other liquids in addition to water, such as clear juice or decaffeinated sports drinks, as tolerated or as told by your health care provider.  Avoid drinks with caffeine, such as coffee, tea, or soft drinks.  Avoid alcohol. What foods are recommended?     The items listed may not be a complete list. Talk with your health care provider about what dietary choices are best for you. Grains White rice. White, [place], or pita breads (fresh or toasted), including plain rolls, buns, or bagels. White pasta. Saltine, soda, or graham crackers. Pretzels. Low-fiber cereal. Cooked cereals made with water (such as cornmeal, farina, or cream cereals). Plain muffins. Matzo. Melba toast. Zwieback. Vegetables Potatoes (without the skin). Most well-cooked and canned vegetables without skins or seeds. Tender lettuce. Fruits Apple sauce. Fruits canned in juice. Cooked apricots, cherries, grapefruit, peaches, pears, or plums. Fresh bananas and cantaloupe. Meats and other protein foods Baked or boiled chicken. Eggs. Tofu. Fish. Seafood. Smooth nut butters. Ground or well-cooked tender beef, ham, veal, lamb, pork, or poultry. Dairy Plain yogurt, kefir, and unsweetened liquid yogurt. Lactose-free milk, buttermilk, skim milk, or soy milk. Low-fat or nonfat hard cheese. Beverages Water. Low-calorie sports drinks. Fruit juices without pulp. Strained tomato and vegetable juices. Decaffeinated teas. Sugar-free beverages not sweetened with sugar alcohols. Oral rehydration solutions, if approved by your health care provider. Seasoning and other foods Bouillon, broth, or soups made from recommended foods. What foods are not recommended? The items listed may not be a complete list. Talk with your health care provider about what  dietary choices are best for you. Grains Whole grain, whole wheat, bran, or rye breads, rolls, pastas, and crackers. Wild or brown rice. Whole grain or bran cereals. Barley. Oats and oatmeal. Corn tortillas or taco shells. Granola. Popcorn. Vegetables Raw vegetables. Fried vegetables. Cabbage, broccoli, Brussels sprouts, artichokes, baked beans, beet greens, corn, kale, legumes, peas, sweet potatoes, and yams. Potato skins. Cooked spinach and cabbage. Fruits Dried fruit, including raisins and dates. Raw fruits. Stewed or dried prunes. Canned fruits with syrup. Meat and other protein foods Fried or fatty meats. Deli meats. Chunky nut butters. Nuts and seeds. Beans and lentils. Berniece Salines. Hot dogs. Sausage. Dairy High-fat cheeses. Whole milk, chocolate milk, and beverages made with milk, such as milk shakes. Half-and-half. Cream. sour cream. Ice cream. Beverages Caffeinated beverages (such as coffee, tea, soda, or energy drinks). Alcoholic beverages. Fruit juices with pulp. Prune juice. Soft drinks sweetened with high-fructose corn syrup or sugar alcohols. High-calorie sports drinks. Fats and oils Butter. Cream sauces. Margarine. Salad oils. Plain salad dressings. Olives. Avocados. Mayonnaise. Sweets and desserts Sweet rolls, doughnuts, and sweet breads. Sugar-free desserts sweetened with sugar alcohols such as xylitol and sorbitol. Seasoning and other foods Honey. Hot sauce. Chili powder. Gravy. Cream-based or milk-based soups. Pancakes and waffles. Summary  When you have diarrhea, the foods you eat and your eating habits  are very important.  Make sure you get at least 8-10 cups of fluid each day, or enough to keep your urine clear or pale yellow.  Eat bland foods and gradually reintroduce healthy, nutrient-rich foods as tolerated, or as told by your health care provider.  Avoid high-fiber, fried, greasy, or spicy foods. This information is not intended to replace advice given to you by your  health care provider. Make sure you discuss any questions you have with your health care provider. Document Released: 03/31/2003 Document Revised: 05/01/2018 Document Reviewed: 01/06/2016 Elsevier Patient Education  2020 [employer].

## 2018-08-18 ENCOUNTER — Telehealth: Payer: Self-pay | Admitting: Internal Medicine

## 2018-08-18 NOTE — Telephone Encounter (Signed)
Spoke with pt's daughter. Pt's stool isn't loose at the moment. Pt will hold containers until his stool is loose again.

## 2018-08-18 NOTE — Telephone Encounter (Signed)
Noted. Daughter is aware

## 2018-08-18 NOTE — Telephone Encounter (Signed)
[phone] patient daughter melissa called with questions. Was told to do his stool test only if he had watery stool  He has not had any since his visit, should he keep the test kit and do it if he has water stools again?  °

## 2018-08-18 NOTE — Telephone Encounter (Signed)
Yes, agree. No further recommendations. I'm glad he seems to be improving.

## 2018-09-01 ENCOUNTER — Telehealth: Payer: Self-pay | Admitting: Internal Medicine

## 2018-09-01 NOTE — Telephone Encounter (Signed)
[name] said pt is having BM's so often he is afraid to leave the house.  She said she has not seen it but pt is describing it as "gravy consistency".  [name] is aware to check it out and if the stool is not formed to collect in containers they were given from the lab and submit to the lab.

## 2018-09-01 NOTE — Telephone Encounter (Signed)
[phone] patient daughter Lenna Sciara called about patient, he can not stop going to the bathroom and she does not know what she can do to help him

## 2018-09-02 DIAGNOSIS — R197 Diarrhea, unspecified: Secondary | ICD-10-CM | POA: Diagnosis not present

## 2018-09-02 NOTE — Telephone Encounter (Signed)
Noted, no further recommendations at this time. Would recommend completing the stool studies.

## 2018-09-02 NOTE — Telephone Encounter (Signed)
Per [name], they submitted the stools this morning. Best to call her with results since pt cannot hear well and doesn't answer phone sometimes.

## 2018-09-03 NOTE — Telephone Encounter (Signed)
Noted, will review results when they come back

## 2018-09-04 ENCOUNTER — Telehealth: Payer: Self-pay

## 2018-09-04 ENCOUNTER — Other Ambulatory Visit: Payer: Self-pay | Admitting: Nurse Practitioner

## 2018-09-04 DIAGNOSIS — A0472 Enterocolitis due to Clostridium difficile, not specified as recurrent: Secondary | ICD-10-CM

## 2018-09-04 LAB — CLOSTRIDIUM DIFFICILE TOXIN B, QUALITATIVE, REAL-TIME PCR: Toxigenic C. Difficile by PCR: DETECTED — AB

## 2018-09-04 LAB — GASTROINTESTINAL PATHOGEN PANEL PCR
C. difficile Tox A/B, PCR: DETECTED — AB
Campylobacter, PCR: NOT DETECTED
Cryptosporidium, PCR: NOT DETECTED
E coli (ETEC) LT/ST PCR: NOT DETECTED
E coli (STEC) stx1/stx2, PCR: NOT DETECTED
E coli 0157, PCR: NOT DETECTED
Giardia lamblia, PCR: NOT DETECTED
Norovirus, PCR: NOT DETECTED
Rotavirus A, PCR: NOT DETECTED
Salmonella, PCR: NOT DETECTED
Shigella, PCR: NOT DETECTED

## 2018-09-04 LAB — C. DIFFICILE GDH AND TOXIN A/B
GDH ANTIGEN: NOT DETECTED
MICRO NUMBER:: 759782
SPECIMEN QUALITY:: ADEQUATE
TOXIN A AND B: DETECTED

## 2018-09-04 MED ORDER — VANCOMYCIN HCL 125 MG PO CAPS: 125.0000 mg | ORAL_CAPSULE | Freq: Four times a day (QID) | ORAL | 0 refills | Status: AC

## 2018-09-04 NOTE — Telephone Encounter (Signed)
Spoke with pts daughter and she is going to get the medication needed. She spoke with her father prior to me calling and they will get the medication.

## 2018-09-04 NOTE — Telephone Encounter (Signed)
EG, Pt's daughter called. The Vancomycin is going to be over $100.00. pt would like a medication that's more affordable for him.

## 2018-09-04 NOTE — Telephone Encounter (Signed)
So there's generally one other recommended antibiotic for CDiff (Dificid). While I do not know what their cost would be, it is a new medication and is generally more expensive. I can send it in and see what it costs if they would like.

## 2018-09-04 NOTE — Progress Notes (Signed)
Vancomycin sent to pharmacy for CDiff. Last GFR>60.

## 2018-09-10 ENCOUNTER — Other Ambulatory Visit: Payer: Self-pay | Admitting: Family Medicine

## 2018-09-12 ENCOUNTER — Ambulatory Visit (INDEPENDENT_AMBULATORY_CARE_PROVIDER_SITE_OTHER): Payer: Medicare Other | Admitting: Nurse Practitioner

## 2018-09-12 ENCOUNTER — Encounter: Payer: Self-pay | Admitting: Nurse Practitioner

## 2018-09-12 ENCOUNTER — Other Ambulatory Visit: Payer: Self-pay

## 2018-09-12 DIAGNOSIS — R140 Abdominal distension (gaseous): Secondary | ICD-10-CM | POA: Diagnosis not present

## 2018-09-12 DIAGNOSIS — R630 Anorexia: Secondary | ICD-10-CM | POA: Diagnosis not present

## 2018-09-12 DIAGNOSIS — A498 Other bacterial infections of unspecified site: Secondary | ICD-10-CM | POA: Diagnosis not present

## 2018-09-12 NOTE — Assessment & Plan Note (Signed)
Notes poor appetite status post C. difficile infection and vancomycin treatment.  Recommend he continue to "eat to live".  Also recommended use of protein supplements, Ensure, boost, Glucerna to help maintain adequate nutrition.  At this time the patient and his daughter deny significant weight loss.  Continue to monitor weight, Ensure adequate nutrition, follow-up in 3 months.  I anticipate as he gets further passive C. difficile infection his appetite will likely return.

## 2018-09-12 NOTE — Assessment & Plan Note (Signed)
Documented CDiff infection treated with Vancomycin qid x 10 days. Diarrhea has since resolved and his stools are solidified.  Remains on a probiotic.  Residual gas and discomfort but no upper abdominal pain.  Recommend continue probiotic to help at all postinfectious IBS.  Call with any problems, specifically fever, significant abdominal pain, recurrent diarrhea.  Follow-up in 3 months.

## 2018-09-12 NOTE — Assessment & Plan Note (Signed)
Residual symptoms status post C. difficile of "gas".  Discussed causes some mild discomfort but no overt abdominal pain.  Recommend try simethicone over-the-counter.  Continue probiotics.  Monitor for worsening symptoms and notify us of any.  Follow-up in 3 months.

## 2018-09-12 NOTE — Patient Instructions (Addendum)
Your health issues we discussed today were:   C. Diff infection/diarrhea: 1. I am glad you are feeling better and the diarrhea has stopped 2. Call us if you have any more diarrhea or if he develops severe abdominal pain or fever  Gas and poor appetite: 1. Sometimes you can had an "unsettled stomach" after an infection like this 2. You can use simethicone (Gas-X or Beano) over-the-counter to help with your gas 3. Continue taking a probiotic which will help you recover 4. Make an effort to eat, even if you do not necessarily have an appetite, to help maintain your nutrition 5. If needed you can use protein drinks, Ensure, boost, Glucerna, or other options to help maintain your nutrition  Overall I recommend:  1. Continue your other current medications 2. Call us if you have any questions or concerns 3. Follow-up in 3 months.   Because of recent events of COVID-19 ("Coronavirus"), follow CDC recommendations:  1. Wash your hand frequently 2. Avoid touching your face 3. Stay away from people who are sick 4. If you have symptoms such as fever, cough, shortness of breath then call your healthcare provider for further guidance 5. If you are sick, STAY AT HOME unless otherwise directed by your healthcare provider. 6. Follow directions from state and national officials regarding staying safe   At [facility] we value your feedback. You may receive a survey about your visit today. Please share your experience as we strive to create trusting relationships with our patients to provide genuine, compassionate, quality care.  We appreciate your understanding and patience as we review any laboratory studies, imaging, and other diagnostic tests that are ordered as we care for you. Our office policy is 5 business days for review of these results, and any emergent or urgent results are addressed in a timely manner for your best interest. If you do not hear from our office in 1 week, please  contact us.   We also encourage the use of MyChart, which contains your medical information for your review as well. If you are not enrolled in this feature, an access code is on this after visit summary for your convenience. Thank you for allowing [place] to be involved in your care.  It was great to see you today!  I hope you have a great summer!!

## 2018-09-12 NOTE — Progress Notes (Signed)
Referring Provider: Dettinger, Fransisca Kaufmann, MD Primary Care Physician:  Dettinger, Fransisca Kaufmann, MD Primary GI:  Dr. Gala Romney  NOTE: Service was provided via telemedicine and was requested by the patient due to COVID-19 pandemic.  Method of visit: Doxy.Me  Patient Location: Home  Provider Location: Office  Reason for Phone Visit: Follow-up  The patient was consented to phone follow-up via telephone encounter including billing of the encounter (yes/no): Yes  Persons present on the phone encounter, with roles: Daughter  Total time (minutes) spent on medical discussion: 18 minutes  Chief Complaint  Patient presents with   Abdominal Pain    Alot of gas    HPI:   Chad [name] is a 85 y.o. male who presents for virtual visit regarding: Follow-up on C. difficile diarrhea.  Patient was last seen in our office 08/14/2018 for diarrhea of presumed infectious etiology.  Last visit was also a virtual office visit due to COVID-19/coronavirus pandemic.  Previous positive test by PCR for C. difficile and was treated with Flagyl single treatment.  CT imaging at that time consistent with mild acute colitis.  Posttreatment he developed postinfectious IBS type diarrhea and was started on a probiotic.  At his last visit he began having diarrhea in the middle of the week with mild lower abdominal pain that feels "like it is gas."  Noted 6-7 watery stools a day.  No other GI complaints.  Recommended stool studies, continue probiotics, adequate hydration, follow-up in 4 weeks.  His daughter called stating he was not having loose stools and she held the kit until he was.  She did submit stool studies on 09/01/2018 which found positive C. difficile on GI pathogen panel.  Toxin was also detected.  The patient was given handwashing guidelines and antiseptic guidelines.  He was prescribed vancomycin 125 mg 4 times a day for 10 days.  The patient's family called our office asking for a different antibiotic as it would  cost $100.  It was conveyed that the only other approved medication for C. difficile is Dificid and this would likely be more expensive.  It appears they did obtain and take the vancomycin.  Today he states he's doing ok overall. Has been having a lot of gas. No further diarrhea, stools. Denies abdominal pain, N/V, hematochezia, melena, fever, chills, unintentional weight loss. He has a lot of gas and poor appetite. Denies URI or flu-like symptoms. Denies loss of sense of taste or smell. Denies chest pain, dyspnea, dizziness, lightheadedness, syncope, near syncope. Denies any other upper or lower GI symptoms.  Past Medical History:  Diagnosis Date   1st degree AV block    Atrial fibrillation (HCC)    CAD (coronary artery disease)    a. s/p CABG in 1980's (patient unsure of exact year or grafts)   Diabetes mellitus without complication ([place])    Gout    Hyperlipidemia    Hypertension    Neuropathy    Stroke [facility])     Past Surgical History:  Procedure Laterality Date   carpel tunnel Bilateral    CORONARY ARTERY BYPASS GRAFT     heart- x 6   KIDNEY STONE SURGERY     PARATHYROIDECTOMY      Current Outpatient Medications  Medication Sig Dispense Refill   allopurinol (ZYLOPRIM) 100 MG tablet Take 100 mg by mouth daily.     atorvastatin (LIPITOR) 20 MG tablet TAKE 1 TABLET BY MOUTH ONCE DAILY AT 6 PM (Needs to be seen before next  refill) 90 tablet 3   Cholecalciferol (VITAMIN D3) 2000 units TABS Take 2,000 Units by mouth daily.     ELIQUIS 5 MG TABS tablet TAKE 1 TABLET BY MOUTH TWICE DAILY . 6 MONTH APPOINTMENT REQUIRED FOR FUTURE REFILLS 60 tablet 0   fish oil-omega-3 fatty acids 1000 MG capsule Take 1 g by mouth daily.     furosemide (LASIX) 40 MG tablet Take 1 tablet (40 mg total) by mouth daily. 30 tablet 11   glucose blood (ONE TOUCH ULTRA TEST) test strip USE TO CHECK GLUCOSE THREE TIMES DAILY 300 each 2   Insulin Pen Needle (PEN NEEDLES) 32G X 4 MM MISC 1 each  by Does not apply route daily. Use with pen to inject insulin QD (Dx: E10.65, E10.8 - IDDM, uncontrolled) 100 each 2   Lancets (ONETOUCH ULTRASOFT) lancets Test TId. DX E11.9 100 each 5   LANTUS SOLOSTAR 100 UNIT/ML Solostar Pen INJECT 40 UNITS SUBCUTANEOUSLY AT BEDTIME 30 mL 0   Probiotic Product (PROBIOTIC DAILY PO) Take 1 capsule by mouth daily.     vancomycin (VANCOCIN) 125 MG capsule Take 1 capsule (125 mg total) by mouth 4 (four) times daily for 10 days. 40 capsule 0   No current facility-administered medications for this visit.     Allergies as of 09/12/2018   (No Known Allergies)    Family History  Problem Relation Age of Onset   Cancer Father        throat   Heart disease Father    CAD Brother 52       MI questionable, sudden death   CAD Daughter 6       MI, stent x 2    CAD Brother    Cancer Brother        lung    CAD Brother    CAD Brother    CAD Brother    CAD Brother    CAD Brother     Social History   Socioeconomic History   Marital status: Widowed    Spouse name: Not on file   Number of children: 1   Years of education: Not on file   Highest education level: Not on file  Occupational History   Occupation: retired     Comment: city of [place] resource strain: Not on file   Food insecurity    Worry: Not on file    Inability: Not on file   Transportation needs    Medical: Not on file    Non-medical: Not on file  Tobacco Use   Smoking status: Former Smoker    Types: Cigarettes    Quit date: 06/12/1977    Years since quitting: 41.2   Smokeless tobacco: Never Used  Substance and Sexual Activity   Alcohol use: No   Drug use: No   Sexual activity: Not on file  Lifestyle   Physical activity    Days per week: Not on file    Minutes per session: Not on file   Stress: Not on file  Relationships   Social connections    Talks on phone: Not on file    Gets together: Not on file    Attends  religious service: Not on file    Active member of club or organization: Not on file    Attends meetings of clubs or organizations: Not on file    Relationship status: Not on file  Other Topics Concern   Not on file  Social History Narrative   Not on file    Review of Systems: General: Negative for anorexia, weight loss, fever, chills, fatigue, weakness. Eyes: Negative for vision changes.  ENT: Negative for hoarseness, difficulty swallowing , nasal congestion. CV: Negative for chest pain, angina, palpitations, dyspnea on exertion, peripheral edema.  Respiratory: Negative for dyspnea at rest, dyspnea on exertion, cough, sputum, wheezing.  GI: See history of present illness. GU:  Negative for dysuria, hematuria, urinary incontinence, urinary frequency, nocturnal urination.  MS: Negative for joint pain, low back pain.  Derm: Negative for rash or itching.  Neuro: Negative for weakness, abnormal sensation, seizure, frequent headaches, memory loss, confusion.  Psych: Negative for anxiety, depression, suicidal ideation, hallucinations.  Endo: Negative for unusual weight change.  Heme: Negative for bruising or bleeding. Allergy: Negative for rash or hives.  Physical Exam: Note: limited exam due to virtual visit General:   Alert and oriented. Pleasant and cooperative. Well-nourished and well-developed.  Head:  Normocephalic and atraumatic. Eyes:  Without icterus, sclera clear and conjunctiva pink.  Ears:  Normal auditory acuity. Skin:  Intact without facial significant lesions or rashes. Neurologic:  Alert and oriented x4;  grossly normal neurologically. Psych:  Alert and cooperative. Normal mood and affect. Heme/Lymph/Immune: No excessive bruising noted.

## 2018-09-15 ENCOUNTER — Other Ambulatory Visit: Payer: Self-pay

## 2018-09-15 ENCOUNTER — Encounter: Payer: Self-pay | Admitting: Internal Medicine

## 2018-09-16 ENCOUNTER — Encounter: Payer: Self-pay | Admitting: Physician Assistant

## 2018-09-16 ENCOUNTER — Ambulatory Visit (INDEPENDENT_AMBULATORY_CARE_PROVIDER_SITE_OTHER): Payer: Medicare Other | Admitting: Physician Assistant

## 2018-09-16 VITALS — BP 159/58 | HR 63 | Temp 98.7°F | Ht 66.0 in | Wt 148.8 lb

## 2018-09-16 DIAGNOSIS — I509 Heart failure, unspecified: Secondary | ICD-10-CM | POA: Diagnosis not present

## 2018-09-16 DIAGNOSIS — Z794 Long term (current) use of insulin: Secondary | ICD-10-CM | POA: Diagnosis not present

## 2018-09-16 DIAGNOSIS — IMO0001 Reserved for inherently not codable concepts without codable children: Secondary | ICD-10-CM

## 2018-09-16 DIAGNOSIS — E1165 Type 2 diabetes mellitus with hyperglycemia: Secondary | ICD-10-CM | POA: Diagnosis not present

## 2018-09-16 DIAGNOSIS — I10 Essential (primary) hypertension: Secondary | ICD-10-CM | POA: Diagnosis not present

## 2018-09-16 LAB — BAYER DCA HB A1C WAIVED: HB A1C (BAYER DCA - WAIVED): 7.9 % — ABNORMAL HIGH (ref <7.0)

## 2018-09-16 MED ORDER — LANTUS SOLOSTAR 100 UNIT/ML ~~LOC~~ SOPN: 5.0000 [IU] | PEN_INJECTOR | Freq: Every day | SUBCUTANEOUS | 0 refills | Status: DC

## 2018-09-17 LAB — CBC WITH DIFFERENTIAL/PLATELET
Basophils Absolute: 0 10*3/uL (ref 0.0–0.2)
Basos: 0 %
EOS (ABSOLUTE): 0 10*3/uL (ref 0.0–0.4)
Eos: 0 %
Hematocrit: 30 % — ABNORMAL LOW (ref 37.5–51.0)
Hemoglobin: 10.2 g/dL — ABNORMAL LOW (ref 13.0–17.7)
Immature Grans (Abs): 0 10*3/uL (ref 0.0–0.1)
Immature Granulocytes: 1 %
Lymphocytes Absolute: 0.9 10*3/uL (ref 0.7–3.1)
Lymphs: 21 %
MCH: 33.1 pg — ABNORMAL HIGH (ref 26.6–33.0)
MCHC: 34 g/dL (ref 31.5–35.7)
MCV: 97 fL (ref 79–97)
Monocytes Absolute: 0.6 10*3/uL (ref 0.1–0.9)
Monocytes: 13 %
Neutrophils Absolute: 2.7 10*3/uL (ref 1.4–7.0)
Neutrophils: 65 %
Platelets: 163 10*3/uL (ref 150–450)
RBC: 3.08 x10E6/uL — ABNORMAL LOW (ref 4.14–5.80)
RDW: 14.5 % (ref 11.6–15.4)
WBC: 4.2 10*3/uL (ref 3.4–10.8)

## 2018-09-17 LAB — CMP14+EGFR
ALT: 13 IU/L (ref 0–44)
AST: 13 IU/L (ref 0–40)
Albumin/Globulin Ratio: 1.2 (ref 1.2–2.2)
Albumin: 3 g/dL — ABNORMAL LOW (ref 3.6–4.6)
Alkaline Phosphatase: 74 IU/L (ref 39–117)
BUN/Creatinine Ratio: 12 (ref 10–24)
BUN: 12 mg/dL (ref 8–27)
Bilirubin Total: 0.6 mg/dL (ref 0.0–1.2)
CO2: 27 mmol/L (ref 20–29)
Calcium: 7.7 mg/dL — ABNORMAL LOW (ref 8.6–10.2)
Chloride: 98 mmol/L (ref 96–106)
Creatinine, Ser: 1 mg/dL (ref 0.76–1.27)
GFR calc Af Amer: 79 mL/min/{1.73_m2} (ref >59)
GFR calc non Af Amer: 68 mL/min/{1.73_m2} (ref >59)
Globulin, Total: 2.5 g/dL (ref 1.5–4.5)
Glucose: 181 mg/dL — ABNORMAL HIGH (ref 65–99)
Potassium: 3.6 mmol/L (ref 3.5–5.2)
Sodium: 137 mmol/L (ref 134–144)
Total Protein: 5.5 g/dL — ABNORMAL LOW (ref 6.0–8.5)

## 2018-09-17 LAB — LIPID PANEL
Chol/HDL Ratio: 3.3 ratio (ref 0.0–5.0)
Cholesterol, Total: 170 mg/dL (ref 100–199)
HDL: 51 mg/dL (ref >39)
LDL Calculated: 106 mg/dL — ABNORMAL HIGH (ref 0–99)
Triglycerides: 65 mg/dL (ref 0–149)
VLDL Cholesterol Cal: 13 mg/dL (ref 5–40)

## 2018-09-21 ENCOUNTER — Encounter: Payer: Self-pay | Admitting: Physician Assistant

## 2018-09-21 ENCOUNTER — Other Ambulatory Visit: Payer: Self-pay | Admitting: Physician Assistant

## 2018-09-21 DIAGNOSIS — I10 Essential (primary) hypertension: Secondary | ICD-10-CM

## 2018-09-21 DIAGNOSIS — D696 Thrombocytopenia, unspecified: Secondary | ICD-10-CM

## 2018-09-21 NOTE — Progress Notes (Signed)
 BP (!) 159/58   Pulse 63   Temp 98.7 F (37.1 C) (Temporal)   Ht 5' 6" (1.676 m)   Wt 148 lb 12.8 oz (67.5 kg)   SpO2 97%   BMI 24.02 kg/m    Subjective:    Patient ID: Chad [name], male    DOB: 06/30/1931, 85 y.o.   MRN: [id number]  HPI: Chad [name] is a 85 y.o. male presenting on 09/16/2018 for Edema (right leg) and Abdominal Pain  This patient comes in for recheck on labs.  He would like to have them performed before he comes back in to see Dr. Dettinger.  Today he has been having a little bit of edema and abdominal pain.  Years ago he had a history of decreased iron also he had had a C. difficile infection in the past.  This is nothing like that at this time.  The right leg is greater than left.  In looking at his leg he has had vein grafting performed in that leg.  We reviewed his medications.  We will have him increase his furosemide for just 3 or 4 days.  Checking his weight daily.  And having a short follow-up.  Past Medical History:  Diagnosis Date  . 1st degree AV block   . Atrial fibrillation (HCC)   . CAD (coronary artery disease)    a. s/p CABG in 1980's (patient unsure of exact year or grafts)  . Clostridium difficile colitis 08/14/2018   s/p Vanc 125mg qid x 10 days with resolution  . Diabetes mellitus without complication (HCC)   . Gout   . Hyperlipidemia   . Hypertension   . Neuropathy   . Stroke (HCC)    Relevant past medical, surgical, family and social history reviewed and updated as indicated. Interim medical history since our last visit reviewed. Allergies and medications reviewed and updated. DATA REVIEWED: CHART IN EPIC  Family History reviewed for pertinent findings.  Review of Systems  Constitutional: Negative.  Negative for appetite change and fatigue.  Eyes: Negative for pain and visual disturbance.  Respiratory: Negative.  Negative for cough, chest tightness, shortness of breath and wheezing.   Cardiovascular: Positive for leg  swelling. Negative for chest pain and palpitations.  Gastrointestinal: Negative.  Negative for abdominal pain, diarrhea, nausea and vomiting.  Genitourinary: Negative.   Skin: Negative.  Negative for color change and rash.  Neurological: Negative.  Negative for weakness, numbness and headaches.  Psychiatric/Behavioral: Negative.     Allergies as of 09/16/2018   No Known Allergies     Medication List       Accurate as of September 16, 2018 11:59 PM. If you have any questions, ask your nurse or doctor.        allopurinol 100 MG tablet Commonly known as: ZYLOPRIM Take 100 mg by mouth daily.   atorvastatin 20 MG tablet Commonly known as: LIPITOR TAKE 1 TABLET BY MOUTH ONCE DAILY AT 6 PM (Needs to be seen before next refill)   Eliquis 5 MG Tabs tablet Generic drug: apixaban TAKE 1 TABLET BY MOUTH TWICE DAILY . 6 MONTH APPOINTMENT REQUIRED FOR FUTURE REFILLS   fish oil-omega-3 fatty acids 1000 MG capsule Take 1 g by mouth daily.   furosemide 40 MG tablet Commonly known as: LASIX Take 1 tablet (40 mg total) by mouth daily.   glucose blood test strip Commonly known as: ONE TOUCH ULTRA TEST USE TO CHECK GLUCOSE THREE TIMES DAILY   Lantus SoloStar 100   UNIT/ML Solostar Pen Generic drug: Insulin Glargine Inject 5 Units into the skin daily. What changed: See the new instructions. Changed by: Terald Sleeper, PA-C   onetouch ultrasoft lancets Test TId. DX E11.9   Pen Needles 32G X 4 MM Misc 1 each by Does not apply route daily. Use with pen to inject insulin QD (Dx: E10.65, E10.8 - IDDM, uncontrolled)   PROBIOTIC DAILY PO Take 1 capsule by mouth daily.   Vitamin D3 50 MCG (2000 UT) Tabs Take 2,000 Units by mouth daily.          Objective:    BP (!) 159/58   Pulse 63   Temp 98.7 F (37.1 C) (Temporal)   Ht 5' 6" (1.676 m)   Wt 148 lb 12.8 oz (67.5 kg)   SpO2 97%   BMI 24.02 kg/m   No Known Allergies  Wt Readings from Last 3 Encounters:  09/16/18 148 lb 12.8 oz  (67.5 kg)  06/20/18 144 lb (65.3 kg)  02/06/18 147 lb (66.7 kg)    Physical Exam Vitals signs and nursing note reviewed.  Constitutional:      General: He is not in acute distress.    Appearance: He is well-developed.  HENT:     Head: Normocephalic and atraumatic.  Eyes:     Conjunctiva/sclera: Conjunctivae normal.     Pupils: Pupils are equal, round, and reactive to light.  Cardiovascular:     Rate and Rhythm: Normal rate and regular rhythm.     Heart sounds: Normal heart sounds.  Pulmonary:     Effort: Pulmonary effort is normal. No respiratory distress.     Breath sounds: Normal breath sounds.  Musculoskeletal:     Right lower leg: 2+ Edema present.     Left lower leg: 1+ Edema present.  Skin:    General: Skin is warm and dry.  Psychiatric:        Behavior: Behavior normal.     Results for orders placed or performed in visit on 09/16/18  CMP14+EGFR  Result Value Ref Range   Glucose 181 (H) 65 - 99 mg/dL   BUN 12 8 - 27 mg/dL   Creatinine, Ser 1.00 0.76 - 1.27 mg/dL   GFR calc non Af Amer 68 >59 mL/min/1.73   GFR calc Af Amer 79 >59 mL/min/1.73   BUN/Creatinine Ratio 12 10 - 24   Sodium 137 134 - 144 mmol/L   Potassium 3.6 3.5 - 5.2 mmol/L   Chloride 98 96 - 106 mmol/L   CO2 27 20 - 29 mmol/L   Calcium 7.7 (L) 8.6 - 10.2 mg/dL   Total Protein 5.5 (L) 6.0 - 8.5 g/dL   Albumin 3.0 (L) 3.6 - 4.6 g/dL   Globulin, Total 2.5 1.5 - 4.5 g/dL   Albumin/Globulin Ratio 1.2 1.2 - 2.2   Bilirubin Total 0.6 0.0 - 1.2 mg/dL   Alkaline Phosphatase 74 39 - 117 IU/L   AST 13 0 - 40 IU/L   ALT 13 0 - 44 IU/L  Lipid panel  Result Value Ref Range   Cholesterol, Total 170 100 - 199 mg/dL   Triglycerides 65 0 - 149 mg/dL   HDL 51 >39 mg/dL   VLDL Cholesterol Cal 13 5 - 40 mg/dL   LDL Calculated 106 (H) 0 - 99 mg/dL   Chol/HDL Ratio 3.3 0.0 - 5.0 ratio  CBC with Differential/Platelet  Result Value Ref Range   WBC 4.2 3.4 - 10.8 x10E3/uL   RBC 3.08 (L) 4.14 -  5.80 x10E6/uL    Hemoglobin 10.2 (L) 13.0 - 17.7 g/dL   Hematocrit 30.0 (L) 37.5 - 51.0 %   MCV 97 79 - 97 fL   MCH 33.1 (H) 26.6 - 33.0 pg   MCHC 34.0 31.5 - 35.7 g/dL   RDW 14.5 11.6 - 15.4 %   Platelets 163 150 - 450 x10E3/uL   Neutrophils 65 Not Estab. %   Lymphs 21 Not Estab. %   Monocytes 13 Not Estab. %   Eos 0 Not Estab. %   Basos 0 Not Estab. %   Neutrophils Absolute 2.7 1.4 - 7.0 x10E3/uL   Lymphocytes Absolute 0.9 0.7 - 3.1 x10E3/uL   Monocytes Absolute 0.6 0.1 - 0.9 x10E3/uL   EOS (ABSOLUTE) 0.0 0.0 - 0.4 x10E3/uL   Basophils Absolute 0.0 0.0 - 0.2 x10E3/uL   Immature Granulocytes 1 Not Estab. %   Immature Grans (Abs) 0.0 0.0 - 0.1 x10E3/uL  Bayer DCA Hb A1c Waived  Result Value Ref Range   HB A1C (BAYER DCA - WAIVED) 7.9 (H) <7.0 %      Assessment & Plan:   1. Diabetes mellitus, insulin dependent (IDDM), uncontrolled (HCC) - Insulin Glargine (LANTUS SOLOSTAR) 100 UNIT/ML Solostar Pen; Inject 5 Units into the skin daily.  Dispense: 30 mL; Refill: 0 - CBC with Differential/Platelet - Bayer DCA Hb A1c Waived  2. Essential hypertension - CMP14+EGFR - Lipid panel - CBC with Differential/Platelet - Bayer DCA Hb A1c Waived  3. Congestive heart failure, unspecified HF chronicity, unspecified heart failure type (HCC) - CMP14+EGFR - Lipid panel - CBC with Differential/Platelet - Bayer DCA Hb A1c Waived   Continue all other maintenance medications as listed above.  Follow up plan: Return in about 2 weeks (around 09/30/2018) for PCP Dettinger in 2 weeks for recheck on all, need to come in.  Educational handout given for survey  Angel S. Jones PA-C [facility] [address]  [place], [place] [address] [phone]   09/21/2018, 4:54 PM  

## 2018-09-22 ENCOUNTER — Telehealth: Payer: Self-pay | Admitting: *Deleted

## 2018-09-22 ENCOUNTER — Other Ambulatory Visit: Payer: Self-pay | Admitting: *Deleted

## 2018-09-22 DIAGNOSIS — D696 Thrombocytopenia, unspecified: Secondary | ICD-10-CM

## 2018-09-22 DIAGNOSIS — I10 Essential (primary) hypertension: Secondary | ICD-10-CM

## 2018-09-22 NOTE — Telephone Encounter (Signed)
Unable to contact patient due to no answer on phone.  New lab work ordered under pcp.

## 2018-09-26 ENCOUNTER — Encounter: Payer: Self-pay | Admitting: *Deleted

## 2018-09-30 ENCOUNTER — Telehealth: Payer: Self-pay | Admitting: Internal Medicine

## 2018-09-30 DIAGNOSIS — A498 Other bacterial infections of unspecified site: Secondary | ICD-10-CM

## 2018-09-30 DIAGNOSIS — R197 Diarrhea, unspecified: Secondary | ICD-10-CM

## 2018-09-30 NOTE — Telephone Encounter (Signed)
Spoke with pts daughter. Pt finished Vancomycin 2 weeks ago and is still having watery stool. Pt is having diarrhea at night when he lays down. Pt eats about 2-3 hours prior to laying down. No abdominal pain but pressure is felt by pt. Pt went a total of 12 times at night recently. When in bed pt is slight elevated. Pt hasn't changed any food that he's eating. Pt isn't taking any diarrhea medications nor otc gas medications.

## 2018-09-30 NOTE — Telephone Encounter (Signed)
Pt's daughter, Lenna Sciara, called to say that patient had seen EG on 8/21 for cdiff and he has completed his antibiotics, but he is still having problems. Please advise and call [name] at [phone]

## 2018-10-01 ENCOUNTER — Other Ambulatory Visit: Payer: Self-pay | Admitting: Family Medicine

## 2018-10-01 NOTE — Telephone Encounter (Signed)
Is he currently taking probiotics? If not, have him start them for about 1-2 months. It's not usually indicated to retest so soon after treating because you can get a false positive (due to spores, but no active disease).  How many stools is he having a day?

## 2018-10-01 NOTE — Telephone Encounter (Signed)
Spoke with pts daughter. Pt takes the generic Align probiotic and has taken it for many years daily. The amount of times pt has diarrhea during the day varies. Yesterday, pt was very active and went only 3 times. The previous day, pt had 12 bowel movements. Pts daughter is going to see how pt does over the next 1-2 weeks and contact our office back if needed.

## 2018-10-02 ENCOUNTER — Ambulatory Visit: Payer: Medicare Other | Admitting: Family

## 2018-10-02 NOTE — Telephone Encounter (Signed)
Noted. Pt's daughter is aware.

## 2018-10-02 NOTE — Telephone Encounter (Signed)
Noted. Call us with any problems or concerns or if he gets worse. Drink plenty of fluids to stay hydrated.

## 2018-10-06 ENCOUNTER — Telehealth: Payer: Self-pay | Admitting: *Deleted

## 2018-10-06 NOTE — Telephone Encounter (Signed)
Daughter Lenna Sciara) called and said that her dad is no better.  Diarrhea seems worse.  Can we call to discuss?  [phone]

## 2018-10-06 NOTE — Telephone Encounter (Signed)
Pt's daughter called. Pt continues to have they watery diarrhea. Pt is taking a probiotic daily and had recent C Diff testing. Pt gets a pain in his stomach and has to have a bowel movement immediately. Pt feels a lot of pressure in his abdomen. Pt went to the bathroom 12 times yesterday 10/05/2018. Pt's daughter states it doesn't matter if pt has eaten or not,  Diarrhea comes.Today has been a better day for pt. Pt is staying hydrated and doesn't want to eat much.

## 2018-10-07 NOTE — Telephone Encounter (Signed)
Pt's daughter called back. Pt had a rough night. Frequent watery diarrhea all night last night 10/06/2018. Pt isn't taking any antidiarrhea medications. Pt says something has to be done. Pt's daughter feels the Cdiff hasn't been cleared up. Pt is eating soups and chicken. Pt isn't eating greasy foods. The diarrhea is coming daily or might skip a day.

## 2018-10-08 ENCOUNTER — Other Ambulatory Visit: Payer: Self-pay | Admitting: Family Medicine

## 2018-10-08 NOTE — Addendum Note (Signed)
Addended by: Gordy Levan, ERIC A on: 10/08/2018 04:42 PM   Modules accepted: Orders

## 2018-10-08 NOTE — Telephone Encounter (Signed)
I put in to recheck CDiff PCR and toxins. This will let us know if he is having a recurrence or treatment failure. Have stools tests done ASAP. I will also put in a GI path panel. We will call with results.  Encourage fluids, clear liquid diet for now and advance diet as tolerated (clear liquids to full liquids to soft/bland diet to regular diet)  If symptoms become severe, unable to keep down food/fluids, significant weakness they can go to the ER.

## 2018-10-08 NOTE — Telephone Encounter (Signed)
Pt will complete stool testing.

## 2018-10-08 NOTE — Telephone Encounter (Signed)
Noted. Pt's daughter notified of EGs recommendations.

## 2018-10-13 DIAGNOSIS — R197 Diarrhea, unspecified: Secondary | ICD-10-CM | POA: Diagnosis not present

## 2018-10-15 LAB — GASTROINTESTINAL PATHOGEN PANEL PCR
C. difficile Tox A/B, PCR: DETECTED — AB
Campylobacter, PCR: NOT DETECTED
Cryptosporidium, PCR: NOT DETECTED
E coli (ETEC) LT/ST PCR: NOT DETECTED
E coli (STEC) stx1/stx2, PCR: NOT DETECTED
E coli 0157, PCR: NOT DETECTED
Giardia lamblia, PCR: NOT DETECTED
Norovirus, PCR: NOT DETECTED
Rotavirus A, PCR: NOT DETECTED
Salmonella, PCR: NOT DETECTED
Shigella, PCR: NOT DETECTED

## 2018-10-15 LAB — C. DIFFICILE GDH AND TOXIN A/B
GDH ANTIGEN: NOT DETECTED
MICRO NUMBER:: 903298
SPECIMEN QUALITY:: ADEQUATE
TOXIN A AND B: DETECTED

## 2018-10-15 LAB — CLOSTRIDIUM DIFFICILE TOXIN B, QUALITATIVE, REAL-TIME PCR: Toxigenic C. Difficile by PCR: DETECTED — AB

## 2018-10-16 NOTE — Telephone Encounter (Signed)
Received a call from [facility], Dificid medication to treat C diff has been approved under pt's insurance for 1 year. See positive for C diff result note for more details.

## 2018-10-20 ENCOUNTER — Telehealth: Payer: Self-pay | Admitting: Family Medicine

## 2018-10-20 NOTE — Telephone Encounter (Signed)
Patient willing to start coumadin route.  Televisit appt made.

## 2018-10-20 NOTE — Telephone Encounter (Signed)
There are really only a couple options at this point, there is another medication that is out there the cold Xarelto but is probably going to cause just as much as the Eliquis.  You could give him the information for the number to call Elsa to apply for prescription assistance which could make it affordable for him or he could be switched to Coumadin which is cheap but you have to come into for regular lab values and monitor, if he decides that he wants to make the switch then he will need to schedule that during an office visit to discuss in person or over the phone these options.

## 2018-10-20 NOTE — Telephone Encounter (Signed)
[name] aware and verbalizes understanding - states that she will discuss with patient and let us know.

## 2018-10-24 ENCOUNTER — Encounter: Payer: Self-pay | Admitting: Family Medicine

## 2018-10-24 ENCOUNTER — Ambulatory Visit (INDEPENDENT_AMBULATORY_CARE_PROVIDER_SITE_OTHER): Payer: Medicare Other | Admitting: Family Medicine

## 2018-10-24 DIAGNOSIS — I482 Chronic atrial fibrillation, unspecified: Secondary | ICD-10-CM

## 2018-10-24 MED ORDER — APIXABAN 5 MG PO TABS: 5.0000 mg | ORAL_TABLET | Freq: Two times a day (BID) | ORAL | 3 refills | Status: DC

## 2018-10-24 NOTE — Progress Notes (Signed)
Virtual Visit via telephone Note  I connected with Chad [name] on 10/24/18 at [phone] by telephone and verified that I am speaking with the correct person using two identifiers. Chad [name] is currently located at home and daughter Chad [name]  are currently with her during visit. The provider, Elige Radon Dettinger, MD is located in their office at time of visit.   Call ended at [phone]  I discussed the limitations, risks, security and privacy concerns of performing an evaluation and management service by telephone and the availability of in person appointments. I also discussed with the patient that there may be a patient responsible charge related to this service. The patient expressed understanding and agreed to proceed.   History and Present Illness: Patient is calling in for eliquis and says that is not affordable and he is currently taking eliquis and afib is the diagnosis.  She says he has not had any problems with Eliquis except for the expense and he actually likes the medication but is willing to go on Coumadin if needed.  No diagnosis found.  Outpatient Encounter Medications as of 10/24/2018  Medication Sig  . allopurinol (ZYLOPRIM) 100 MG tablet Take 100 mg by mouth daily.  Marland Kitchen apixaban (ELIQUIS) 5 MG TABS tablet Take 1 tablet (5 mg total) by mouth 2 (two) times daily.  Marland Kitchen atorvastatin (LIPITOR) 20 MG tablet TAKE 1 TABLET BY MOUTH ONCE DAILY AT 6 PM (Needs to be seen before next refill)  . Cholecalciferol (VITAMIN D3) 2000 units TABS Take 2,000 Units by mouth daily.  . fish oil-omega-3 fatty acids 1000 MG capsule Take 1 g by mouth daily.  . furosemide (LASIX) 40 MG tablet Take 1 tablet by mouth once daily  . glucose blood (ONE TOUCH ULTRA TEST) test strip USE TO CHECK GLUCOSE THREE TIMES DAILY  . Insulin Glargine (LANTUS SOLOSTAR) 100 UNIT/ML Solostar Pen Inject 5 Units into the skin daily.  . Insulin Pen Needle (PEN NEEDLES) 32G X 4 MM MISC 1 each by Does not apply route  daily. Use with pen to inject insulin QD (Dx: E10.65, E10.8 - IDDM, uncontrolled)  . Lancets (ONETOUCH ULTRASOFT) lancets Test TId. DX E11.9  . Probiotic Product (PROBIOTIC DAILY PO) Take 1 capsule by mouth daily.   No facility-administered encounter medications on file as of 10/24/2018.     Review of Systems  Constitutional: Negative for chills and fever.  Eyes: Negative for visual disturbance.  Respiratory: Negative for shortness of breath and wheezing.   Cardiovascular: Negative for chest pain, palpitations and leg swelling.  Musculoskeletal: Negative for back pain and gait problem.  Skin: Negative for rash.  Neurological: Negative for dizziness, weakness and light-headedness.  All other systems reviewed and are negative.   Observations/Objective: patient sounds comfortable and no acute distress  Assessment and Plan: Problem List Items Addressed This Visit      Cardiovascular and Mediastinum   Atrial fibrillation, chronic (HCC) - Primary   Relevant Medications   apixaban (ELIQUIS) 5 MG TABS tablet       Follow Up Instructions:  Gave the number for the prescription assistance program will try and keep him on Eliquis and also help get him coverage for the donut hole for Lantus as well, she will call their first and I sent a prescription of the Eliquis for him and if he cannot do that if we can get that worked out that we will try Coumadin in the future.  We will follow-up as soon as as  needed, if we cannot get the Eliquis and need to go on Coumadin then he will follow-up ASAP   I discussed the assessment and treatment plan with the patient. The patient was provided an opportunity to ask questions and all were answered. The patient agreed with the plan and demonstrated an understanding of the instructions.   The patient was advised to call back or seek an in-person evaluation if the symptoms worsen or if the condition fails to improve as anticipated.  The above assessment and  management plan was discussed with the patient. The patient verbalized understanding of and has agreed to the management plan. Patient is aware to call the clinic if symptoms persist or worsen. Patient is aware when to return to the clinic for a follow-up visit. Patient educated on when it is appropriate to go to the emergency department.    I provided 13 minutes of non-face-to-face time during this encounter.    Worthy Rancher, MD

## 2018-11-12 ENCOUNTER — Telehealth: Payer: Self-pay | Admitting: Family Medicine

## 2018-11-13 MED ORDER — PEN NEEDLES 32G X 4 MM MISC: 1.0000 | Freq: Four times a day (QID) | 3 refills | Status: AC

## 2018-11-13 NOTE — Telephone Encounter (Signed)
Pen needles sent to [facility].  

## 2018-12-22 ENCOUNTER — Encounter: Payer: Self-pay | Admitting: Family Medicine

## 2018-12-22 ENCOUNTER — Ambulatory Visit (INDEPENDENT_AMBULATORY_CARE_PROVIDER_SITE_OTHER): Payer: Medicare Other | Admitting: Family Medicine

## 2018-12-22 ENCOUNTER — Other Ambulatory Visit: Payer: Self-pay

## 2018-12-22 VITALS — BP 180/60 | HR 65 | Temp 97.0°F | Ht 66.0 in | Wt 143.2 lb

## 2018-12-22 DIAGNOSIS — E1169 Type 2 diabetes mellitus with other specified complication: Secondary | ICD-10-CM | POA: Diagnosis not present

## 2018-12-22 DIAGNOSIS — N400 Enlarged prostate without lower urinary tract symptoms: Secondary | ICD-10-CM | POA: Diagnosis not present

## 2018-12-22 DIAGNOSIS — M109 Gout, unspecified: Secondary | ICD-10-CM | POA: Diagnosis not present

## 2018-12-22 LAB — MICROSCOPIC EXAMINATION
Bacteria, UA: NONE SEEN
Epithelial Cells (non renal): NONE SEEN /hpf (ref 0–10)
Renal Epithel, UA: NONE SEEN /hpf
WBC, UA: NONE SEEN /hpf (ref 0–5)

## 2018-12-22 LAB — URINALYSIS, COMPLETE
Bilirubin, UA: NEGATIVE
Glucose, UA: NEGATIVE
Ketones, UA: NEGATIVE
Leukocytes,UA: NEGATIVE
Nitrite, UA: NEGATIVE
Specific Gravity, UA: 1.02 (ref 1.005–1.030)
Urobilinogen, Ur: 0.2 mg/dL (ref 0.2–1.0)
pH, UA: 5 (ref 5.0–7.5)

## 2018-12-22 LAB — BAYER DCA HB A1C WAIVED: HB A1C (BAYER DCA - WAIVED): 7.1 % — ABNORMAL HIGH (ref <7.0)

## 2018-12-22 MED ORDER — ALLOPURINOL 100 MG PO TABS: 100.0000 mg | ORAL_TABLET | Freq: Every day | ORAL | 3 refills | Status: DC

## 2018-12-22 NOTE — Progress Notes (Signed)
BP (!) 180/60   Pulse 65   Temp (!) 97 F (36.1 C) (Temporal)   Ht 5' 6"  (1.676 m)   Wt 143 lb 3.2 oz (65 kg)   SpO2 97%   BMI 23.11 kg/m    Subjective:   Patient ID: Chad [name], male    DOB: Sep 24, 1931, 85 y.o.   MRN: [id number]  HPI: Chad [name] is a 85 y.o. male presenting on 12/22/2018 for Referral (was going to Dr. Carmelina Noun and would like a new referral )   HPI Patient has a history of BPH and a possible history of gout, he is currently taking allopurinol.  I could not find in his records.  It looks like he had been seeing Dr. Jeffie Pollock for this and has been taking it for some time and just sees Dr. Jeffie Pollock once a year for this.  He would like to continue on the medication although he cannot recall exactly why he was started on it and his daughter who is here with him to help with the memory stuff said it was started before her time that she has been managing things and does not know exactly why it was either but they would like a referral to a new urologist because they have been having trouble getting a hold of Dr. Ralene Muskrat office and getting an appointment with him needing a refill.  Type 2 diabetes mellitus Patient comes in today for recheck of his diabetes. Patient has been currently taking Lantus. Patient is not currently on an ACE inhibitor/ARB. Patient has not seen an ophthalmologist this year. Patient denies any issues with their feet.   BPH and gout Patient has been seeing a urologist Dr. Jeffie Pollock for BPH and gout and is on allopurinol.  He denies any difficulty urinating or difficulties with prostate but just that he would go and see Dr. Jeffie Pollock for his yearly's and then get the refill for allopurinol.  He cannot even recall from 100% why he is taking the allopurinol and I am assuming for gout.  He has not had any flares or anything recently.  We will check some blood work and see what this is.  Cannot see Dr. Ralene Muskrat notes  Relevant past medical, surgical, family and social  history reviewed and updated as indicated. Interim medical history since our last visit reviewed. Allergies and medications reviewed and updated.  Review of Systems  Constitutional: Negative for chills and fever.  Eyes: Negative for discharge.  Respiratory: Negative for shortness of breath and wheezing.   Cardiovascular: Negative for chest pain and leg swelling.  Musculoskeletal: Negative for arthralgias, back pain and gait problem.  Skin: Negative for rash.  Neurological: Negative for dizziness, weakness and light-headedness.  All other systems reviewed and are negative.   Per HPI unless specifically indicated above   Allergies as of 12/22/2018   No Known Allergies     Medication List       Accurate as of December 22, 2018 11:59 PM. If you have any questions, ask your nurse or doctor.        allopurinol 100 MG tablet Commonly known as: ZYLOPRIM Take 1 tablet (100 mg total) by mouth daily.   apixaban 5 MG Tabs tablet Commonly known as: Eliquis Take 1 tablet (5 mg total) by mouth 2 (two) times daily.   aspirin EC 81 MG tablet Take 81 mg by mouth daily.   atorvastatin 20 MG tablet Commonly known as: LIPITOR TAKE 1 TABLET BY MOUTH ONCE DAILY  AT 6 PM (Needs to be seen before next refill)   fish oil-omega-3 fatty acids 1000 MG capsule Take 1 g by mouth daily.   furosemide 40 MG tablet Commonly known as: LASIX Take 1 tablet by mouth once daily   glucose blood test strip Commonly known as: ONE TOUCH ULTRA TEST USE TO CHECK GLUCOSE THREE TIMES DAILY   Lantus SoloStar 100 UNIT/ML Solostar Pen Generic drug: Insulin Glargine Inject 5 Units into the skin daily.   onetouch ultrasoft lancets Test TId. DX E11.9   Pen Needles 32G X 4 MM Misc 1 each by Does not apply route 4 (four) times daily. Dx: E11.65   PROBIOTIC DAILY PO Take 1 capsule by mouth daily.   Vitamin D3 50 MCG (2000 UT) Tabs Take 2,000 Units by mouth daily.        Objective:   BP (!) 180/60    Pulse 65   Temp (!) 97 F (36.1 C) (Temporal)   Ht 5' 6"  (1.676 m)   Wt 143 lb 3.2 oz (65 kg)   SpO2 97%   BMI 23.11 kg/m   Wt Readings from Last 3 Encounters:  12/22/18 143 lb 3.2 oz (65 kg)  09/16/18 148 lb 12.8 oz (67.5 kg)  06/20/18 144 lb (65.3 kg)    Physical Exam Vitals signs and nursing note reviewed.  Constitutional:      General: He is not in acute distress.    Appearance: He is well-developed. He is not diaphoretic.  Eyes:     General: No scleral icterus.    Conjunctiva/sclera: Conjunctivae normal.  Neck:     Musculoskeletal: Neck supple.     Thyroid: No thyromegaly.  Cardiovascular:     Rate and Rhythm: Normal rate and regular rhythm.     Heart sounds: Normal heart sounds. No murmur.  Pulmonary:     Effort: Pulmonary effort is normal. No respiratory distress.     Breath sounds: Normal breath sounds. No wheezing.  Musculoskeletal: Normal range of motion.  Lymphadenopathy:     Cervical: No cervical adenopathy.  Skin:    General: Skin is warm and dry.     Findings: No rash.  Neurological:     Mental Status: He is alert and oriented to person, place, and time.     Coordination: Coordination normal.     Comments: Patient does have some mild memory impairment  Psychiatric:        Behavior: Behavior normal.       Assessment & Plan:   Problem List Items Addressed This Visit      Endocrine   Type 2 diabetes mellitus with other specified complication ([place])   Relevant Medications   aspirin EC 81 MG tablet   Other Relevant Orders   CBC with Differential/Platelet (Completed)   CMP14+EGFR (Completed)   hgba1c (Completed)     Genitourinary   BPH (benign prostatic hyperplasia) - Primary   Relevant Orders   Ambulatory referral to Urology   PSA, total and free (Completed)    Other Visit Diagnoses    Gout, unspecified cause, unspecified chronicity, unspecified site       Relevant Medications   allopurinol (ZYLOPRIM) 100 MG tablet   Other Relevant  Orders   urinalysis- dip and micro (Completed)   Uric acid (Completed)   Ambulatory referral to Urology      We did a new referral to urology, will do urinalysis and check blood work  No changes in current medication. Follow up plan: Return in  about 3 months (around 03/22/2019), or if symptoms worsen or fail to improve, for Diabetes recheck.  Counseling provided for all of the vaccine components Orders Placed This Encounter  Procedures  . Microscopic Examination  . CBC with Differential/Platelet  . CMP14+EGFR  . urinalysis- dip and micro  . Uric acid  . hgba1c  . PSA, total and free  . Ambulatory referral to Urology    Caryl Pina, MD [facility] 12/24/2018, 7:58 AM

## 2018-12-23 LAB — CBC WITH DIFFERENTIAL/PLATELET
Basophils Absolute: 0 10*3/uL (ref 0.0–0.2)
Basos: 1 %
EOS (ABSOLUTE): 0 10*3/uL (ref 0.0–0.4)
Eos: 0 %
Hematocrit: 37.5 % (ref 37.5–51.0)
Hemoglobin: 12.4 g/dL — ABNORMAL LOW (ref 13.0–17.7)
Immature Grans (Abs): 0.1 10*3/uL (ref 0.0–0.1)
Immature Granulocytes: 1 %
Lymphocytes Absolute: 2 10*3/uL (ref 0.7–3.1)
Lymphs: 31 %
MCH: 33.2 pg — ABNORMAL HIGH (ref 26.6–33.0)
MCHC: 33.1 g/dL (ref 31.5–35.7)
MCV: 101 fL — ABNORMAL HIGH (ref 79–97)
Monocytes Absolute: 0.6 10*3/uL (ref 0.1–0.9)
Monocytes: 9 %
Neutrophils Absolute: 3.8 10*3/uL (ref 1.4–7.0)
Neutrophils: 58 %
Platelets: 123 10*3/uL — ABNORMAL LOW (ref 150–450)
RBC: 3.73 x10E6/uL — ABNORMAL LOW (ref 4.14–5.80)
RDW: 15.7 % — ABNORMAL HIGH (ref 11.6–15.4)
WBC: 6.6 10*3/uL (ref 3.4–10.8)

## 2018-12-23 LAB — CMP14+EGFR
ALT: 7 IU/L (ref 0–44)
AST: 11 IU/L (ref 0–40)
Albumin/Globulin Ratio: 1.5 (ref 1.2–2.2)
Albumin: 3.7 g/dL (ref 3.6–4.6)
Alkaline Phosphatase: 85 IU/L (ref 39–117)
BUN/Creatinine Ratio: 13 (ref 10–24)
BUN: 16 mg/dL (ref 8–27)
Bilirubin Total: 0.5 mg/dL (ref 0.0–1.2)
CO2: 27 mmol/L (ref 20–29)
Calcium: 8.6 mg/dL (ref 8.6–10.2)
Chloride: 102 mmol/L (ref 96–106)
Creatinine, Ser: 1.22 mg/dL (ref 0.76–1.27)
GFR calc Af Amer: 62 mL/min/{1.73_m2} (ref >59)
GFR calc non Af Amer: 54 mL/min/{1.73_m2} — ABNORMAL LOW (ref >59)
Globulin, Total: 2.5 g/dL (ref 1.5–4.5)
Glucose: 134 mg/dL — ABNORMAL HIGH (ref 65–99)
Potassium: 4.3 mmol/L (ref 3.5–5.2)
Sodium: 140 mmol/L (ref 134–144)
Total Protein: 6.2 g/dL (ref 6.0–8.5)

## 2018-12-23 LAB — URIC ACID: Uric Acid: 6.1 mg/dL (ref 3.7–8.6)

## 2018-12-23 LAB — PSA, TOTAL AND FREE
PSA, Free Pct: 33.3 %
PSA, Free: 0.4 ng/mL
Prostate Specific Ag, Serum: 1.2 ng/mL (ref 0.0–4.0)

## 2018-12-24 ENCOUNTER — Ambulatory Visit: Payer: Medicare Other | Admitting: Nurse Practitioner

## 2018-12-24 ENCOUNTER — Other Ambulatory Visit: Payer: Self-pay | Admitting: Family Medicine

## 2018-12-31 ENCOUNTER — Other Ambulatory Visit: Payer: Self-pay | Admitting: Family Medicine

## 2019-03-25 ENCOUNTER — Other Ambulatory Visit: Payer: Self-pay | Admitting: Family Medicine

## 2019-03-25 ENCOUNTER — Other Ambulatory Visit: Payer: Self-pay

## 2019-03-26 ENCOUNTER — Ambulatory Visit (INDEPENDENT_AMBULATORY_CARE_PROVIDER_SITE_OTHER): Payer: Medicare Other | Admitting: Family Medicine

## 2019-03-26 ENCOUNTER — Encounter: Payer: Self-pay | Admitting: Family Medicine

## 2019-03-26 VITALS — BP 163/57 | HR 59 | Temp 97.3°F | Ht 66.0 in | Wt 143.0 lb

## 2019-03-26 DIAGNOSIS — E785 Hyperlipidemia, unspecified: Secondary | ICD-10-CM | POA: Diagnosis not present

## 2019-03-26 DIAGNOSIS — I10 Essential (primary) hypertension: Secondary | ICD-10-CM | POA: Diagnosis not present

## 2019-03-26 DIAGNOSIS — N400 Enlarged prostate without lower urinary tract symptoms: Secondary | ICD-10-CM | POA: Diagnosis not present

## 2019-03-26 DIAGNOSIS — Z794 Long term (current) use of insulin: Secondary | ICD-10-CM

## 2019-03-26 DIAGNOSIS — M109 Gout, unspecified: Secondary | ICD-10-CM

## 2019-03-26 DIAGNOSIS — E1169 Type 2 diabetes mellitus with other specified complication: Secondary | ICD-10-CM

## 2019-03-26 DIAGNOSIS — I482 Chronic atrial fibrillation, unspecified: Secondary | ICD-10-CM

## 2019-03-26 LAB — BAYER DCA HB A1C WAIVED: HB A1C (BAYER DCA - WAIVED): 8 % — ABNORMAL HIGH (ref <7.0)

## 2019-03-26 MED ORDER — FUROSEMIDE 40 MG PO TABS: 40.0000 mg | ORAL_TABLET | Freq: Every day | ORAL | 3 refills | Status: DC

## 2019-03-26 MED ORDER — LANTUS SOLOSTAR 100 UNIT/ML ~~LOC~~ SOPN: 5.0000 [IU] | PEN_INJECTOR | Freq: Every day | SUBCUTANEOUS | 3 refills | Status: DC

## 2019-03-26 MED ORDER — ALLOPURINOL 100 MG PO TABS: 100.0000 mg | ORAL_TABLET | Freq: Every day | ORAL | 3 refills | Status: DC

## 2019-03-26 MED ORDER — ATORVASTATIN CALCIUM 20 MG PO TABS: ORAL_TABLET | ORAL | 3 refills | Status: DC

## 2019-03-26 MED ORDER — APIXABAN 5 MG PO TABS: 5.0000 mg | ORAL_TABLET | Freq: Two times a day (BID) | ORAL | 3 refills | Status: DC

## 2019-03-26 NOTE — Progress Notes (Signed)
BP (!) 163/57   Pulse (!) 59   Temp (!) 97.3 F (36.3 C) (Temporal)   Ht 5' 6" (1.676 m)   Wt 143 lb (64.9 kg)   SpO2 97%   BMI 23.08 kg/m    Subjective:   Patient ID: Chad [name], male    DOB: 09-Dec-1931, 86 y.o.   MRN: [id number]  HPI: Chad [name] is a 86 y.o. male presenting on 03/26/2019 for Medical Management of Chronic Issues (3 month )   HPI Type 2 diabetes mellitus Patient comes in today for recheck of his diabetes. Patient has been currently taking Lantus, 5 units but only as needed when he is on steroids, mostly his diet controlled. Patient is not currently on an ACE inhibitor/ARB. Patient has not seen an ophthalmologist this year. Patient denies any issues with their feet.   Hypertension Patient is currently on no medication currently has been diet controlled, does use Lasix, and their blood pressure today is 163/57, he says his home blood pressures are in the 130s and 120s.. Patient denies any lightheadedness or dizziness. Patient denies headaches, blurred vision, chest pains, shortness of breath, or weakness. Denies any side effects from medication and is content with current medication.   Hyperlipidemia Patient is coming in for recheck of his hyperlipidemia. The patient is currently taking atorvastatin and fish oil. They deny any issues with myalgias or history of liver damage from it. They deny any focal numbness or weakness or chest pain.  A. fib recheck Target goal: On Eliquis but has been off of it because a price issues Reason on anticoagulation: A. fib chronic Patient denies any bruising or bleeding or chest pain or palpitations  BPH Patient is coming in for recheck on BPH Symptoms: None Medication: None Last PSA: 12/22/2018, 1.2  Gout Last attack: None recently Attacks this year: None Medication: Allopurinol Location of attacks: Right foot and toe  Relevant past medical, surgical, family and social history reviewed and updated as indicated.  Interim medical history since our last visit reviewed. Allergies and medications reviewed and updated.  Review of Systems  Constitutional: Negative for chills and fever.  Eyes: Negative for discharge.  Respiratory: Negative for shortness of breath and wheezing.   Cardiovascular: Negative for chest pain and leg swelling.  Musculoskeletal: Negative for back pain and gait problem.  Skin: Negative for rash.  Neurological: Negative for dizziness, weakness and light-headedness.  All other systems reviewed and are negative.   Per HPI unless specifically indicated above   Allergies as of 03/26/2019   No Known Allergies     Medication List       Accurate as of March 26, 2019 11:56 AM. If you have any questions, ask your nurse or doctor.        allopurinol 100 MG tablet Commonly known as: ZYLOPRIM Take 1 tablet (100 mg total) by mouth daily.   apixaban 5 MG Tabs tablet Commonly known as: Eliquis Take 1 tablet (5 mg total) by mouth 2 (two) times daily.   aspirin EC 81 MG tablet Take 81 mg by mouth daily.   atorvastatin 20 MG tablet Commonly known as: LIPITOR TAKE 1 TABLET BY MOUTH ONCE DAILY AT 6PM   fish oil-omega-3 fatty acids 1000 MG capsule Take 1 g by mouth daily.   furosemide 40 MG tablet Commonly known as: LASIX Take 1 tablet by mouth once daily   glucose blood test strip Commonly known as: ONE TOUCH ULTRA TEST USE TO CHECK GLUCOSE THREE  TIMES DAILY   Lantus SoloStar 100 UNIT/ML Solostar Pen Generic drug: insulin glargine Inject 5 Units into the skin daily.   onetouch ultrasoft lancets Test TId. DX E11.9   Pen Needles 32G X 4 MM Misc 1 each by Does not apply route 4 (four) times daily. Dx: E11.65   PROBIOTIC DAILY PO Take 1 capsule by mouth daily.   Vitamin D3 50 MCG (2000 UT) Tabs Take 2,000 Units by mouth daily.        Objective:   BP (!) 163/57   Pulse (!) 59   Temp (!) 97.3 F (36.3 C) (Temporal)   Ht 5' 6" (1.676 m)   Wt 143 lb (64.9 kg)    SpO2 97%   BMI 23.08 kg/m   Wt Readings from Last 3 Encounters:  03/26/19 143 lb (64.9 kg)  12/22/18 143 lb 3.2 oz (65 kg)  09/16/18 148 lb 12.8 oz (67.5 kg)    Physical Exam Vitals and nursing note reviewed.  Constitutional:      General: He is not in acute distress.    Appearance: He is well-developed. He is not diaphoretic.  Eyes:     General: No scleral icterus.    Conjunctiva/sclera: Conjunctivae normal.  Neck:     Thyroid: No thyromegaly.  Cardiovascular:     Rate and Rhythm: Normal rate and regular rhythm.     Heart sounds: Normal heart sounds. No murmur.  Pulmonary:     Effort: Pulmonary effort is normal. No respiratory distress.     Breath sounds: Normal breath sounds. No wheezing.  Musculoskeletal:        General: Normal range of motion.     Cervical back: Neck supple.  Lymphadenopathy:     Cervical: No cervical adenopathy.  Skin:    General: Skin is warm and dry.     Findings: No rash.  Neurological:     Mental Status: He is alert and oriented to person, place, and time.     Coordination: Coordination normal.  Psychiatric:        Behavior: Behavior normal.    Assessment & Plan:   Problem List Items Addressed This Visit      Cardiovascular and Mediastinum   HTN (hypertension)   Relevant Medications   atorvastatin (LIPITOR) 20 MG tablet   apixaban (ELIQUIS) 5 MG TABS tablet   furosemide (LASIX) 40 MG tablet   Atrial fibrillation, chronic (HCC)   Relevant Medications   atorvastatin (LIPITOR) 20 MG tablet   apixaban (ELIQUIS) 5 MG TABS tablet   furosemide (LASIX) 40 MG tablet     Endocrine   Type 2 diabetes mellitus with other specified complication (HCC)   Relevant Medications   atorvastatin (LIPITOR) 20 MG tablet   insulin glargine (LANTUS SOLOSTAR) 100 UNIT/ML Solostar Pen   Other Relevant Orders   Bayer DCA Hb A1c Waived (Completed)   BMP8+EGFR (Completed)     Genitourinary   BPH (benign prostatic hyperplasia) - Primary     Other    Hyperlipidemia LDL goal <70   Relevant Medications   atorvastatin (LIPITOR) 20 MG tablet   apixaban (ELIQUIS) 5 MG TABS tablet   furosemide (LASIX) 40 MG tablet    Other Visit Diagnoses    Gout, unspecified cause, unspecified chronicity, unspecified site       Relevant Medications   allopurinol (ZYLOPRIM) 100 MG tablet      Patient is not currently on Eliquis due to price, we will try for prescription assistance to see if we   can get him back on it.  He is going to give it another shot, they do not want to do Coumadin because of all the draws and having to come into the office frequently.  Patient is is taking aspirin currently because he could not get the Eliquis. Follow up plan: Return in about 3 months (around 06/26/2019), or if symptoms worsen or fail to improve, for Diabetes and hypertension.  Counseling provided for all of the vaccine components Orders Placed This Encounter  Procedures  . Bayer DCA Hb A1c Waived    Joshua Dettinger, MD [facility] 03/26/2019, 11:56 AM     

## 2019-03-27 LAB — BMP8+EGFR
BUN/Creatinine Ratio: 20 (ref 10–24)
BUN: 19 mg/dL (ref 8–27)
CO2: 24 mmol/L (ref 20–29)
Calcium: 8.6 mg/dL (ref 8.6–10.2)
Chloride: 102 mmol/L (ref 96–106)
Creatinine, Ser: 0.97 mg/dL (ref 0.76–1.27)
GFR calc Af Amer: 81 mL/min/{1.73_m2} (ref >59)
GFR calc non Af Amer: 70 mL/min/{1.73_m2} (ref >59)
Glucose: 192 mg/dL — ABNORMAL HIGH (ref 65–99)
Potassium: 4.1 mmol/L (ref 3.5–5.2)
Sodium: 140 mmol/L (ref 134–144)

## 2019-03-31 NOTE — Progress Notes (Signed)
LMTCB, JBB, 3/9

## 2019-06-26 ENCOUNTER — Other Ambulatory Visit: Payer: Self-pay

## 2019-06-26 ENCOUNTER — Ambulatory Visit (INDEPENDENT_AMBULATORY_CARE_PROVIDER_SITE_OTHER): Payer: Medicare Other | Admitting: Family Medicine

## 2019-06-26 ENCOUNTER — Encounter: Payer: Self-pay | Admitting: Family Medicine

## 2019-06-26 VITALS — BP 164/62 | HR 73 | Temp 98.6°F | Ht 66.0 in | Wt 140.0 lb

## 2019-06-26 DIAGNOSIS — E1169 Type 2 diabetes mellitus with other specified complication: Secondary | ICD-10-CM | POA: Diagnosis not present

## 2019-06-26 DIAGNOSIS — I482 Chronic atrial fibrillation, unspecified: Secondary | ICD-10-CM | POA: Diagnosis not present

## 2019-06-26 DIAGNOSIS — I10 Essential (primary) hypertension: Secondary | ICD-10-CM

## 2019-06-26 DIAGNOSIS — E785 Hyperlipidemia, unspecified: Secondary | ICD-10-CM

## 2019-06-26 LAB — BAYER DCA HB A1C WAIVED: HB A1C (BAYER DCA - WAIVED): 7.2 % — ABNORMAL HIGH (ref <7.0)

## 2019-06-26 MED ORDER — METFORMIN HCL 500 MG PO TABS: 500.0000 mg | ORAL_TABLET | Freq: Two times a day (BID) | ORAL | 3 refills | Status: AC

## 2019-06-26 MED ORDER — METOPROLOL SUCCINATE ER 25 MG PO TB24: 25.0000 mg | ORAL_TABLET | Freq: Every day | ORAL | 3 refills | Status: AC

## 2019-06-26 NOTE — Progress Notes (Signed)
BP (!) 164/62    Pulse 73    Temp 98.6 F (37 C)    Ht 5\' 6"  (1.676 m)    Wt 140 lb (63.5 kg)    SpO2 96%    BMI 22.60 kg/m    Subjective:   Patient ID: Chad [name], male    DOB: July 29, 1931, 86 y.o.   MRN: [id number]  HPI: Chad [name] is a 86 y.o. male presenting on 06/26/2019 for Medical Management of Chronic Issues and Diabetes   HPI Type 2 diabetes mellitus Patient comes in today for recheck of his diabetes. Patient has been currently taking Lantus 4 units sometimes and sometimes twice a day, gets some lows down in the low 70s and 60s and then highs up into the 180s and 200s. Patient is not currently on an ACE inhibitor/ARB. Patient has not seen an ophthalmologist this year. Patient denies any issues with their feet. The symptom started onset as an adult hypertension and hyperlipidemia and A. fib ARE RELATED TO DM   Hypertension Patient is currently on furosemide as needed, and their blood pressure today is 164/62. Patient denies any lightheadedness or dizziness. Patient denies headaches, blurred vision, chest pains, shortness of breath, or weakness. Denies any side effects from medication and is content with current medication.   Hyperlipidemia Patient is coming in for recheck of his hyperlipidemia. The patient is currently taking atorvastatin. They deny any issues with myalgias or history of liver damage from it. They deny any focal numbness or weakness or chest pain.   Afib Patient is not currently on Eliquis because of affordability, were trying through prescription assistance but he has not gotten yet. Patient denies any bruising or bleeding or chest pain or palpitations   Relevant past medical, surgical, family and social history reviewed and updated as indicated. Interim medical history since our last visit reviewed. Allergies and medications reviewed and updated.  Review of Systems  Constitutional: Negative for chills and fever.  Eyes: Negative for visual  disturbance.  Respiratory: Negative for shortness of breath and wheezing.   Cardiovascular: Negative for chest pain and leg swelling.  Musculoskeletal: Negative for back pain and gait problem.  Skin: Negative for rash.  Neurological: Negative for dizziness, weakness and light-headedness.  All other systems reviewed and are negative.   Per HPI unless specifically indicated above   Allergies as of 06/26/2019   No Known Allergies     Medication List       Accurate as of June 26, 2019 10:46 AM. If you have any questions, ask your nurse or doctor.        allopurinol 100 MG tablet Commonly known as: ZYLOPRIM Take 1 tablet (100 mg total) by mouth daily.   apixaban 5 MG Tabs tablet Commonly known as: Eliquis Take 1 tablet (5 mg total) by mouth 2 (two) times daily.   aspirin EC 81 MG tablet Take 81 mg by mouth daily.   atorvastatin 20 MG tablet Commonly known as: LIPITOR TAKE 1 TABLET BY MOUTH ONCE DAILY AT 6PM   fish oil-omega-3 fatty acids 1000 MG capsule Take 1 g by mouth daily.   furosemide 40 MG tablet Commonly known as: LASIX Take 1 tablet (40 mg total) by mouth daily.   glucose blood test strip Commonly known as: ONE TOUCH ULTRA TEST USE TO CHECK GLUCOSE THREE TIMES DAILY   Lantus SoloStar 100 UNIT/ML Solostar Pen Generic drug: insulin glargine Inject 5 Units into the skin daily.   onetouch ultrasoft  lancets Test TId. DX E11.9   Pen Needles 32G X 4 MM Misc 1 each by Does not apply route 4 (four) times daily. Dx: E11.65   PROBIOTIC DAILY PO Take 1 capsule by mouth daily.   Vitamin D3 50 MCG (2000 UT) Tabs Take 2,000 Units by mouth daily.        Objective:   BP (!) 164/62    Pulse 73    Temp 98.6 F (37 C)    Ht 5\' 6"  (1.676 m)    Wt 140 lb (63.5 kg)    SpO2 96%    BMI 22.60 kg/m   Wt Readings from Last 3 Encounters:  06/26/19 140 lb (63.5 kg)  03/26/19 143 lb (64.9 kg)  12/22/18 143 lb 3.2 oz (65 kg)    Physical Exam Vitals and nursing note  reviewed.  Constitutional:      General: He is not in acute distress.    Appearance: He is well-developed. He is not diaphoretic.  Eyes:     General: No scleral icterus.       Right eye: No discharge.     Conjunctiva/sclera: Conjunctivae normal.     Pupils: Pupils are equal, round, and reactive to light.  Neck:     Thyroid: No thyromegaly.  Cardiovascular:     Rate and Rhythm: Normal rate. Rhythm irregular.     Heart sounds: Normal heart sounds. No murmur.  Pulmonary:     Effort: Pulmonary effort is normal. No respiratory distress.     Breath sounds: Normal breath sounds. No wheezing.  Musculoskeletal:        General: Normal range of motion.     Cervical back: Neck supple.  Lymphadenopathy:     Cervical: No cervical adenopathy.  Skin:    General: Skin is warm and dry.     Findings: No rash.  Neurological:     Mental Status: He is alert and oriented to person, place, and time.     Coordination: Coordination normal.  Psychiatric:        Behavior: Behavior normal.       Assessment & Plan:   Problem List Items Addressed This Visit      Cardiovascular and Mediastinum   HTN (hypertension)   Relevant Medications   metoprolol succinate (TOPROL-XL) 25 MG 24 hr tablet   Atrial fibrillation, chronic (HCC)   Relevant Medications   metoprolol succinate (TOPROL-XL) 25 MG 24 hr tablet     Endocrine   Type 2 diabetes mellitus with other specified complication (HCC) - Primary   Relevant Medications   metFORMIN (GLUCOPHAGE) 500 MG tablet   metoprolol succinate (TOPROL-XL) 25 MG 24 hr tablet   Other Relevant Orders   Bayer DCA Hb A1c Waived   Microalbumin / creatinine urine ratio     Other   Hyperlipidemia LDL goal <70   Relevant Medications   metoprolol succinate (TOPROL-XL) 25 MG 24 hr tablet      I do not know why the patient is on Lantus only and has never ever tried any orals.  He is listed as a type II diabetic and has always been told he is a type II diabetic.  He is  only using 4 units of Lantus.  We will stop the Lantus because he is getting some lows and highs, will we will switch him to Metformin and stop the Lantus.  A1c 7.1 today  Patient is still not on Eliquis because of problems with prescription assistance, will refer to [facility]  our pharmacist. Follow up plan: Return in about 3 months (around 09/26/2019), or if symptoms worsen or fail to improve, for Diabetes recheck.  Counseling provided for all of the vaccine components Orders Placed This Encounter  Procedures   Bayer [place] Hb A1c [place] Jaz [place], MD [facility] 06/26/2019, 10:46 AM

## 2019-06-26 NOTE — Patient Instructions (Signed)
Stop the Lantus and take metformin instead

## 2019-06-30 ENCOUNTER — Ambulatory Visit: Payer: Medicare Other | Admitting: Pharmacist

## 2019-07-02 ENCOUNTER — Ambulatory Visit: Payer: Medicare Other | Admitting: Pharmacist

## 2019-07-08 ENCOUNTER — Ambulatory Visit: Payer: Medicare Other | Admitting: Pharmacist

## 2019-09-30 ENCOUNTER — Ambulatory Visit: Payer: Medicare Other | Admitting: Family Medicine

## 2019-09-30 ENCOUNTER — Other Ambulatory Visit: Payer: Self-pay | Admitting: Family Medicine

## 2019-09-30 DIAGNOSIS — E1169 Type 2 diabetes mellitus with other specified complication: Secondary | ICD-10-CM

## 2019-11-11 ENCOUNTER — Encounter: Payer: Self-pay | Admitting: Family Medicine

## 2019-11-11 ENCOUNTER — Ambulatory Visit (INDEPENDENT_AMBULATORY_CARE_PROVIDER_SITE_OTHER): Payer: Medicare Other | Admitting: Family Medicine

## 2019-11-11 ENCOUNTER — Other Ambulatory Visit: Payer: Self-pay

## 2019-11-11 VITALS — BP 172/61 | HR 76 | Temp 97.0°F | Ht 66.0 in | Wt 136.0 lb

## 2019-11-11 DIAGNOSIS — E1169 Type 2 diabetes mellitus with other specified complication: Secondary | ICD-10-CM

## 2019-11-11 DIAGNOSIS — Z5309 Procedure and treatment not carried out because of other contraindication: Secondary | ICD-10-CM

## 2019-11-11 DIAGNOSIS — I10 Essential (primary) hypertension: Secondary | ICD-10-CM | POA: Diagnosis not present

## 2019-11-11 DIAGNOSIS — N400 Enlarged prostate without lower urinary tract symptoms: Secondary | ICD-10-CM

## 2019-11-11 DIAGNOSIS — E785 Hyperlipidemia, unspecified: Secondary | ICD-10-CM | POA: Diagnosis not present

## 2019-11-11 DIAGNOSIS — Z23 Encounter for immunization: Secondary | ICD-10-CM | POA: Diagnosis not present

## 2019-11-11 DIAGNOSIS — I5042 Chronic combined systolic (congestive) and diastolic (congestive) heart failure: Secondary | ICD-10-CM

## 2019-11-11 LAB — BAYER DCA HB A1C WAIVED: HB A1C (BAYER DCA - WAIVED): 6.8 % (ref <7.0)

## 2019-11-11 NOTE — Progress Notes (Signed)
BP (!) 172/61   Pulse 76   Temp (!) 97 F (36.1 C)   Ht 5' 6"  (1.676 m)   Wt 136 lb (61.7 kg)   SpO2 96%   BMI 21.95 kg/m    Subjective:   Patient ID: Chad [name], male    DOB: 12-28-1931, 86 y.o.   MRN: [id number]  HPI: Chad [name] is a 86 y.o. male presenting on 11/11/2019 for Medical Management of Chronic Issues, Diabetes, and Hypertension   HPI Type 2 diabetes mellitus Patient comes in today for recheck of his diabetes. Patient has been currently taking Metformin and Lantus. Patient is not currently on an ACE inhibitor/ARB. Patient has not seen an ophthalmologist this year. Patient denies any issues with their feet. The symptom started onset as an adult hypertension and hyperlipidemia and chronic A. fib ARE RELATED TO DM   Hypertension Patient is currently on metoprolol and furosemide, and their blood pressure today is 172/61 although he says his blood pressures at home are running a lot better in the 130s over 40s. Patient denies any lightheadedness or dizziness. Patient denies headaches, blurred vision, chest pains, shortness of breath, or weakness. Denies any side effects from medication and is content with current medication.   Hyperlipidemia Patient is coming in for recheck of his hyperlipidemia. The patient is currently taking fish oil and atorvastatin. They deny any issues with myalgias or history of liver damage from it. They deny any focal numbness or weakness or chest pain.   BPH Patient is coming in for recheck on BPH Symptoms: None currently Medication: None currently Last PSA: 1 year ago was 1.2  Patient is on Eliquis for chronic A. fib although he has been off of it for almost a year because of financial issues.  Patient has been more unsteady and they were concerned about the blood thinner anyways because he bleeds so easy already, have opted that we are not going to do any more anticoagulation for A. fib because of risks.  They do not want to go back  on Coumadin.  They will take aspirin  Relevant past medical, surgical, family and social history reviewed and updated as indicated. Interim medical history since our last visit reviewed. Allergies and medications reviewed and updated.  Review of Systems  Constitutional: Negative for chills and fever.  Eyes: Negative for visual disturbance.  Respiratory: Negative for shortness of breath and wheezing.   Cardiovascular: Negative for chest pain and leg swelling.  Musculoskeletal: Negative for back pain and gait problem.  Skin: Negative for rash.  Neurological: Negative for dizziness, weakness and numbness.  All other systems reviewed and are negative.   Per HPI unless specifically indicated above   Allergies as of 11/11/2019   No Known Allergies     Medication List       Accurate as of November 11, 2019  8:29 AM. If you have any questions, ask your nurse or doctor.        allopurinol 100 MG tablet Commonly known as: ZYLOPRIM Take 1 tablet (100 mg total) by mouth daily.   apixaban 5 MG Tabs tablet Commonly known as: Eliquis Take 1 tablet (5 mg total) by mouth 2 (two) times daily.   aspirin EC 81 MG tablet Take 81 mg by mouth daily.   atorvastatin 20 MG tablet Commonly known as: LIPITOR TAKE 1 TABLET BY MOUTH ONCE DAILY AT 6PM   fish oil-omega-3 fatty acids 1000 MG capsule Take 1 g by mouth daily.  furosemide 40 MG tablet Commonly known as: LASIX Take 1 tablet (40 mg total) by mouth daily.   glucose blood test strip Commonly known as: ONE TOUCH ULTRA TEST USE TO CHECK GLUCOSE THREE TIMES DAILY   Lantus SoloStar 100 UNIT/ML Solostar Pen Generic drug: insulin glargine INJECT 40 UNITS SUBCUTANEOUSLY AT BEDTIME   metFORMIN 500 MG tablet Commonly known as: GLUCOPHAGE Take 1 tablet (500 mg total) by mouth 2 (two) times daily with a meal.   metoprolol succinate 25 MG 24 hr tablet Commonly known as: TOPROL-XL Take 1 tablet (25 mg total) by mouth daily.   onetouch  ultrasoft lancets Test TId. DX E11.9   Pen Needles 32G X 4 MM Misc 1 each by Does not apply route 4 (four) times daily. Dx: E11.65   PROBIOTIC DAILY PO Take 1 capsule by mouth daily.   Vitamin D3 50 MCG (2000 UT) Tabs Take 2,000 Units by mouth daily.        Objective:   BP (!) 172/61   Pulse 76   Temp (!) 97 F (36.1 C)   Ht 5' 6"  (1.676 m)   Wt 136 lb (61.7 kg)   SpO2 96%   BMI 21.95 kg/m   Wt Readings from Last 3 Encounters:  11/11/19 136 lb (61.7 kg)  06/26/19 140 lb (63.5 kg)  03/26/19 143 lb (64.9 kg)    Physical Exam Vitals and nursing note reviewed.  Constitutional:      General: He is not in acute distress.    Appearance: He is well-developed. He is not diaphoretic.  Eyes:     General: No scleral icterus.    Conjunctiva/sclera: Conjunctivae normal.  Neck:     Thyroid: No thyromegaly.  Cardiovascular:     Rate and Rhythm: Normal rate and regular rhythm.     Heart sounds: Normal heart sounds. No murmur heard.   Pulmonary:     Effort: Pulmonary effort is normal. No respiratory distress.     Breath sounds: Normal breath sounds. No wheezing.  Musculoskeletal:        General: Normal range of motion.     Cervical back: Neck supple.  Lymphadenopathy:     Cervical: No cervical adenopathy.  Skin:    General: Skin is warm and dry.     Findings: No rash.  Neurological:     Mental Status: He is alert and oriented to person, place, and time.     Coordination: Coordination normal.  Psychiatric:        Behavior: Behavior normal.     Results for orders placed or performed in visit on 06/26/19  Bayer DCA Hb A1c Waived  Result Value Ref Range   HB A1C (BAYER DCA - WAIVED) 7.2 (H) <7.0 %    Assessment & Plan:   Problem List Items Addressed This Visit      Cardiovascular and Mediastinum   HTN (hypertension)   Relevant Orders   CMP14+EGFR (Completed)   CHF (congestive heart failure) ([place])     Endocrine   Type 2 diabetes mellitus with other specified  complication ([place]) - Primary   Relevant Orders   Bayer DCA Hb A1c Waived (Completed)   CBC with Differential/Platelet (Completed)     Genitourinary   BPH (benign prostatic hyperplasia)   Relevant Orders   PSA, total and free (Completed)     Other   Hyperlipidemia LDL goal <70   Relevant Orders   Lipid panel (Completed)   Medical contraindication to anticoagulant medication  Other Visit Diagnoses    Flu vaccine need       Relevant Orders   Flu Vaccine QUAD High Dose(Fluad) (Completed)   Need for prophylactic vaccination with Streptococcus pneumoniae (Pneumococcus) and Influenza vaccines       Relevant Orders   Pneumococcal polysaccharide vaccine 23-valent greater than or equal to 2yo subcutaneous/IM (Completed)      We will check blood work.  No change in medication.  Patient seems to be doing well. Follow up plan: Return in about 3 months (around 02/11/2020), or if symptoms worsen or fail to improve, for Diabetes and hypertension recheck.  Counseling provided for all of the vaccine components Orders Placed This Encounter  Procedures  . Pneumococcal polysaccharide vaccine 23-valent greater than or equal to 2yo subcutaneous/IM  . Flu Vaccine QUAD High Dose(Fluad)  . Bayer [facility] Hb A1c [place], MD [facility] 11/11/2019, 8:29 AM

## 2019-11-12 LAB — CBC WITH DIFFERENTIAL/PLATELET
Basophils Absolute: 0.1 10*3/uL (ref 0.0–0.2)
Basos: 1 %
EOS (ABSOLUTE): 0 10*3/uL (ref 0.0–0.4)
Eos: 0 %
Hematocrit: 34.2 % — ABNORMAL LOW (ref 37.5–51.0)
Hemoglobin: 11.3 g/dL — ABNORMAL LOW (ref 13.0–17.7)
Immature Grans (Abs): 0.1 10*3/uL (ref 0.0–0.1)
Immature Granulocytes: 1 %
Lymphocytes Absolute: 1.6 10*3/uL (ref 0.7–3.1)
Lymphs: 33 %
MCH: 32.6 pg (ref 26.6–33.0)
MCHC: 33 g/dL (ref 31.5–35.7)
MCV: 99 fL — ABNORMAL HIGH (ref 79–97)
Monocytes Absolute: 0.5 10*3/uL (ref 0.1–0.9)
Monocytes: 10 %
Neutrophils Absolute: 2.8 10*3/uL (ref 1.4–7.0)
Neutrophils: 55 %
Platelets: 130 10*3/uL — ABNORMAL LOW (ref 150–450)
RBC: 3.47 x10E6/uL — ABNORMAL LOW (ref 4.14–5.80)
RDW: 16.8 % — ABNORMAL HIGH (ref 11.6–15.4)
WBC: 5.1 10*3/uL (ref 3.4–10.8)

## 2019-11-12 LAB — CMP14+EGFR
ALT: 7 IU/L (ref 0–44)
AST: 11 IU/L (ref 0–40)
Albumin/Globulin Ratio: 1.4 (ref 1.2–2.2)
Albumin: 3.7 g/dL (ref 3.6–4.6)
Alkaline Phosphatase: 87 IU/L (ref 44–121)
BUN/Creatinine Ratio: 14 (ref 10–24)
BUN: 13 mg/dL (ref 8–27)
Bilirubin Total: 0.4 mg/dL (ref 0.0–1.2)
CO2: 25 mmol/L (ref 20–29)
Calcium: 8.5 mg/dL — ABNORMAL LOW (ref 8.6–10.2)
Chloride: 98 mmol/L (ref 96–106)
Creatinine, Ser: 0.92 mg/dL (ref 0.76–1.27)
GFR calc Af Amer: 87 mL/min/1.73
GFR calc non Af Amer: 75 mL/min/1.73
Globulin, Total: 2.7 g/dL (ref 1.5–4.5)
Glucose: 150 mg/dL — ABNORMAL HIGH (ref 65–99)
Potassium: 3.9 mmol/L (ref 3.5–5.2)
Sodium: 136 mmol/L (ref 134–144)
Total Protein: 6.4 g/dL (ref 6.0–8.5)

## 2019-11-12 LAB — PSA, TOTAL AND FREE
PSA, Free Pct: 28 %
PSA, Free: 0.42 ng/mL
Prostate Specific Ag, Serum: 1.5 ng/mL (ref 0.0–4.0)

## 2019-11-12 LAB — LIPID PANEL
Chol/HDL Ratio: 4.8 ratio (ref 0.0–5.0)
Cholesterol, Total: 244 mg/dL — ABNORMAL HIGH (ref 100–199)
HDL: 51 mg/dL
LDL Chol Calc (NIH): 178 mg/dL — ABNORMAL HIGH (ref 0–99)
Triglycerides: 86 mg/dL (ref 0–149)
VLDL Cholesterol Cal: 15 mg/dL (ref 5–40)

## 2019-12-09 ENCOUNTER — Other Ambulatory Visit: Payer: Self-pay | Admitting: Family Medicine

## 2019-12-09 DIAGNOSIS — M109 Gout, unspecified: Secondary | ICD-10-CM

## 2020-02-11 ENCOUNTER — Ambulatory Visit: Payer: Medicare Other | Admitting: Family Medicine

## 2020-03-09 ENCOUNTER — Other Ambulatory Visit: Payer: Self-pay | Admitting: Family Medicine

## 2020-03-09 DIAGNOSIS — M109 Gout, unspecified: Secondary | ICD-10-CM

## 2020-03-11 ENCOUNTER — Telehealth: Payer: Self-pay | Admitting: Family Medicine

## 2020-03-11 NOTE — Telephone Encounter (Signed)
NA/NVM, pharmacy did receive refill, it is to early, will have to wait till first of March

## 2020-03-11 NOTE — Telephone Encounter (Signed)
  Prescription Request  03/11/2020  What is the name of the medication or equipment? Furosemide  Have you contacted your pharmacy to request a refill? (if applicable) yes, they said they do not have them   Which pharmacy would you like this sent to? [facility] in [place]   Patient notified that their request is being sent to the clinical staff for review and that they should receive a response within 2 business days.

## 2020-05-18 ENCOUNTER — Other Ambulatory Visit: Payer: Self-pay | Admitting: Family Medicine

## 2020-05-18 DIAGNOSIS — Z794 Long term (current) use of insulin: Secondary | ICD-10-CM

## 2020-05-18 DIAGNOSIS — E1169 Type 2 diabetes mellitus with other specified complication: Secondary | ICD-10-CM

## 2020-06-08 ENCOUNTER — Other Ambulatory Visit: Payer: Self-pay | Admitting: Family Medicine

## 2020-06-08 DIAGNOSIS — M109 Gout, unspecified: Secondary | ICD-10-CM

## 2020-06-08 DIAGNOSIS — E785 Hyperlipidemia, unspecified: Secondary | ICD-10-CM

## 2020-06-29 ENCOUNTER — Other Ambulatory Visit: Payer: Self-pay

## 2020-06-29 ENCOUNTER — Ambulatory Visit (INDEPENDENT_AMBULATORY_CARE_PROVIDER_SITE_OTHER): Payer: Medicare Other | Admitting: Family Medicine

## 2020-06-29 ENCOUNTER — Encounter: Payer: Self-pay | Admitting: Family Medicine

## 2020-06-29 VITALS — BP 145/57 | HR 56 | Temp 97.5°F | Ht 66.0 in | Wt 151.0 lb

## 2020-06-29 DIAGNOSIS — I509 Heart failure, unspecified: Secondary | ICD-10-CM

## 2020-06-29 DIAGNOSIS — R600 Localized edema: Secondary | ICD-10-CM

## 2020-06-29 MED ORDER — CEPHALEXIN 500 MG PO CAPS: 500.0000 mg | ORAL_CAPSULE | Freq: Two times a day (BID) | ORAL | 0 refills | Status: AC

## 2020-06-29 MED ORDER — FUROSEMIDE 40 MG PO TABS: ORAL_TABLET | ORAL | 0 refills | Status: DC

## 2020-06-29 NOTE — Patient Instructions (Signed)

## 2020-06-29 NOTE — Progress Notes (Signed)
Acute Office Visit  Subjective:    Patient ID: Chad [name], male    DOB: 1931/07/09, 87 y.o.   MRN: [id number]  Chief Complaint  Patient presents with  . Edema    HPI History is provided by Chad [name] and his daughter. Patient is in today for peripheral edema. He has chronic lower leg edema. If has been worse for the last 6 weeks or so, since the weather warmed up. He has been having some leg pain in both thighs for the last 1-2 weeks. His daughter realized that something was wrong when he started have food delivered instead of going to breakfast. That's when he told her that his legs hurt. The reports swelling from his feet to his thighs. He denies shortness of breath, cough, or orthopnea. Hd denies chest pain, dizziness, or focal weakness. He does not weight himself. Denies fever or chills. He takes lasix 40 mg daily. He has been taking this as prescribed and has not missed any dosages. He reports that the swelling improves overnight but within 30 minutes of being up the swelling worsens.   Past Medical History:  Diagnosis Date  . 1st degree AV block   . Atrial fibrillation ([place])   . CAD (coronary artery disease)    a. s/p CABG in 1980's (patient unsure of exact year or grafts)  . Clostridium difficile colitis 08/14/2018   s/p Vanc $Remove'125mg'VrxdzRH$  qid x 10 days with resolution  . Diabetes mellitus without complication ([place])   . Gout   . Hyperlipidemia   . Hypertension   . Neuropathy   . Stroke [facility])     Past Surgical History:  Procedure Laterality Date  . carpel tunnel Bilateral   . CORONARY ARTERY BYPASS GRAFT     heart- x 6  . KIDNEY STONE SURGERY    . PARATHYROIDECTOMY      Family History  Problem Relation Age of Onset  . Cancer Father        throat  . Heart disease Father   . CAD Brother 83       MI questionable, sudden death  . CAD Daughter 40       MI, stent x 2   . CAD Brother   . Cancer Brother        lung   . CAD Brother   . CAD Brother   . CAD Brother   .  CAD Brother   . CAD Brother     Social History   Socioeconomic History  . Marital status: Widowed    Spouse name: Not on file  . Number of children: 1  . Years of education: Not on file  . Highest education level: Not on file  Occupational History  . Occupation: retired     Comment: [employer] [place] Use  . Smoking status: Former Smoker    Types: Cigarettes    Quit date: 06/12/1977    Years since quitting: 43.0  . Smokeless tobacco: Never Used  Vaping Use  . Vaping Use: Never used  Substance and Sexual Activity  . Alcohol use: No  . Drug use: No  . Sexual activity: Not on file  Other Topics Concern  . Not on file  Social History Narrative  . Not on file   Social Determinants of Health   Financial Resource Strain: Not on file  Food Insecurity: Not on file  Transportation Needs: Not on file  Physical Activity: Not on file  Stress: Not on  file  Social Connections: Not on file  Intimate Partner Violence: Not on file    Outpatient Medications Prior to Visit  Medication Sig Dispense Refill  . allopurinol (ZYLOPRIM) 100 MG tablet Take 1 tablet (100 mg total) by mouth daily. (NEEDS TO BE SEEN BEFORE NEXT REFILL) 30 tablet 0  . aspirin EC 81 MG tablet Take 81 mg by mouth daily.    Marland Kitchen atorvastatin (LIPITOR) 20 MG tablet TAKE 1 TABLET BY MOUTH ONCE DAILY AT 6 PM (NEEDS TO BE SEEN BEFORE NEXT REFILL) 30 tablet 0  . Cholecalciferol (VITAMIN D3) 2000 units TABS Take 2,000 Units by mouth daily.    . fish oil-omega-3 fatty acids 1000 MG capsule Take 1 g by mouth daily.    . furosemide (LASIX) 40 MG tablet Take 1 tablet by mouth once daily 90 tablet 0  . glucose blood (ONE TOUCH ULTRA TEST) test strip USE TO CHECK GLUCOSE THREE TIMES DAILY 300 each 2  . insulin glargine (LANTUS SOLOSTAR) 100 UNIT/ML Solostar Pen Inject 40 Units into the skin at bedtime. (NEEDS TO BE SEEN BEFORE NEXT REFILL) 15 mL 0  . Insulin Pen Needle (PEN NEEDLES) 32G X 4 MM MISC 1 each by Does not apply  route 4 (four) times daily. Dx: E11.65 400 each 3  . Lancets (ONETOUCH ULTRASOFT) lancets Test TId. DX E11.9 100 each 5  . metFORMIN (GLUCOPHAGE) 500 MG tablet Take 1 tablet (500 mg total) by mouth 2 (two) times daily with a meal. 180 tablet 3  . metoprolol succinate (TOPROL-XL) 25 MG 24 hr tablet Take 1 tablet (25 mg total) by mouth daily. 90 tablet 3  . Probiotic Product (PROBIOTIC DAILY PO) Take 1 capsule by mouth daily.     No facility-administered medications prior to visit.    No Known Allergies  Review of Systems As per HPI.     Objective:    Physical Exam Vitals and nursing note reviewed.  Constitutional:      General: He is not in acute distress.    Appearance: He is not ill-appearing, toxic-appearing or diaphoretic.  Cardiovascular:     Rate and Rhythm: Normal rate and regular rhythm.     Heart sounds: Normal heart sounds. No murmur heard.   Pulmonary:     Effort: Pulmonary effort is normal. No respiratory distress.     Breath sounds: Normal breath sounds. No stridor. No wheezing, rhonchi or rales.  Abdominal:     General: There is no distension.     Palpations: Abdomen is soft.     Tenderness: There is no abdominal tenderness. There is no guarding or rebound.  Musculoskeletal:     Right lower leg: 3+ Edema present.     Left lower leg: 3+ Edema present.     Comments: Pitting edema to upper and lower legs bilaterally.    Skin:    General: Skin is warm and dry.     Comments: Slight warmth and erythema to bilateral lower legs to mid calf level. No tenderness. No drainage or open wounds.  Neurological:     General: No focal deficit present.     Mental Status: He is alert and oriented to person, place, and time.  Psychiatric:        Mood and Affect: Mood normal.        Behavior: Behavior normal.     BP (!) 145/57   Pulse (!) 56   Temp (!) 97.5 F (36.4 C) (Oral)   Ht $R'5\' 6"'oz$  (1.676  m)   Wt 151 lb (68.5 kg)   BMI 24.37 kg/m  Wt Readings from Last 3  Encounters:  06/29/20 151 lb (68.5 kg)  11/11/19 136 lb (61.7 kg)  06/26/19 140 lb (63.5 kg)    Health Maintenance Due  Topic Date Due  . Zoster Vaccines- Shingrix (1 of 2) Never done  . Pneumococcal Vaccine 77-31 Years old (1 of 2 - PPSV23) Never done  . OPHTHALMOLOGY EXAM  11/08/2016  . TETANUS/TDAP  02/02/2020  . HEMOGLOBIN A1C  05/11/2020  . FOOT EXAM  06/25/2020  . URINE MICROALBUMIN  06/25/2020    There are no preventive care reminders to display for this patient.   Lab Results  Component Value Date   TSH 3.670 07/08/2017   Lab Results  Component Value Date   WBC 5.1 11/11/2019   HGB 11.3 (L) 11/11/2019   HCT 34.2 (L) 11/11/2019   MCV 99 (H) 11/11/2019   PLT 130 (L) 11/11/2019   Lab Results  Component Value Date   NA 136 11/11/2019   K 3.9 11/11/2019   CO2 25 11/11/2019   GLUCOSE 150 (H) 11/11/2019   BUN 13 11/11/2019   CREATININE 0.92 11/11/2019   BILITOT 0.4 11/11/2019   ALKPHOS 87 11/11/2019   AST 11 11/11/2019   ALT 7 11/11/2019   PROT 6.4 11/11/2019   ALBUMIN 3.7 11/11/2019   CALCIUM 8.5 (L) 11/11/2019   ANIONGAP 7 07/14/2017   Lab Results  Component Value Date   CHOL 244 (H) 11/11/2019   Lab Results  Component Value Date   HDL 51 11/11/2019   Lab Results  Component Value Date   LDLCALC 178 (H) 11/11/2019   Lab Results  Component Value Date   TRIG 86 11/11/2019   Lab Results  Component Value Date   CHOLHDL 4.8 11/11/2019   Lab Results  Component Value Date   HGBA1C 6.8 11/11/2019       Assessment & Plan:   Krishang was seen today for edema.  Diagnoses and all orders for this visit:  Edema of both legs Chronic with recent worsening. 3+ pitting edema bilaterally to upper and lower leg with slight warmth and erythema lower legs. Lung clear, no abdominal distention. Hx of heart failure. Labs pending as below. Increase lasix to 40 mg BID x 3 days. Keflex ordered for possible cellulitis of lower legs. Daughter would prefer to try to  hold off of antibiotics for now as patient had a terrible bout with c.diff the last time he had abx. Discussed can pick up and hold off for now, if worsening pain, warmth, erythema, fever, or chills he needs to start abx right away. Follow up with PCP in 2 days, sooner for new or worsening symptoms. Aware of when to seek emergency care.  -     Pro b natriuretic peptide -     BMP8+EGFR -     cephALEXin (KEFLEX) 500 MG capsule; Take 1 capsule (500 mg total) by mouth 2 (two) times daily. -     furosemide (LASIX) 40 MG tablet; Take 1 tablet twice a day for 3 days, then resume 1 tablet daily.  Congestive heart failure, unspecified HF chronicity, unspecified heart failure type (HCC) -     Pro b natriuretic peptide -     BMP8+EGFR -     furosemide (LASIX) 40 MG tablet; Take 1 tablet twice a day for 3 days, then resume 1 tablet daily.  The patient indicates understanding of these issues and agrees  with the plan.  Gwenlyn Perking, FNP

## 2020-06-30 LAB — PRO B NATRIURETIC PEPTIDE: NT-Pro BNP: 13010 pg/mL — ABNORMAL HIGH (ref 0–486)

## 2020-06-30 LAB — BMP8+EGFR
BUN/Creatinine Ratio: 18 (ref 10–24)
BUN: 20 mg/dL (ref 8–27)
CO2: 24 mmol/L (ref 20–29)
Calcium: 8.3 mg/dL — ABNORMAL LOW (ref 8.6–10.2)
Chloride: 102 mmol/L (ref 96–106)
Creatinine, Ser: 1.1 mg/dL (ref 0.76–1.27)
Glucose: 150 mg/dL — ABNORMAL HIGH (ref 65–99)
Potassium: 4.1 mmol/L (ref 3.5–5.2)
Sodium: 141 mmol/L (ref 134–144)
eGFR: 65 mL/min/{1.73_m2} (ref >59)

## 2020-07-01 ENCOUNTER — Other Ambulatory Visit: Payer: Self-pay

## 2020-07-01 ENCOUNTER — Encounter: Payer: Self-pay | Admitting: Family Medicine

## 2020-07-01 ENCOUNTER — Ambulatory Visit (INDEPENDENT_AMBULATORY_CARE_PROVIDER_SITE_OTHER): Payer: Medicare Other | Admitting: Family Medicine

## 2020-07-01 VITALS — BP 132/70 | HR 74 | Ht 66.0 in | Wt 145.0 lb

## 2020-07-01 DIAGNOSIS — E1169 Type 2 diabetes mellitus with other specified complication: Secondary | ICD-10-CM | POA: Diagnosis not present

## 2020-07-01 DIAGNOSIS — I509 Heart failure, unspecified: Secondary | ICD-10-CM | POA: Diagnosis not present

## 2020-07-01 DIAGNOSIS — I10 Essential (primary) hypertension: Secondary | ICD-10-CM | POA: Diagnosis not present

## 2020-07-01 DIAGNOSIS — E785 Hyperlipidemia, unspecified: Secondary | ICD-10-CM

## 2020-07-01 DIAGNOSIS — R600 Localized edema: Secondary | ICD-10-CM

## 2020-07-01 DIAGNOSIS — M109 Gout, unspecified: Secondary | ICD-10-CM

## 2020-07-01 LAB — LIPID PANEL
Chol/HDL Ratio: 5.2 ratio — ABNORMAL HIGH (ref 0.0–5.0)
Cholesterol, Total: 268 mg/dL — ABNORMAL HIGH (ref 100–199)
HDL: 52 mg/dL (ref >39)
LDL Chol Calc (NIH): 202 mg/dL — ABNORMAL HIGH (ref 0–99)
Triglycerides: 85 mg/dL (ref 0–149)
VLDL Cholesterol Cal: 14 mg/dL (ref 5–40)

## 2020-07-01 LAB — CBC WITH DIFFERENTIAL/PLATELET
Basophils Absolute: 0 10*3/uL (ref 0.0–0.2)
Basos: 1 %
EOS (ABSOLUTE): 0 10*3/uL (ref 0.0–0.4)
Eos: 0 %
Hematocrit: 34.7 % — ABNORMAL LOW (ref 37.5–51.0)
Hemoglobin: 11.2 g/dL — ABNORMAL LOW (ref 13.0–17.7)
Immature Grans (Abs): 0.1 10*3/uL (ref 0.0–0.1)
Immature Granulocytes: 1 %
Lymphocytes Absolute: 1.1 10*3/uL (ref 0.7–3.1)
Lymphs: 22 %
MCH: 30.9 pg (ref 26.6–33.0)
MCHC: 32.3 g/dL (ref 31.5–35.7)
MCV: 96 fL (ref 79–97)
Monocytes Absolute: 0.7 10*3/uL (ref 0.1–0.9)
Monocytes: 15 %
Neutrophils Absolute: 3 10*3/uL (ref 1.4–7.0)
Neutrophils: 61 %
Platelets: 112 10*3/uL — ABNORMAL LOW (ref 150–450)
RBC: 3.62 x10E6/uL — ABNORMAL LOW (ref 4.14–5.80)
RDW: 17.3 % — ABNORMAL HIGH (ref 11.6–15.4)
WBC: 4.9 10*3/uL (ref 3.4–10.8)

## 2020-07-01 LAB — BAYER DCA HB A1C WAIVED: HB A1C (BAYER DCA - WAIVED): 7.1 % — ABNORMAL HIGH (ref <7.0)

## 2020-07-01 MED ORDER — FUROSEMIDE 40 MG PO TABS: ORAL_TABLET | ORAL | 3 refills | Status: DC

## 2020-07-01 MED ORDER — ATORVASTATIN CALCIUM 20 MG PO TABS: 20.0000 mg | ORAL_TABLET | Freq: Every day | ORAL | 3 refills | Status: DC

## 2020-07-01 MED ORDER — ALLOPURINOL 100 MG PO TABS
100.0000 mg | ORAL_TABLET | Freq: Every day | ORAL | 3 refills | Status: AC
Start: 2020-07-01 — End: ?

## 2020-07-01 NOTE — Progress Notes (Signed)
BP 132/70   Pulse 74   Ht 5\' 6"  (1.676 m)   Wt 145 lb (65.8 kg)   SpO2 97%   BMI 23.40 kg/m    Subjective:   Patient ID: Chad [name], male    DOB: 03/04/1931, 87 y.o.   MRN: [id number]  HPI: Chad [name] is a 88 y.o. male presenting on 07/01/2020 for Medical Management of Chronic Issues, Hypertension, Hyperlipidemia, and Diabetes   HPI Congestive heart failure Patient is coming in for congestive heart failure follow-up.  He a lot of fluid and swelling all the way up to his thighs and some weight gain and some pain in his legs and some difficulty walking that he saw one of my colleagues a couple days ago and they increased his furosemide.  He takes twice a day for 3 days.  He is on 2 days worth of that and his weight has come down and he has lost 6 pounds in the last couple days.  He is not still down to his previous weight which is 136, he still up about 9 pounds.  We will have him continue to double his Lasix over the next 4 more days for total of 6 days of double and then reduce it from there.  Type 2 diabetes mellitus Patient comes in today for recheck of his diabetes. Patient has been currently taking metformin and Lantus. Patient is not currently on an ACE inhibitor/ARB. Patient has not seen an ophthalmologist this year. Patient denies any issues with their feet. The symptom started onset as an adult hypertension and hyperlipidemia and CHF and A. fib ARE RELATED TO DM   Hypertension Patient is currently on metoprolol and furosemide, and their blood pressure today is 132/70. Patient denies any lightheadedness or dizziness. Patient denies headaches, blurred vision, chest pains, shortness of breath, or weakness. Denies any side effects from medication and is content with current medication.   Hyperlipidemia Patient is coming in for recheck of his hyperlipidemia. The patient is currently taking fish oil and atorvastatin. They deny any issues with myalgias or history of liver  damage from it. They deny any focal numbness or weakness or chest pain.   Relevant past medical, surgical, family and social history reviewed and updated as indicated. Interim medical history since our last visit reviewed. Allergies and medications reviewed and updated.  Review of Systems  Constitutional:  Negative for chills and fever.  Eyes:  Negative for visual disturbance.  Respiratory:  Negative for chest tightness, shortness of breath and wheezing.   Cardiovascular:  Positive for leg swelling. Negative for chest pain.  Musculoskeletal:  Negative for back pain and gait problem.  Skin:  Negative for rash.  Neurological:  Negative for weakness and numbness.  All other systems reviewed and are negative.  Per HPI unless specifically indicated above   Allergies as of 07/01/2020   No Known Allergies      Medication List        Accurate as of July 01, 2020 10:35 AM. If you have any questions, ask your nurse or doctor.          allopurinol 100 MG tablet Commonly known as: ZYLOPRIM Take 1 tablet (100 mg total) by mouth daily. (NEEDS TO BE SEEN BEFORE NEXT REFILL)   aspirin EC 81 MG tablet Take 81 mg by mouth daily.   atorvastatin 20 MG tablet Commonly known as: LIPITOR TAKE 1 TABLET BY MOUTH ONCE DAILY AT 6 PM (NEEDS TO BE SEEN BEFORE  NEXT REFILL)   cephALEXin 500 MG capsule Commonly known as: Keflex Take 1 capsule (500 mg total) by mouth 2 (two) times daily.   fish oil-omega-3 fatty acids 1000 MG capsule Take 1 g by mouth daily.   furosemide 40 MG tablet Commonly known as: LASIX Take 1 tablet twice a day for 3 days, then resume 1 tablet daily.   glucose blood test strip Commonly known as: ONE TOUCH ULTRA TEST USE TO CHECK GLUCOSE THREE TIMES DAILY   Lantus SoloStar 100 UNIT/ML Solostar Pen Generic drug: insulin glargine Inject 40 Units into the skin at bedtime. (NEEDS TO BE SEEN BEFORE NEXT REFILL)   metFORMIN 500 MG tablet Commonly known as:  GLUCOPHAGE Take 1 tablet (500 mg total) by mouth 2 (two) times daily with a meal.   metoprolol succinate 25 MG 24 hr tablet Commonly known as: TOPROL-XL Take 1 tablet (25 mg total) by mouth daily.   onetouch ultrasoft lancets Test TId. DX E11.9   Pen Needles 32G X 4 MM Misc 1 each by Does not apply route 4 (four) times daily. Dx: E11.65   PROBIOTIC DAILY PO Take 1 capsule by mouth daily.   Vitamin D3 50 MCG (2000 UT) Tabs Take 2,000 Units by mouth daily.         Objective:   Pulse 74   Ht 5\' 6"  (1.676 m)   Wt 145 lb (65.8 kg)   SpO2 97%   BMI 23.40 kg/m   Wt Readings from Last 3 Encounters:  07/01/20 145 lb (65.8 kg)  06/29/20 151 lb (68.5 kg)  11/11/19 136 lb (61.7 kg)    Physical Exam Vitals and nursing note reviewed.  Constitutional:      General: He is not in acute distress.    Appearance: He is well-developed. He is not diaphoretic.  Eyes:     General: No scleral icterus.    Conjunctiva/sclera: Conjunctivae normal.  Neck:     Thyroid: No thyromegaly.  Cardiovascular:     Rate and Rhythm: Normal rate and regular rhythm.     Heart sounds: Normal heart sounds. No murmur heard. Pulmonary:     Effort: Pulmonary effort is normal. No respiratory distress.     Breath sounds: Normal breath sounds. No wheezing.  Musculoskeletal:        General: Swelling (2+ pitting edema in lower legs and 1+ up to hips bilaterally) present. Normal range of motion.     Cervical back: Neck supple.  Lymphadenopathy:     Cervical: No cervical adenopathy.  Skin:    General: Skin is warm and dry.     Findings: No rash.  Neurological:     Mental Status: He is alert and oriented to person, place, and time.     Coordination: Coordination normal.  Psychiatric:        Behavior: Behavior normal.      Assessment & Plan:   Problem List Items Addressed This Visit       Cardiovascular and Mediastinum   HTN (hypertension)   CHF (congestive heart failure) (HCC) - Primary      Endocrine   Type 2 diabetes mellitus with other specified complication (HCC)   Relevant Orders   CBC with Differential/Platelet   Lipid panel   Bayer DCA Hb A1c Waived     Other   Hyperlipidemia LDL goal <70   Relevant Orders   CBC with Differential/Platelet   Lipid panel   Bayer DCA Hb A1c Waived   Other Visit Diagnoses  Essential hypertension       Relevant Orders   CBC with Differential/Platelet   Lipid panel   Bayer DCA Hb A1c Waived   Gout, unspecified cause, unspecified chronicity, unspecified site       Edema of both legs         Double Lasix for 4 more days for total of 6 days.  Check BMP and labs today.  Patient is down 6 pounds but has 9 more to go to his baseline. A1c 7.1 so looks good.  No other change medication  Follow up plan: Return in about 3 months (around 10/01/2020), or if symptoms worsen or fail to improve, for Hypertension and CHF recheck..  Counseling provided for all of the vaccine components Orders Placed This Encounter  Procedures   CBC with Differential/Platelet   Lipid panel   Bayer DCA Hb A1c Waived    Arville Care, MD [place] [facility] 07/01/2020, 10:35 AM

## 2020-07-02 LAB — MICROALBUMIN / CREATININE URINE RATIO
Creatinine, Urine: 33.3 mg/dL
Microalb/Creat Ratio: 2280 mg/g creat — ABNORMAL HIGH (ref 0–29)
Microalbumin, Urine: 759.1 ug/mL

## 2020-07-06 ENCOUNTER — Ambulatory Visit (INDEPENDENT_AMBULATORY_CARE_PROVIDER_SITE_OTHER): Payer: Medicare Other

## 2020-07-06 VITALS — Ht 66.0 in | Wt 145.0 lb

## 2020-07-06 DIAGNOSIS — Z0000 Encounter for general adult medical examination without abnormal findings: Secondary | ICD-10-CM

## 2020-07-06 LAB — BASIC METABOLIC PANEL
BUN/Creatinine Ratio: 16 (ref 10–24)
BUN: 19 mg/dL (ref 8–27)
CO2: 22 mmol/L (ref 20–29)
Calcium: 8.6 mg/dL (ref 8.6–10.2)
Chloride: 97 mmol/L (ref 96–106)
Creatinine, Ser: 1.16 mg/dL (ref 0.76–1.27)
Glucose: 144 mg/dL — ABNORMAL HIGH (ref 65–99)
Potassium: 4.6 mmol/L (ref 3.5–5.2)
Sodium: 139 mmol/L (ref 134–144)
eGFR: 61 mL/min/{1.73_m2} (ref >59)

## 2020-07-06 LAB — SPECIMEN STATUS REPORT

## 2020-07-06 NOTE — Patient Instructions (Signed)
Chad [name] , Thank you for taking time to come for your Medicare Wellness Visit. I appreciate your ongoing commitment to your health goals. Please review the following plan we discussed and let me know if I can assist you in the future.   Screening recommendations/referrals: Colonoscopy: No longer required Recommended yearly ophthalmology/optometry visit for glaucoma screening and checkup Recommended yearly dental visit for hygiene and checkup  Vaccinations: Influenza vaccine: Done 11/11/2019 - Repeat annually Pneumococcal vaccine: Done 11/13/2012 & 11/11/2019 Tdap vaccine: Done 02/01/2010 - Repeat in 10 years Due now Shingles vaccine: Zostavax done 05/19/2019 - due for Shingrix   Covid-19: Done (we need dates)  Advanced directives: Advance directive discussed with you today. Even though you declined this today, please call our office should you change your mind, and we can give you the proper paperwork for you to fill out.  Conditions/risks identified: Aim for 30 minutes of exercise or brisk walking each day, drink 6-8 glasses of water and eat lots of fruits and vegetables.  Next appointment: Follow up in one year for your annual wellness visit.   Preventive Care 26 Years and Older, Male  Preventive care refers to lifestyle choices and visits with your health care provider that can promote health and wellness. What does preventive care include? A yearly physical exam. This is also called an annual well check. Dental exams once or twice a year. Routine eye exams. Ask your health care provider how often you should have your eyes checked. Personal lifestyle choices, including: Daily care of your teeth and gums. Regular physical activity. Eating a healthy diet. Avoiding tobacco and drug use. Limiting alcohol use. Practicing safe sex. Taking low doses of aspirin every day. Taking vitamin and mineral supplements as recommended by your health care provider. What happens during an annual  well check? The services and screenings done by your health care provider during your annual well check will depend on your age, overall health, lifestyle risk factors, and family history of disease. Counseling  Your health care provider may ask you questions about your: Alcohol use. Tobacco use. Drug use. Emotional well-being. Home and relationship well-being. Sexual activity. Eating habits. History of falls. Memory and ability to understand (cognition). Work and work Astronomer. Screening  You may have the following tests or measurements: Height, weight, and BMI. Blood pressure. Lipid and cholesterol levels. These may be checked every 5 years, or more frequently if you are over 4 years old. Skin check. Lung cancer screening. You may have this screening every year starting at age 73 if you have a 30-pack-year history of smoking and currently smoke or have quit within the past 15 years. Fecal occult blood test (FOBT) of the stool. You may have this test every year starting at age 39. Flexible sigmoidoscopy or colonoscopy. You may have a sigmoidoscopy every 5 years or a colonoscopy every 10 years starting at age 74. Prostate cancer screening. Recommendations will vary depending on your family history and other risks. Hepatitis C blood test. Hepatitis B blood test. Sexually transmitted disease (STD) testing. Diabetes screening. This is done by checking your blood sugar (glucose) after you have not eaten for a while (fasting). You may have this done every 1-3 years. Abdominal aortic aneurysm (AAA) screening. You may need this if you are a current or former smoker. Osteoporosis. You may be screened starting at age 53 if you are at high risk. Talk with your health care provider about your test results, treatment options, and if necessary, the need for  more tests. Vaccines  Your health care provider Mchugh recommend certain vaccines, such as: Influenza vaccine. This is recommended every  year. Tetanus, diphtheria, and acellular pertussis (Tdap, Td) vaccine. You Chanda need a Td booster every 10 years. Zoster vaccine. You Legan need this after age 5. Pneumococcal 13-valent conjugate (PCV13) vaccine. One dose is recommended after age 41. Pneumococcal polysaccharide (PPSV23) vaccine. One dose is recommended after age 71. Talk to your health care provider about which screenings and vaccines you need and how often you need them. This information is not intended to replace advice given to you by your health care provider. Make sure you discuss any questions you have with your health care provider. Document Released: 02/04/2015 Document Revised: 09/28/2015 Document Reviewed: 11/09/2014 Elsevier Interactive Patient Education  2017 [place] Prevention in the Home Falls can cause injuries. They can happen to people of all ages. There are many things you can do to make your home safe and to help prevent falls. What can I do on the outside of my home? Regularly fix the edges of walkways and driveways and fix any cracks. Remove anything that might make you trip as you walk through a door, such as a raised step or threshold. Trim any bushes or trees on the path to your home. Use bright outdoor lighting. Clear any walking paths of anything that might make someone trip, such as rocks or tools. Regularly check to see if handrails are loose or broken. Make sure that both sides of any steps have handrails. Any raised decks and porches should have guardrails on the edges. Have any leaves, snow, or ice cleared regularly. Use sand or salt on walking paths during winter. Clean up any spills in your garage right away. This includes oil or grease spills. What can I do in the bathroom? Use night lights. Install grab bars by the toilet and in the tub and shower. Do not use towel bars as grab bars. Use non-skid mats or decals in the tub or shower. If you need to sit down in the shower, use a  plastic, non-slip stool. Keep the floor dry. Clean up any water that spills on the floor as soon as it happens. Remove soap buildup in the tub or shower regularly. Attach bath mats securely with double-sided non-slip rug tape. Do not have throw rugs and other things on the floor that can make you trip. What can I do in the bedroom? Use night lights. Make sure that you have a light by your bed that is easy to reach. Do not use any sheets or blankets that are too big for your bed. They should not hang down onto the floor. Have a firm chair that has side arms. You can use this for support while you get dressed. Do not have throw rugs and other things on the floor that can make you trip. What can I do in the kitchen? Clean up any spills right away. Avoid walking on wet floors. Keep items that you use a lot in easy-to-reach places. If you need to reach something above you, use a strong step stool that has a grab bar. Keep electrical cords out of the way. Do not use floor polish or wax that makes floors slippery. If you must use wax, use non-skid floor wax. Do not have throw rugs and other things on the floor that can make you trip. What can I do with my stairs? Do not leave any items on the stairs. Make  sure that there are handrails on both sides of the stairs and use them. Fix handrails that are broken or loose. Make sure that handrails are as long as the stairways. Check any carpeting to make sure that it is firmly attached to the stairs. Fix any carpet that is loose or worn. Avoid having throw rugs at the top or bottom of the stairs. If you do have throw rugs, attach them to the floor with carpet tape. Make sure that you have a light switch at the top of the stairs and the bottom of the stairs. If you do not have them, ask someone to add them for you. What else can I do to help prevent falls? Wear shoes that: Do not have high heels. Have rubber bottoms. Are comfortable and fit you  well. Are closed at the toe. Do not wear sandals. If you use a stepladder: Make sure that it is fully opened. Do not climb a closed stepladder. Make sure that both sides of the stepladder are locked into place. Ask someone to hold it for you, if possible. Clearly mark and make sure that you can see: Any grab bars or handrails. First and last steps. Where the edge of each step is. Use tools that help you move around (mobility aids) if they are needed. These include: Canes. Walkers. Scooters. Crutches. Turn on the lights when you go into a dark area. Replace any light bulbs as soon as they burn out. Set up your furniture so you have a clear path. Avoid moving your furniture around. If any of your floors are uneven, fix them. If there are any pets around you, be aware of where they are. Review your medicines with your doctor. Some medicines can make you feel dizzy. This can increase your chance of falling. Ask your doctor what other things that you can do to help prevent falls. This information is not intended to replace advice given to you by your health care provider. Make sure you discuss any questions you have with your health care provider. Document Released: 11/04/2008 Document Revised: 06/16/2015 Document Reviewed: 02/12/2014 Elsevier Interactive Patient Education  2017 [employer].

## 2020-07-06 NOTE — Progress Notes (Signed)
Subjective:   Chad [name] is a 87 y.o. male who presents for Medicare Annual/Subsequent preventive examination.  Virtual Visit via Telephone Note  I connected with  Chad [name] on 07/06/20 at  3:30 PM EDT by telephone and verified that I am speaking with the correct person using two identifiers.  Location: Patient: Home Provider: [facility] Persons participating in the virtual visit: patient and his daughter Chad [name]   I discussed the limitations, risks, security and privacy concerns of performing an evaluation and management service by telephone and the availability of in person appointments. The patient expressed understanding and agreed to proceed.  Interactive audio and video telecommunications were attempted between this nurse and patient, however failed, due to patient having technical difficulties OR patient did not have access to video capability.  We continued and completed visit with audio only.  Some vital signs may be absent or patient reported.   Gerri Acre E Shadee Montoya, LPN   Review of Systems     Cardiac Risk Factors include: advanced age (>74men, >72 women);diabetes mellitus;dyslipidemia;hypertension;male gender     Objective:    Today's Vitals   07/06/20 1600  Weight: 145 lb (65.8 kg)  Height: 5\' 6"  (1.676 m)   Body mass index is 23.4 kg/m.  Advanced Directives 07/06/2020 06/20/2018 07/14/2017 07/09/2017 07/07/2015 07/06/2015 06/05/2015  Does Patient Have a Medical Advance Directive? No No No Yes No No No  Type of Advance Directive - - - Healthcare Power of Attorney - - -  Does patient want to make changes to medical advance directive? - - No - Patient declined No - Patient declined - - -  Would patient like information on creating a medical advance directive? No - Patient declined No - Patient declined No - Patient declined - No - patient declined information - No - patient declined information    Current Medications (verified) Outpatient  Encounter Medications as of 07/06/2020  Medication Sig   allopurinol (ZYLOPRIM) 100 MG tablet Take 1 tablet (100 mg total) by mouth daily.   aspirin EC 81 MG tablet Take 81 mg by mouth daily.   atorvastatin (LIPITOR) 20 MG tablet Take 1 tablet (20 mg total) by mouth daily after supper.   cephALEXin (KEFLEX) 500 MG capsule Take 1 capsule (500 mg total) by mouth 2 (two) times daily.   Cholecalciferol (VITAMIN D3) 2000 units TABS Take 2,000 Units by mouth daily.   fish oil-omega-3 fatty acids 1000 MG capsule Take 1 g by mouth daily.   furosemide (LASIX) 40 MG tablet Take 1 tablet twice a day for 3 days, then resume 1 tablet daily.   glucose blood (ONE TOUCH ULTRA TEST) test strip USE TO CHECK GLUCOSE THREE TIMES DAILY   insulin glargine (LANTUS SOLOSTAR) 100 UNIT/ML Solostar Pen Inject 40 Units into the skin at bedtime. (NEEDS TO BE SEEN BEFORE NEXT REFILL)   Insulin Pen Needle (PEN NEEDLES) 32G X 4 MM MISC 1 each by Does not apply route 4 (four) times daily. Dx: E11.65   Lancets (ONETOUCH ULTRASOFT) lancets Test TId. DX E11.9   metFORMIN (GLUCOPHAGE) 500 MG tablet Take 1 tablet (500 mg total) by mouth 2 (two) times daily with a meal.   metoprolol succinate (TOPROL-XL) 25 MG 24 hr tablet Take 1 tablet (25 mg total) by mouth daily.   Probiotic Product (PROBIOTIC DAILY PO) Take 1 capsule by mouth daily.   No facility-administered encounter medications on file as of 07/06/2020.    Allergies (verified) Patient has no known  allergies.   History: Past Medical History:  Diagnosis Date   1st degree AV block    Atrial fibrillation (HCC)    CAD (coronary artery disease)    a. s/p CABG in 1980's (patient unsure of exact year or grafts)   Clostridium difficile colitis 08/14/2018   s/p Vanc 125mg  qid x 10 days with resolution   Diabetes mellitus without complication (HCC)    Gout    Hyperlipidemia    Hypertension    Neuropathy    Stroke [facility])    Past Surgical History:  Procedure Laterality Date    carpel tunnel Bilateral    CORONARY ARTERY BYPASS GRAFT     heart- x 6   KIDNEY STONE SURGERY     PARATHYROIDECTOMY     Family History  Problem Relation Age of Onset   Cancer Father        throat   Heart disease Father    CAD Brother 53       MI questionable, sudden death   CAD Daughter 55       MI, stent x 2    CAD Brother    Cancer Brother        lung    CAD Brother    CAD Brother    CAD Brother    CAD Brother    CAD Brother    Social History   Socioeconomic History   Marital status: Widowed    Spouse name: Not on file   Number of children: 1   Years of education: Not on file   Highest education level: Not on file  Occupational History   Occupation: retired     Comment: city of [place]   Tobacco Use   Smoking status: Former    Pack years: 0.00    Types: Cigarettes    Quit date: 06/12/1977    Years since quitting: 43.0   Smokeless tobacco: Never  Vaping Use   Vaping Use: Never used  Substance and Sexual Activity   Alcohol use: No   Drug use: No   Sexual activity: Not on file  Other Topics Concern   Not on file  Social History Narrative   He lives alone on one level. His daughter, 06/14/1977 lives 1/4 mile away. She prepares his medications for him and helps him when needed, but he is very independent.   Social Determinants of Health   Financial Resource Strain: Low Risk    Difficulty of Paying Living Expenses: Not hard at all  Food Insecurity: No Food Insecurity   Worried About Efraim Kaufmann in the Last Year: Never true   Ran Out of Food in the Last Year: Never true  Transportation Needs: No Transportation Needs   Lack of Transportation (Medical): No   Lack of Transportation (Non-Medical): No  Physical Activity: Insufficiently Active   Days of Exercise per Week: 7 days   Minutes of Exercise per Session: 20 min  Stress: No Stress Concern Present   Feeling of Stress : Not at all  Social Connections: Moderately Integrated   Frequency of  Communication with Friends and Family: More than three times a week   Frequency of Social Gatherings with Friends and Family: More than three times a week   Attends Religious Services: More than 4 times per year   Active Member of Programme researcher, broadcasting/film/video or Organizations: Yes   Attends [employer] Meetings: More than 4 times per year   Marital Status: Widowed    Tobacco Counseling Counseling  given: Not Answered   Clinical Intake:  Pre-visit preparation completed: Yes  Pain : No/denies pain     BMI - recorded: 23.4 Nutritional Status: BMI of 19-24  Normal Nutritional Risks: None Diabetes: Yes CBG done?: No Did pt. bring in CBG monitor from home?: No  How often do you need to have someone help you when you read instructions, pamphlets, or other written materials from your doctor or pharmacy?: 1 - Never  Nutrition Risk Assessment:  Has the patient had any N/V/D within the last 2 months?  No  Does the patient have any non-healing wounds?  No  Has the patient had any unintentional weight loss or weight gain?  No   Diabetes:  Is the patient diabetic?  Yes  If diabetic, was a CBG obtained today?  No  Did the patient bring in their glucometer from home?  No  How often do you monitor your CBG's? Not daily, just when he thinks about it.   Financial Strains and Diabetes Management:  Are you having any financial strains with the device, your supplies or your medication? No .  Does the patient want to be seen by Chronic Care Management for management of their diabetes?  No  Would the patient like to be referred to a Nutritionist or for Diabetic Management?  No   Diabetic Exams:  Diabetic Eye Exam: Completed 2017. Overdue for diabetic eye exam. Pt has been advised about the importance in completing this exam. He says he will make an appointment soon  Diabetic Foot Exam: Completed 07/01/20. Pt has been advised about the importance in completing this exam. Pt is scheduled for diabetic foot  exam on 10/06/20.    Interpreter Needed?: No  Information entered by :: Zyanya Glaza, LPN   Activities of Daily Living In your present state of health, do you have any difficulty performing the following activities: 07/06/2020  Hearing? N  Vision? N  Difficulty concentrating or making decisions? N  Walking or climbing stairs? N  Dressing or bathing? N  Doing errands, shopping? N  Preparing Food and eating ? N  Using the Toilet? N  In the past six months, have you accidently leaked urine? N  Do you have problems with loss of bowel control? N  Managing your Medications? N  Managing your Finances? N  Housekeeping or managing your Housekeeping? N  Some recent data might be hidden    Patient Care Team: Dettinger, Elige Radon, MD as PCP - General (Family Medicine) Rollene Rotunda, MD as PCP - Cardiology (Cardiology) Jena Gauss Gerrit Friends, MD as Consulting Physician (Gastroenterology) Danella Maiers, [facility] (Pharmacist)  Indicate any recent Medical Services you may have received from other than [facility] providers in the past year (date may be approximate).     Assessment:   This is a routine wellness examination for [facility].  Hearing/Vision screen Hearing Screening - Comments:: C/o mild hearing loss - declines hearing aids Vision Screening - Comments:: Wears eyeglasses - hasn't seen eye doctor in years  Dietary issues and exercise activities discussed: Current Exercise Habits: Home exercise routine, Type of exercise: walking, Time (Minutes): 30, Frequency (Times/Week): 7, Weekly Exercise (Minutes/Week): 210, Intensity: Mild, Exercise limited by: orthopedic condition(s);cardiac condition(s)   Goals Addressed             This Visit's Progress    stay active    On track      Depression Screen [facility] 2/9 Scores 07/06/2020 07/01/2020 06/29/2020 11/11/2019 06/26/2019 03/26/2019 12/22/2018  PHQ - 2  Score 0 0 0 0 0 0 0  PHQ- 9 Score 0 - 2 - - - -    Fall Risk Fall Risk  07/06/2020 07/01/2020 06/29/2020  11/11/2019 06/26/2019  Falls in the past year? 0 0 0 0 0  Number falls in past yr: 0 - - - -  Injury with Fall? 0 - - - -  Risk for fall due to : Orthopedic patient;Impaired vision - - - -  Follow up Falls prevention discussed - - - -    FALL RISK PREVENTION PERTAINING TO THE HOME:  Any stairs in or around the home? No  If so, are there any without handrails? No  Home free of loose throw rugs in walkways, pet beds, electrical cords, etc? Yes  Adequate lighting in your home to reduce risk of falls? Yes   ASSISTIVE DEVICES UTILIZED TO PREVENT FALLS:  Life alert? No  Use of a cane, walker or w/c? No  Grab bars in the bathroom? Yes  Shower chair or bench in shower? No  Elevated toilet seat or a handicapped toilet? No   TIMED UP AND GO:  Was the test performed? No . Telephonic visit.  Cognitive Function: Normal cognitive status assessed by direct observation by this Nurse Health [name]. No abnormalities found. (He refused memory test.)     6CIT Screen 06/20/2018  What Year? 0 points  What month? 0 points  What time? 0 points  Count back from 20 0 points  Months in reverse 0 points  Repeat phrase 4 points  Total Score 4    Immunizations Immunization History  Administered Date(s) Administered   Fluad Quad(high Dose 65+) 11/11/2019   Influenza, High Dose Seasonal PF 10/24/2015, 10/31/2017, 10/20/2018   Influenza,inj,Quad PF,6+ Mos 11/12/2012, 11/10/2013, 11/04/2014   Pneumococcal Conjugate-13 11/13/2012   Pneumococcal Polysaccharide-23 11/11/2019   Td 02/01/2010   Tdap 02/01/2010   Zoster, Live 05/19/2010    TDAP status: Due, Education has been provided regarding the importance of this vaccine. Advised may receive this vaccine at local pharmacy or Health Dept. Aware to provide a copy of the vaccination record if obtained from local pharmacy or Health Dept. Verbalized acceptance and understanding.  Flu Vaccine status: Up to date  Pneumococcal vaccine status: Up to  date  Covid-19 vaccine status: Completed vaccines we need dates  Qualifies for Shingles Vaccine? Yes   Zostavax completed Yes   Shingrix Completed?: No.    Education has been provided regarding the importance of this vaccine. Patient has been advised to call insurance company to determine out of pocket expense if they have not yet received this vaccine. Advised may also receive vaccine at local pharmacy or Health Dept. Verbalized acceptance and understanding.  Screening Tests Health Maintenance  Topic Date Due   OPHTHALMOLOGY EXAM  01/02/2021 (Originally 11/08/2016)   Zoster Vaccines- Shingrix (1 of 2) 01/02/2021 (Originally 02/05/1983)   TETANUS/TDAP  07/01/2021 (Originally 02/02/2020)   COVID-19 Vaccine (1) 07/15/2021 (Originally 02/04/1936)   INFLUENZA VACCINE  08/22/2020   HEMOGLOBIN A1C  12/31/2020   FOOT EXAM  07/01/2021   URINE MICROALBUMIN  07/01/2021   PNA vac Low Risk Adult  Completed   HPV VACCINES  Aged Out    Health Maintenance  There are no preventive care reminders to display for this patient.  Colorectal cancer screening: No longer required.   Lung Cancer Screening: (Low Dose CT Chest recommended if Age 97-80 years, 30 pack-year currently smoking OR have quit w/in 15years.) does not qualify.  Additional Screening:  Hepatitis C Screening: does not qualify  Vision Screening: Recommended annual ophthalmology exams for early detection of glaucoma and other disorders of the eye. Is the patient up to date with their annual eye exam?  No  Who is the provider or what is the name of the office in which the patient attends annual eye exams? none If pt is not established with a provider, would they like to be referred to a provider to establish care? No .   Dental Screening: Recommended annual dental exams for proper oral hygiene  Community Resource Referral / Chronic Care Management: CRR required this visit?  No   CCM required this visit?  No      Plan:     I  have personally reviewed and noted the following in the patient's chart:   Medical and social history Use of alcohol, tobacco or illicit drugs  Current medications and supplements including opioid prescriptions. Patient is not currently taking opioid prescriptions. Functional abilitNoney and status Nutritional status Physical activity Advanced directives List of other physicians Hospitalizations, surgeries, and ER visits in previous 12 months Vitals Screenings to include cognitive, depression, and falls Referrals and appointments  In addition, I have reviewed and discussed with patient certain preventive protocols, quality metrics, and best practice recommendations. A written personalized care plan for preventive services as well as general preventive health recommendations were provided to patient.     [place] Constable, LPN   [id number]   Nurse Notes: None

## 2020-07-14 ENCOUNTER — Other Ambulatory Visit: Payer: Self-pay

## 2020-07-14 MED ORDER — ATORVASTATIN CALCIUM 40 MG PO TABS: 40.0000 mg | ORAL_TABLET | Freq: Every evening | ORAL | 1 refills | Status: AC

## 2020-07-14 MED ORDER — LISINOPRIL 5 MG PO TABS: 5.0000 mg | ORAL_TABLET | Freq: Every day | ORAL | 1 refills | Status: AC

## 2020-08-09 ENCOUNTER — Ambulatory Visit (INDEPENDENT_AMBULATORY_CARE_PROVIDER_SITE_OTHER): Payer: Medicare Other | Admitting: Family Medicine

## 2020-08-09 ENCOUNTER — Other Ambulatory Visit: Payer: Self-pay | Admitting: Family Medicine

## 2020-08-09 ENCOUNTER — Encounter: Payer: Self-pay | Admitting: Family Medicine

## 2020-08-09 ENCOUNTER — Other Ambulatory Visit: Payer: Self-pay

## 2020-08-09 VITALS — BP 146/66 | HR 67 | Ht 66.0 in | Wt 121.0 lb

## 2020-08-09 DIAGNOSIS — F419 Anxiety disorder, unspecified: Secondary | ICD-10-CM

## 2020-08-09 DIAGNOSIS — R627 Adult failure to thrive: Secondary | ICD-10-CM

## 2020-08-09 DIAGNOSIS — I509 Heart failure, unspecified: Secondary | ICD-10-CM

## 2020-08-09 DIAGNOSIS — I5042 Chronic combined systolic (congestive) and diastolic (congestive) heart failure: Secondary | ICD-10-CM

## 2020-08-09 DIAGNOSIS — R600 Localized edema: Secondary | ICD-10-CM

## 2020-08-09 MED ORDER — SERTRALINE HCL 50 MG PO TABS: 50.0000 mg | ORAL_TABLET | Freq: Every day | ORAL | 5 refills | Status: AC
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
## 2020-08-16 ENCOUNTER — Telehealth: Payer: Self-pay | Admitting: Family Medicine

## 2020-08-16 DIAGNOSIS — I509 Heart failure, unspecified: Secondary | ICD-10-CM

## 2020-08-16 DIAGNOSIS — R600 Localized edema: Secondary | ICD-10-CM

## 2020-08-22 ENCOUNTER — Telehealth: Payer: Self-pay | Admitting: Family Medicine

## 2020-08-22 NOTE — Telephone Encounter (Signed)
Dr. Louanne Skye sent in a year worth of refills for Furosemide to [facility] in [place] on 07/01/20.  Tried calling pts daughter back but there was no answer and the vmail is full.

## 2020-08-22 NOTE — Telephone Encounter (Signed)
Pts daughter called to get update on if Dr Dettinger had received her message about pt needing refill on his furosemide Rx asap.  Daughter says its very important that pt gets refill asap because he needs this medicine and will be out in the next day or two.  Please call daughter to confirm if refill was called in.

## 2020-08-24 NOTE — Telephone Encounter (Signed)
According to my notes he was only supposed to increase to twice a day for 3 days and then go back to 1 tablet daily, if he needs a new prescription then please send some in for him but I do believe he is only supposed to go back to 1 tablet daily unless cardiology or somebody else told him something different

## 2020-08-25 MED ORDER — FUROSEMIDE 40 MG PO TABS: ORAL_TABLET | ORAL | 3 refills | Status: AC

## 2020-08-25 NOTE — Telephone Encounter (Signed)
Tried calling daughter, Efraim Kaufmann. No answer and no machine. Have sent in refills to [facility] in [place]. Just need to clarify how he is taken. He should be taking just one daily of the Lasix per Dettinger.

## 2020-08-25 NOTE — Addendum Note (Signed)
Addended by: Dorene Sorrow on: 08/25/2020 02:28 PM   Modules accepted: Orders

## 2020-10-06 ENCOUNTER — Ambulatory Visit: Payer: Medicare Other | Admitting: Family Medicine

## 2020-10-11 ENCOUNTER — Encounter: Payer: Self-pay | Admitting: Family Medicine

## 2021-03-22 DEATH — deceased

## 2021-07-07 ENCOUNTER — Ambulatory Visit: Payer: Medicare Other
